# Patient Record
Sex: Female | Born: 1970 | Race: White | Hispanic: No | Marital: Married | Smoking: Former smoker
Health system: Southern US, Community
[De-identification: ages and names within clinical notes are randomized; demographics above are authoritative.]

## PROBLEM LIST (undated history)

## (undated) DIAGNOSIS — T7840XA Allergy, unspecified, initial encounter: Secondary | ICD-10-CM

## (undated) DIAGNOSIS — R102 Pelvic and perineal pain: Secondary | ICD-10-CM

## (undated) DIAGNOSIS — F419 Anxiety disorder, unspecified: Secondary | ICD-10-CM

## (undated) DIAGNOSIS — G43909 Migraine, unspecified, not intractable, without status migrainosus: Secondary | ICD-10-CM

## (undated) DIAGNOSIS — K219 Gastro-esophageal reflux disease without esophagitis: Secondary | ICD-10-CM

## (undated) DIAGNOSIS — L659 Nonscarring hair loss, unspecified: Secondary | ICD-10-CM

## (undated) DIAGNOSIS — Z87442 Personal history of urinary calculi: Secondary | ICD-10-CM

## (undated) DIAGNOSIS — E7439 Other disorders of intestinal carbohydrate absorption: Secondary | ICD-10-CM

## (undated) DIAGNOSIS — K317 Polyp of stomach and duodenum: Secondary | ICD-10-CM

## (undated) DIAGNOSIS — K2900 Acute gastritis without bleeding: Secondary | ICD-10-CM

## (undated) DIAGNOSIS — I10 Essential (primary) hypertension: Secondary | ICD-10-CM

## (undated) DIAGNOSIS — Z87891 Personal history of nicotine dependence: Secondary | ICD-10-CM

## (undated) DIAGNOSIS — R05 Cough: Secondary | ICD-10-CM

## (undated) DIAGNOSIS — N3946 Mixed incontinence: Secondary | ICD-10-CM

## (undated) DIAGNOSIS — N901 Moderate vulvar dysplasia: Secondary | ICD-10-CM

## (undated) DIAGNOSIS — I8393 Asymptomatic varicose veins of bilateral lower extremities: Secondary | ICD-10-CM

## (undated) DIAGNOSIS — R569 Unspecified convulsions: Secondary | ICD-10-CM

## (undated) DIAGNOSIS — M791 Myalgia, unspecified site: Secondary | ICD-10-CM

## (undated) DIAGNOSIS — M67431 Ganglion, right wrist: Secondary | ICD-10-CM

## (undated) DIAGNOSIS — R002 Palpitations: Secondary | ICD-10-CM

## (undated) DIAGNOSIS — M1990 Unspecified osteoarthritis, unspecified site: Secondary | ICD-10-CM

## (undated) DIAGNOSIS — D259 Leiomyoma of uterus, unspecified: Secondary | ICD-10-CM

## (undated) DIAGNOSIS — D649 Anemia, unspecified: Secondary | ICD-10-CM

## (undated) DIAGNOSIS — L989 Disorder of the skin and subcutaneous tissue, unspecified: Secondary | ICD-10-CM

## (undated) HISTORY — DX: Pelvic and perineal pain: R10.2

## (undated) HISTORY — DX: Acute gastritis without bleeding: K29.00

## (undated) HISTORY — DX: Anxiety disorder, unspecified: F41.9

## (undated) HISTORY — PX: TUBAL LIGATION: SHX77

## (undated) HISTORY — DX: Migraine, unspecified, not intractable, without status migrainosus: G43.909

## (undated) HISTORY — DX: Nonscarring hair loss, unspecified: L65.9

## (undated) HISTORY — DX: Polyp of stomach and duodenum: K31.7

## (undated) HISTORY — DX: Unspecified osteoarthritis, unspecified site: M19.90

## (undated) HISTORY — DX: Gastro-esophageal reflux disease without esophagitis: K21.9

## (undated) HISTORY — DX: Essential (primary) hypertension: I10

## (undated) HISTORY — DX: Allergy, unspecified, initial encounter: T78.40XA

## (undated) HISTORY — PX: TONSILLECTOMY: SUR1361

## (undated) HISTORY — DX: Personal history of urinary calculi: Z87.442

## (undated) HISTORY — DX: Anemia, unspecified: D64.9

## (undated) HISTORY — DX: Personal history of nicotine dependence: Z87.891

## (undated) HISTORY — DX: Palpitations: R00.2

## (undated) HISTORY — DX: Myalgia, unspecified site: M79.10

## (undated) HISTORY — DX: Leiomyoma of uterus, unspecified: D25.9

## (undated) HISTORY — DX: Other disorders of intestinal carbohydrate absorption: E74.39

## (undated) HISTORY — DX: Mixed incontinence: N39.46

## (undated) HISTORY — DX: Asymptomatic varicose veins of bilateral lower extremities: I83.93

## (undated) HISTORY — DX: Moderate vulvar dysplasia: N90.1

## (undated) HISTORY — DX: Ganglion, right wrist: M67.431

## (undated) HISTORY — DX: Disorder of the skin and subcutaneous tissue, unspecified: L98.9

## (undated) HISTORY — DX: Unspecified convulsions: R56.9

## (undated) HISTORY — DX: Cough: R05

---

## 1998-03-16 ENCOUNTER — Encounter: Admission: RE | Admit: 1998-03-16 | Discharge: 1998-03-16 | Payer: Self-pay | Admitting: Family Medicine

## 1998-08-19 ENCOUNTER — Emergency Department (HOSPITAL_COMMUNITY): Admission: EM | Admit: 1998-08-19 | Discharge: 1998-08-19 | Payer: Self-pay | Admitting: Emergency Medicine

## 1998-11-07 ENCOUNTER — Encounter: Admission: RE | Admit: 1998-11-07 | Discharge: 1998-11-07 | Payer: Self-pay | Admitting: Family Medicine

## 1999-05-09 ENCOUNTER — Encounter: Admission: RE | Admit: 1999-05-09 | Discharge: 1999-05-09 | Payer: Self-pay | Admitting: Family Medicine

## 1999-05-22 ENCOUNTER — Encounter: Admission: RE | Admit: 1999-05-22 | Discharge: 1999-05-22 | Payer: Self-pay | Admitting: Family Medicine

## 1999-06-24 ENCOUNTER — Encounter: Admission: RE | Admit: 1999-06-24 | Discharge: 1999-06-24 | Payer: Self-pay | Admitting: Family Medicine

## 1999-08-13 ENCOUNTER — Encounter: Admission: RE | Admit: 1999-08-13 | Discharge: 1999-08-13 | Payer: Self-pay | Admitting: Sports Medicine

## 1999-09-04 ENCOUNTER — Encounter: Admission: RE | Admit: 1999-09-04 | Discharge: 1999-09-04 | Payer: Self-pay | Admitting: Family Medicine

## 2000-01-08 ENCOUNTER — Encounter: Admission: RE | Admit: 2000-01-08 | Discharge: 2000-01-08 | Payer: Self-pay | Admitting: Family Medicine

## 2000-01-08 ENCOUNTER — Encounter: Payer: Self-pay | Admitting: Sports Medicine

## 2000-01-08 ENCOUNTER — Encounter: Admission: RE | Admit: 2000-01-08 | Discharge: 2000-01-08 | Payer: Self-pay | Admitting: Sports Medicine

## 2000-01-15 ENCOUNTER — Encounter: Admission: RE | Admit: 2000-01-15 | Discharge: 2000-01-15 | Payer: Self-pay | Admitting: Family Medicine

## 2000-02-05 ENCOUNTER — Encounter: Admission: RE | Admit: 2000-02-05 | Discharge: 2000-02-05 | Payer: Self-pay | Admitting: Family Medicine

## 2000-02-05 ENCOUNTER — Other Ambulatory Visit: Admission: RE | Admit: 2000-02-05 | Discharge: 2000-02-05 | Payer: Self-pay | Admitting: Family Medicine

## 2000-02-26 ENCOUNTER — Encounter: Admission: RE | Admit: 2000-02-26 | Discharge: 2000-02-26 | Payer: Self-pay | Admitting: Family Medicine

## 2000-03-20 ENCOUNTER — Encounter: Admission: RE | Admit: 2000-03-20 | Discharge: 2000-03-20 | Payer: Self-pay

## 2000-03-25 ENCOUNTER — Encounter: Admission: RE | Admit: 2000-03-25 | Discharge: 2000-03-25 | Payer: Self-pay

## 2000-03-31 ENCOUNTER — Encounter: Admission: RE | Admit: 2000-03-31 | Discharge: 2000-03-31 | Payer: Self-pay | Admitting: Family Medicine

## 2000-04-23 ENCOUNTER — Encounter: Admission: RE | Admit: 2000-04-23 | Discharge: 2000-04-23 | Payer: Self-pay | Admitting: Family Medicine

## 2000-06-02 ENCOUNTER — Encounter: Admission: RE | Admit: 2000-06-02 | Discharge: 2000-06-02 | Payer: Self-pay | Admitting: Sports Medicine

## 2000-06-19 ENCOUNTER — Encounter: Admission: RE | Admit: 2000-06-19 | Discharge: 2000-06-19 | Payer: Self-pay | Admitting: Family Medicine

## 2000-09-02 ENCOUNTER — Encounter: Admission: RE | Admit: 2000-09-02 | Discharge: 2000-09-02 | Payer: Self-pay | Admitting: Family Medicine

## 2000-10-08 ENCOUNTER — Encounter: Payer: Self-pay | Admitting: Urology

## 2000-10-08 ENCOUNTER — Encounter: Admission: RE | Admit: 2000-10-08 | Discharge: 2000-10-08 | Payer: Self-pay | Admitting: Urology

## 2000-11-05 ENCOUNTER — Encounter: Admission: RE | Admit: 2000-11-05 | Discharge: 2000-11-05 | Payer: Self-pay | Admitting: Family Medicine

## 2000-11-06 ENCOUNTER — Encounter: Admission: RE | Admit: 2000-11-06 | Discharge: 2000-11-06 | Payer: Self-pay | Admitting: Family Medicine

## 2000-11-13 ENCOUNTER — Encounter: Admission: RE | Admit: 2000-11-13 | Discharge: 2000-11-13 | Payer: Self-pay | Admitting: Family Medicine

## 2000-11-13 ENCOUNTER — Encounter: Payer: Self-pay | Admitting: Sports Medicine

## 2000-11-13 ENCOUNTER — Encounter: Admission: RE | Admit: 2000-11-13 | Discharge: 2000-11-13 | Payer: Self-pay | Admitting: Sports Medicine

## 2000-11-26 ENCOUNTER — Encounter: Admission: RE | Admit: 2000-11-26 | Discharge: 2000-11-26 | Payer: Self-pay | Admitting: Family Medicine

## 2001-01-05 ENCOUNTER — Encounter: Admission: RE | Admit: 2001-01-05 | Discharge: 2001-01-05 | Payer: Self-pay | Admitting: Family Medicine

## 2001-01-12 ENCOUNTER — Encounter: Admission: RE | Admit: 2001-01-12 | Discharge: 2001-01-12 | Payer: Self-pay | Admitting: Family Medicine

## 2001-01-22 ENCOUNTER — Encounter: Admission: RE | Admit: 2001-01-22 | Discharge: 2001-01-22 | Payer: Self-pay | Admitting: Family Medicine

## 2001-01-26 ENCOUNTER — Encounter: Admission: RE | Admit: 2001-01-26 | Discharge: 2001-01-26 | Payer: Self-pay | Admitting: Family Medicine

## 2001-03-31 ENCOUNTER — Encounter: Admission: RE | Admit: 2001-03-31 | Discharge: 2001-03-31 | Payer: Self-pay | Admitting: Family Medicine

## 2001-03-31 ENCOUNTER — Other Ambulatory Visit: Admission: RE | Admit: 2001-03-31 | Discharge: 2001-03-31 | Payer: Self-pay | Admitting: Family Medicine

## 2001-04-29 ENCOUNTER — Encounter: Admission: RE | Admit: 2001-04-29 | Discharge: 2001-04-29 | Payer: Self-pay | Admitting: Obstetrics

## 2001-05-10 ENCOUNTER — Ambulatory Visit (HOSPITAL_COMMUNITY): Admission: RE | Admit: 2001-05-10 | Discharge: 2001-05-10 | Payer: Self-pay | Admitting: Obstetrics

## 2001-05-20 ENCOUNTER — Encounter: Admission: RE | Admit: 2001-05-20 | Discharge: 2001-05-20 | Payer: Self-pay | Admitting: Obstetrics

## 2001-08-19 ENCOUNTER — Encounter: Admission: RE | Admit: 2001-08-19 | Discharge: 2001-08-19 | Payer: Self-pay | Admitting: Obstetrics

## 2001-08-24 ENCOUNTER — Inpatient Hospital Stay (HOSPITAL_COMMUNITY): Admission: AD | Admit: 2001-08-24 | Discharge: 2001-08-24 | Payer: Self-pay | Admitting: *Deleted

## 2001-08-25 ENCOUNTER — Inpatient Hospital Stay (HOSPITAL_COMMUNITY): Admission: AD | Admit: 2001-08-25 | Discharge: 2001-08-25 | Payer: Self-pay | Admitting: Obstetrics

## 2001-08-26 ENCOUNTER — Inpatient Hospital Stay (HOSPITAL_COMMUNITY): Admission: RE | Admit: 2001-08-26 | Discharge: 2001-08-26 | Payer: Self-pay | Admitting: Obstetrics

## 2001-08-26 ENCOUNTER — Encounter: Payer: Self-pay | Admitting: Obstetrics

## 2001-09-01 ENCOUNTER — Inpatient Hospital Stay (HOSPITAL_COMMUNITY): Admission: AD | Admit: 2001-09-01 | Discharge: 2001-09-01 | Payer: Self-pay | Admitting: Obstetrics

## 2001-10-28 ENCOUNTER — Encounter: Admission: RE | Admit: 2001-10-28 | Discharge: 2001-10-28 | Payer: Self-pay | Admitting: Obstetrics

## 2001-11-01 ENCOUNTER — Encounter: Admission: RE | Admit: 2001-11-01 | Discharge: 2001-11-01 | Payer: Self-pay | Admitting: Internal Medicine

## 2001-11-01 ENCOUNTER — Encounter: Payer: Self-pay | Admitting: Internal Medicine

## 2001-11-01 ENCOUNTER — Ambulatory Visit (HOSPITAL_COMMUNITY): Admission: RE | Admit: 2001-11-01 | Discharge: 2001-11-01 | Payer: Self-pay | Admitting: Internal Medicine

## 2001-11-03 ENCOUNTER — Encounter: Payer: Self-pay | Admitting: Emergency Medicine

## 2001-11-03 ENCOUNTER — Emergency Department (HOSPITAL_COMMUNITY): Admission: EM | Admit: 2001-11-03 | Discharge: 2001-11-04 | Payer: Self-pay

## 2001-11-05 ENCOUNTER — Encounter: Admission: RE | Admit: 2001-11-05 | Discharge: 2001-11-05 | Payer: Self-pay | Admitting: Internal Medicine

## 2001-12-28 ENCOUNTER — Encounter: Admission: RE | Admit: 2001-12-28 | Discharge: 2001-12-28 | Payer: Self-pay | Admitting: Family Medicine

## 2002-05-05 ENCOUNTER — Encounter: Admission: RE | Admit: 2002-05-05 | Discharge: 2002-05-05 | Payer: Self-pay | Admitting: Family Medicine

## 2002-08-12 ENCOUNTER — Encounter: Admission: RE | Admit: 2002-08-12 | Discharge: 2002-08-12 | Payer: Self-pay | Admitting: Family Medicine

## 2002-08-19 ENCOUNTER — Encounter: Payer: Self-pay | Admitting: Emergency Medicine

## 2002-08-19 ENCOUNTER — Emergency Department (HOSPITAL_COMMUNITY): Admission: EM | Admit: 2002-08-19 | Discharge: 2002-08-19 | Payer: Self-pay | Admitting: Emergency Medicine

## 2002-11-08 ENCOUNTER — Encounter: Admission: RE | Admit: 2002-11-08 | Discharge: 2002-11-08 | Payer: Self-pay | Admitting: Family Medicine

## 2002-11-25 ENCOUNTER — Encounter: Admission: RE | Admit: 2002-11-25 | Discharge: 2002-11-25 | Payer: Self-pay | Admitting: Family Medicine

## 2002-12-01 ENCOUNTER — Encounter: Admission: RE | Admit: 2002-12-01 | Discharge: 2002-12-01 | Payer: Self-pay | Admitting: Family Medicine

## 2003-01-29 ENCOUNTER — Encounter: Payer: Self-pay | Admitting: Emergency Medicine

## 2003-01-29 ENCOUNTER — Emergency Department (HOSPITAL_COMMUNITY): Admission: EM | Admit: 2003-01-29 | Discharge: 2003-01-29 | Payer: Self-pay | Admitting: Emergency Medicine

## 2003-02-01 ENCOUNTER — Encounter: Admission: RE | Admit: 2003-02-01 | Discharge: 2003-02-01 | Payer: Self-pay | Admitting: Family Medicine

## 2003-02-17 ENCOUNTER — Ambulatory Visit (HOSPITAL_COMMUNITY): Admission: RE | Admit: 2003-02-17 | Discharge: 2003-02-17 | Payer: Self-pay | Admitting: *Deleted

## 2003-02-17 ENCOUNTER — Encounter: Payer: Self-pay | Admitting: *Deleted

## 2003-02-17 ENCOUNTER — Encounter: Admission: RE | Admit: 2003-02-17 | Discharge: 2003-02-17 | Payer: Self-pay | Admitting: Family Medicine

## 2003-04-03 ENCOUNTER — Encounter: Admission: RE | Admit: 2003-04-03 | Discharge: 2003-04-03 | Payer: Self-pay | Admitting: Sports Medicine

## 2003-07-07 ENCOUNTER — Encounter: Admission: RE | Admit: 2003-07-07 | Discharge: 2003-07-07 | Payer: Self-pay | Admitting: Sports Medicine

## 2003-07-30 ENCOUNTER — Encounter: Payer: Self-pay | Admitting: *Deleted

## 2003-07-30 ENCOUNTER — Inpatient Hospital Stay (HOSPITAL_COMMUNITY): Admission: AD | Admit: 2003-07-30 | Discharge: 2003-07-30 | Payer: Self-pay | Admitting: *Deleted

## 2003-08-03 ENCOUNTER — Encounter: Admission: RE | Admit: 2003-08-03 | Discharge: 2003-08-03 | Payer: Self-pay | Admitting: Family Medicine

## 2003-08-14 ENCOUNTER — Encounter: Admission: RE | Admit: 2003-08-14 | Discharge: 2003-08-14 | Payer: Self-pay | Admitting: Family Medicine

## 2003-08-21 ENCOUNTER — Encounter: Admission: RE | Admit: 2003-08-21 | Discharge: 2003-08-21 | Payer: Self-pay | Admitting: Family Medicine

## 2003-08-24 ENCOUNTER — Ambulatory Visit (HOSPITAL_COMMUNITY): Admission: RE | Admit: 2003-08-24 | Discharge: 2003-08-24 | Payer: Self-pay | Admitting: *Deleted

## 2003-09-07 ENCOUNTER — Inpatient Hospital Stay (HOSPITAL_COMMUNITY): Admission: AD | Admit: 2003-09-07 | Discharge: 2003-09-07 | Payer: Self-pay | Admitting: Obstetrics and Gynecology

## 2003-09-19 ENCOUNTER — Encounter: Admission: RE | Admit: 2003-09-19 | Discharge: 2003-09-19 | Payer: Self-pay | Admitting: Family Medicine

## 2003-10-04 ENCOUNTER — Encounter: Admission: RE | Admit: 2003-10-04 | Discharge: 2003-10-04 | Payer: Self-pay | Admitting: *Deleted

## 2003-10-07 DIAGNOSIS — N901 Moderate vulvar dysplasia: Secondary | ICD-10-CM

## 2003-10-07 HISTORY — DX: Moderate vulvar dysplasia: N90.1

## 2003-10-18 ENCOUNTER — Inpatient Hospital Stay (HOSPITAL_COMMUNITY): Admission: AD | Admit: 2003-10-18 | Discharge: 2003-10-18 | Payer: Self-pay | Admitting: Obstetrics and Gynecology

## 2003-10-19 ENCOUNTER — Encounter: Admission: RE | Admit: 2003-10-19 | Discharge: 2003-10-19 | Payer: Self-pay | Admitting: *Deleted

## 2003-10-25 ENCOUNTER — Ambulatory Visit: Admission: RE | Admit: 2003-10-25 | Discharge: 2003-10-25 | Payer: Self-pay | Admitting: Gynecologic Oncology

## 2003-11-02 ENCOUNTER — Ambulatory Visit (HOSPITAL_COMMUNITY): Admission: RE | Admit: 2003-11-02 | Discharge: 2003-11-02 | Payer: Self-pay | Admitting: *Deleted

## 2003-11-06 ENCOUNTER — Inpatient Hospital Stay (HOSPITAL_COMMUNITY): Admission: AD | Admit: 2003-11-06 | Discharge: 2003-11-07 | Payer: Self-pay | Admitting: Obstetrics & Gynecology

## 2003-11-09 ENCOUNTER — Encounter: Admission: RE | Admit: 2003-11-09 | Discharge: 2003-11-09 | Payer: Self-pay | Admitting: Family Medicine

## 2003-11-16 ENCOUNTER — Encounter: Admission: RE | Admit: 2003-11-16 | Discharge: 2003-11-16 | Payer: Self-pay | Admitting: *Deleted

## 2003-11-23 ENCOUNTER — Inpatient Hospital Stay (HOSPITAL_COMMUNITY): Admission: AD | Admit: 2003-11-23 | Discharge: 2003-11-23 | Payer: Self-pay | Admitting: Obstetrics & Gynecology

## 2003-11-26 ENCOUNTER — Inpatient Hospital Stay (HOSPITAL_COMMUNITY): Admission: AD | Admit: 2003-11-26 | Discharge: 2003-11-26 | Payer: Self-pay | Admitting: Obstetrics & Gynecology

## 2003-11-30 ENCOUNTER — Encounter: Admission: RE | Admit: 2003-11-30 | Discharge: 2003-11-30 | Payer: Self-pay | Admitting: *Deleted

## 2003-12-05 ENCOUNTER — Ambulatory Visit (HOSPITAL_COMMUNITY): Admission: RE | Admit: 2003-12-05 | Discharge: 2003-12-05 | Payer: Self-pay | Admitting: Family Medicine

## 2003-12-14 ENCOUNTER — Encounter: Admission: RE | Admit: 2003-12-14 | Discharge: 2003-12-14 | Payer: Self-pay | Admitting: *Deleted

## 2003-12-17 ENCOUNTER — Inpatient Hospital Stay (HOSPITAL_COMMUNITY): Admission: AD | Admit: 2003-12-17 | Discharge: 2003-12-17 | Payer: Self-pay | Admitting: Obstetrics & Gynecology

## 2003-12-20 ENCOUNTER — Ambulatory Visit: Admission: RE | Admit: 2003-12-20 | Discharge: 2003-12-20 | Payer: Self-pay | Admitting: Gynecologic Oncology

## 2003-12-21 ENCOUNTER — Encounter: Admission: RE | Admit: 2003-12-21 | Discharge: 2003-12-21 | Payer: Self-pay | Admitting: Family Medicine

## 2004-01-04 ENCOUNTER — Encounter: Admission: RE | Admit: 2004-01-04 | Discharge: 2004-01-04 | Payer: Self-pay | Admitting: Family Medicine

## 2004-01-04 ENCOUNTER — Ambulatory Visit (HOSPITAL_COMMUNITY): Admission: RE | Admit: 2004-01-04 | Discharge: 2004-01-04 | Payer: Self-pay | Admitting: Family Medicine

## 2004-01-11 ENCOUNTER — Encounter: Admission: RE | Admit: 2004-01-11 | Discharge: 2004-01-11 | Payer: Self-pay | Admitting: Family Medicine

## 2004-01-16 ENCOUNTER — Encounter: Admission: RE | Admit: 2004-01-16 | Discharge: 2004-04-15 | Payer: Self-pay | Admitting: *Deleted

## 2004-01-16 ENCOUNTER — Inpatient Hospital Stay (HOSPITAL_COMMUNITY): Admission: AD | Admit: 2004-01-16 | Discharge: 2004-01-16 | Payer: Self-pay | Admitting: *Deleted

## 2004-01-18 ENCOUNTER — Encounter: Admission: RE | Admit: 2004-01-18 | Discharge: 2004-01-18 | Payer: Self-pay | Admitting: Family Medicine

## 2004-01-25 ENCOUNTER — Encounter: Admission: RE | Admit: 2004-01-25 | Discharge: 2004-01-25 | Payer: Self-pay | Admitting: *Deleted

## 2004-01-29 ENCOUNTER — Inpatient Hospital Stay (HOSPITAL_COMMUNITY): Admission: AD | Admit: 2004-01-29 | Discharge: 2004-01-30 | Payer: Self-pay | Admitting: Obstetrics and Gynecology

## 2004-02-01 ENCOUNTER — Encounter: Admission: RE | Admit: 2004-02-01 | Discharge: 2004-02-01 | Payer: Self-pay | Admitting: *Deleted

## 2004-02-05 ENCOUNTER — Ambulatory Visit (HOSPITAL_COMMUNITY): Admission: RE | Admit: 2004-02-05 | Discharge: 2004-02-05 | Payer: Self-pay | Admitting: *Deleted

## 2004-02-06 ENCOUNTER — Inpatient Hospital Stay (HOSPITAL_COMMUNITY): Admission: AD | Admit: 2004-02-06 | Discharge: 2004-02-06 | Payer: Self-pay | Admitting: *Deleted

## 2004-02-08 ENCOUNTER — Encounter: Admission: RE | Admit: 2004-02-08 | Discharge: 2004-02-08 | Payer: Self-pay | Admitting: Family Medicine

## 2004-02-15 ENCOUNTER — Encounter: Admission: RE | Admit: 2004-02-15 | Discharge: 2004-02-15 | Payer: Self-pay | Admitting: Family Medicine

## 2004-02-16 ENCOUNTER — Inpatient Hospital Stay (HOSPITAL_COMMUNITY): Admission: AD | Admit: 2004-02-16 | Discharge: 2004-02-17 | Payer: Self-pay | Admitting: *Deleted

## 2004-02-22 ENCOUNTER — Encounter: Admission: RE | Admit: 2004-02-22 | Discharge: 2004-02-22 | Payer: Self-pay | Admitting: Family Medicine

## 2004-02-29 ENCOUNTER — Encounter: Admission: RE | Admit: 2004-02-29 | Discharge: 2004-02-29 | Payer: Self-pay | Admitting: Family Medicine

## 2004-03-04 ENCOUNTER — Inpatient Hospital Stay (HOSPITAL_COMMUNITY): Admission: AD | Admit: 2004-03-04 | Discharge: 2004-03-10 | Payer: Self-pay | Admitting: Obstetrics and Gynecology

## 2004-03-07 ENCOUNTER — Encounter (INDEPENDENT_AMBULATORY_CARE_PROVIDER_SITE_OTHER): Payer: Self-pay | Admitting: Specialist

## 2004-03-12 ENCOUNTER — Inpatient Hospital Stay (HOSPITAL_COMMUNITY): Admission: AD | Admit: 2004-03-12 | Discharge: 2004-03-12 | Payer: Self-pay | Admitting: *Deleted

## 2004-03-19 ENCOUNTER — Encounter: Admission: RE | Admit: 2004-03-19 | Discharge: 2004-03-19 | Payer: Self-pay | Admitting: Family Medicine

## 2004-04-16 ENCOUNTER — Ambulatory Visit: Admission: RE | Admit: 2004-04-16 | Discharge: 2004-04-16 | Payer: Self-pay | Admitting: Gynecology

## 2004-04-24 ENCOUNTER — Encounter: Admission: RE | Admit: 2004-04-24 | Discharge: 2004-04-24 | Payer: Self-pay | Admitting: Family Medicine

## 2004-06-11 ENCOUNTER — Ambulatory Visit: Admission: RE | Admit: 2004-06-11 | Discharge: 2004-06-11 | Payer: Self-pay | Admitting: Gynecologic Oncology

## 2004-06-26 ENCOUNTER — Ambulatory Visit: Payer: Self-pay | Admitting: Family Medicine

## 2004-06-28 ENCOUNTER — Ambulatory Visit (HOSPITAL_COMMUNITY): Admission: RE | Admit: 2004-06-28 | Discharge: 2004-06-28 | Payer: Self-pay | Admitting: Sports Medicine

## 2004-07-08 ENCOUNTER — Ambulatory Visit (HOSPITAL_COMMUNITY): Admission: RE | Admit: 2004-07-08 | Discharge: 2004-07-08 | Payer: Self-pay | Admitting: Gynecologic Oncology

## 2004-07-08 ENCOUNTER — Encounter (INDEPENDENT_AMBULATORY_CARE_PROVIDER_SITE_OTHER): Payer: Self-pay | Admitting: *Deleted

## 2004-07-22 ENCOUNTER — Ambulatory Visit: Payer: Self-pay | Admitting: Sports Medicine

## 2004-08-06 ENCOUNTER — Ambulatory Visit: Admission: RE | Admit: 2004-08-06 | Discharge: 2004-08-06 | Payer: Self-pay | Admitting: Gynecologic Oncology

## 2004-08-06 HISTORY — PX: VULVECTOMY PARTIAL: SHX6187

## 2004-09-20 ENCOUNTER — Ambulatory Visit: Payer: Self-pay | Admitting: Family Medicine

## 2004-10-08 ENCOUNTER — Ambulatory Visit: Payer: Self-pay | Admitting: Family Medicine

## 2004-10-21 ENCOUNTER — Ambulatory Visit: Payer: Self-pay | Admitting: Sports Medicine

## 2004-12-11 ENCOUNTER — Ambulatory Visit: Payer: Self-pay | Admitting: Family Medicine

## 2005-02-21 ENCOUNTER — Encounter: Admission: RE | Admit: 2005-02-21 | Discharge: 2005-02-21 | Payer: Self-pay | Admitting: Sports Medicine

## 2005-02-21 ENCOUNTER — Ambulatory Visit: Payer: Self-pay | Admitting: Family Medicine

## 2005-03-10 ENCOUNTER — Ambulatory Visit: Payer: Self-pay | Admitting: Sports Medicine

## 2005-05-01 ENCOUNTER — Ambulatory Visit: Payer: Self-pay | Admitting: Family Medicine

## 2005-05-22 ENCOUNTER — Encounter (INDEPENDENT_AMBULATORY_CARE_PROVIDER_SITE_OTHER): Payer: Self-pay | Admitting: Specialist

## 2005-05-22 ENCOUNTER — Ambulatory Visit: Payer: Self-pay | Admitting: Family Medicine

## 2005-06-19 ENCOUNTER — Ambulatory Visit: Payer: Self-pay | Admitting: Family Medicine

## 2005-07-21 ENCOUNTER — Ambulatory Visit: Payer: Self-pay | Admitting: Family Medicine

## 2005-09-10 ENCOUNTER — Ambulatory Visit: Payer: Self-pay | Admitting: Family Medicine

## 2005-12-04 ENCOUNTER — Encounter (INDEPENDENT_AMBULATORY_CARE_PROVIDER_SITE_OTHER): Payer: Self-pay | Admitting: *Deleted

## 2005-12-04 LAB — CONVERTED CEMR LAB

## 2005-12-15 ENCOUNTER — Encounter: Payer: Self-pay | Admitting: Family Medicine

## 2005-12-15 ENCOUNTER — Ambulatory Visit: Payer: Self-pay | Admitting: Family Medicine

## 2006-01-14 ENCOUNTER — Encounter: Admission: RE | Admit: 2006-01-14 | Discharge: 2006-01-14 | Payer: Self-pay | Admitting: Gastroenterology

## 2006-01-19 ENCOUNTER — Ambulatory Visit: Payer: Self-pay | Admitting: Family Medicine

## 2006-01-23 ENCOUNTER — Encounter: Payer: Self-pay | Admitting: Family Medicine

## 2006-02-05 ENCOUNTER — Encounter: Payer: Self-pay | Admitting: Family Medicine

## 2006-02-12 ENCOUNTER — Ambulatory Visit: Payer: Self-pay | Admitting: Family Medicine

## 2006-02-17 ENCOUNTER — Encounter: Admission: RE | Admit: 2006-02-17 | Discharge: 2006-02-17 | Payer: Self-pay | Admitting: Family Medicine

## 2006-03-19 ENCOUNTER — Ambulatory Visit: Payer: Self-pay | Admitting: Family Medicine

## 2006-03-23 ENCOUNTER — Encounter: Admission: RE | Admit: 2006-03-23 | Discharge: 2006-03-23 | Payer: Self-pay | Admitting: Sports Medicine

## 2006-04-16 ENCOUNTER — Ambulatory Visit: Payer: Self-pay | Admitting: Family Medicine

## 2006-06-15 ENCOUNTER — Ambulatory Visit: Payer: Self-pay | Admitting: Family Medicine

## 2006-07-13 ENCOUNTER — Ambulatory Visit: Payer: Self-pay | Admitting: Family Medicine

## 2006-08-13 ENCOUNTER — Ambulatory Visit: Payer: Self-pay | Admitting: Family Medicine

## 2006-10-12 ENCOUNTER — Ambulatory Visit: Payer: Self-pay | Admitting: Family Medicine

## 2006-12-03 DIAGNOSIS — K219 Gastro-esophageal reflux disease without esophagitis: Secondary | ICD-10-CM

## 2006-12-03 DIAGNOSIS — Z87442 Personal history of urinary calculi: Secondary | ICD-10-CM | POA: Insufficient documentation

## 2006-12-03 DIAGNOSIS — J309 Allergic rhinitis, unspecified: Secondary | ICD-10-CM | POA: Insufficient documentation

## 2006-12-03 DIAGNOSIS — E7439 Other disorders of intestinal carbohydrate absorption: Secondary | ICD-10-CM

## 2006-12-03 DIAGNOSIS — N920 Excessive and frequent menstruation with regular cycle: Secondary | ICD-10-CM | POA: Insufficient documentation

## 2006-12-03 DIAGNOSIS — G43909 Migraine, unspecified, not intractable, without status migrainosus: Secondary | ICD-10-CM | POA: Insufficient documentation

## 2006-12-03 DIAGNOSIS — R8789 Other abnormal findings in specimens from female genital organs: Secondary | ICD-10-CM | POA: Insufficient documentation

## 2006-12-03 DIAGNOSIS — I10 Essential (primary) hypertension: Secondary | ICD-10-CM | POA: Insufficient documentation

## 2006-12-03 HISTORY — DX: Gastro-esophageal reflux disease without esophagitis: K21.9

## 2006-12-03 HISTORY — DX: Other disorders of intestinal carbohydrate absorption: E74.39

## 2006-12-03 HISTORY — DX: Migraine, unspecified, not intractable, without status migrainosus: G43.909

## 2006-12-03 HISTORY — DX: Personal history of urinary calculi: Z87.442

## 2006-12-04 ENCOUNTER — Encounter (INDEPENDENT_AMBULATORY_CARE_PROVIDER_SITE_OTHER): Payer: Self-pay | Admitting: *Deleted

## 2007-01-11 ENCOUNTER — Telehealth (INDEPENDENT_AMBULATORY_CARE_PROVIDER_SITE_OTHER): Payer: Self-pay | Admitting: *Deleted

## 2007-01-11 ENCOUNTER — Emergency Department (HOSPITAL_COMMUNITY): Admission: EM | Admit: 2007-01-11 | Discharge: 2007-01-12 | Payer: Self-pay | Admitting: Emergency Medicine

## 2007-01-11 ENCOUNTER — Ambulatory Visit: Payer: Self-pay | Admitting: Sports Medicine

## 2007-01-12 ENCOUNTER — Telehealth: Payer: Self-pay | Admitting: Family Medicine

## 2007-01-14 ENCOUNTER — Ambulatory Visit: Payer: Self-pay | Admitting: Family Medicine

## 2007-02-04 ENCOUNTER — Telehealth: Payer: Self-pay | Admitting: *Deleted

## 2007-02-04 ENCOUNTER — Ambulatory Visit: Payer: Self-pay | Admitting: Family Medicine

## 2007-02-04 ENCOUNTER — Other Ambulatory Visit: Admission: RE | Admit: 2007-02-04 | Discharge: 2007-02-04 | Payer: Self-pay | Admitting: Family Medicine

## 2007-02-04 ENCOUNTER — Encounter: Payer: Self-pay | Admitting: Family Medicine

## 2007-02-04 ENCOUNTER — Encounter (INDEPENDENT_AMBULATORY_CARE_PROVIDER_SITE_OTHER): Payer: Self-pay | Admitting: Specialist

## 2007-02-05 ENCOUNTER — Ambulatory Visit (HOSPITAL_COMMUNITY): Admission: RE | Admit: 2007-02-05 | Discharge: 2007-02-05 | Payer: Self-pay | Admitting: Family Medicine

## 2007-02-08 ENCOUNTER — Encounter: Payer: Self-pay | Admitting: Family Medicine

## 2007-02-08 DIAGNOSIS — D259 Leiomyoma of uterus, unspecified: Secondary | ICD-10-CM | POA: Insufficient documentation

## 2007-02-08 HISTORY — DX: Leiomyoma of uterus, unspecified: D25.9

## 2007-03-25 ENCOUNTER — Telehealth (INDEPENDENT_AMBULATORY_CARE_PROVIDER_SITE_OTHER): Payer: Self-pay | Admitting: *Deleted

## 2007-03-25 ENCOUNTER — Other Ambulatory Visit: Admission: RE | Admit: 2007-03-25 | Discharge: 2007-03-25 | Payer: Self-pay | Admitting: Obstetrics & Gynecology

## 2007-03-29 ENCOUNTER — Encounter: Payer: Self-pay | Admitting: Family Medicine

## 2007-04-13 ENCOUNTER — Encounter: Payer: Self-pay | Admitting: Family Medicine

## 2007-07-19 ENCOUNTER — Encounter: Payer: Self-pay | Admitting: Family Medicine

## 2007-07-20 ENCOUNTER — Telehealth: Payer: Self-pay | Admitting: Family Medicine

## 2007-08-04 ENCOUNTER — Encounter: Payer: Self-pay | Admitting: *Deleted

## 2007-08-04 ENCOUNTER — Ambulatory Visit: Payer: Self-pay | Admitting: Family Medicine

## 2007-08-09 ENCOUNTER — Encounter: Payer: Self-pay | Admitting: Family Medicine

## 2007-09-10 ENCOUNTER — Telehealth: Payer: Self-pay | Admitting: *Deleted

## 2007-09-10 ENCOUNTER — Ambulatory Visit: Payer: Self-pay | Admitting: Family Medicine

## 2007-11-21 ENCOUNTER — Telehealth (INDEPENDENT_AMBULATORY_CARE_PROVIDER_SITE_OTHER): Payer: Self-pay | Admitting: *Deleted

## 2008-01-20 ENCOUNTER — Telehealth: Payer: Self-pay | Admitting: *Deleted

## 2008-02-15 ENCOUNTER — Ambulatory Visit: Payer: Self-pay | Admitting: Family Medicine

## 2008-02-15 ENCOUNTER — Telehealth: Payer: Self-pay | Admitting: *Deleted

## 2008-02-15 DIAGNOSIS — R1032 Left lower quadrant pain: Secondary | ICD-10-CM | POA: Insufficient documentation

## 2008-02-15 LAB — CONVERTED CEMR LAB
Nitrite: NEGATIVE
Protein, U semiquant: NEGATIVE
Specific Gravity, Urine: 1.025
Urobilinogen, UA: 0.2
WBC Urine, dipstick: NEGATIVE

## 2008-03-06 ENCOUNTER — Ambulatory Visit: Payer: Self-pay | Admitting: Family Medicine

## 2008-03-06 LAB — CONVERTED CEMR LAB: Beta hcg, urine, semiquantitative: NEGATIVE

## 2008-06-30 ENCOUNTER — Ambulatory Visit: Payer: Self-pay | Admitting: Family Medicine

## 2008-07-31 ENCOUNTER — Ambulatory Visit (HOSPITAL_COMMUNITY): Admission: RE | Admit: 2008-07-31 | Discharge: 2008-07-31 | Payer: Self-pay | Admitting: Family Medicine

## 2008-07-31 ENCOUNTER — Ambulatory Visit: Payer: Self-pay | Admitting: Family Medicine

## 2008-07-31 DIAGNOSIS — R002 Palpitations: Secondary | ICD-10-CM | POA: Insufficient documentation

## 2008-07-31 HISTORY — DX: Palpitations: R00.2

## 2008-07-31 LAB — CONVERTED CEMR LAB
ALT: 17 units/L (ref 0–35)
Alkaline Phosphatase: 97 units/L (ref 39–117)
CO2: 23 meq/L (ref 19–32)
Cholesterol: 167 mg/dL (ref 0–200)
LDL Cholesterol: 91 mg/dL (ref 0–99)
MCV: 87.6 fL (ref 78.0–100.0)
Platelets: 299 10*3/uL (ref 150–400)
Sodium: 139 meq/L (ref 135–145)
Total Bilirubin: 0.6 mg/dL (ref 0.3–1.2)
Total Protein: 7 g/dL (ref 6.0–8.3)
VLDL: 27 mg/dL (ref 0–40)
WBC: 8.5 10*3/uL (ref 4.0–10.5)

## 2008-08-01 ENCOUNTER — Telehealth: Payer: Self-pay | Admitting: Family Medicine

## 2008-08-01 ENCOUNTER — Encounter: Payer: Self-pay | Admitting: Family Medicine

## 2008-08-04 ENCOUNTER — Encounter: Payer: Self-pay | Admitting: Family Medicine

## 2008-08-07 ENCOUNTER — Ambulatory Visit: Payer: Self-pay | Admitting: Family Medicine

## 2008-08-10 ENCOUNTER — Encounter: Payer: Self-pay | Admitting: Family Medicine

## 2008-08-17 ENCOUNTER — Ambulatory Visit: Payer: Self-pay | Admitting: Family Medicine

## 2008-09-18 ENCOUNTER — Encounter: Payer: Self-pay | Admitting: Family Medicine

## 2008-09-21 ENCOUNTER — Encounter: Payer: Self-pay | Admitting: Family Medicine

## 2008-11-02 ENCOUNTER — Encounter: Payer: Self-pay | Admitting: Obstetrics & Gynecology

## 2008-11-02 ENCOUNTER — Ambulatory Visit: Payer: Self-pay | Admitting: Obstetrics and Gynecology

## 2008-11-02 ENCOUNTER — Encounter: Payer: Self-pay | Admitting: Obstetrics and Gynecology

## 2008-11-02 LAB — CONVERTED CEMR LAB
RBC: 5.26 M/uL — ABNORMAL HIGH (ref 3.87–5.11)
WBC: 11.9 10*3/uL — ABNORMAL HIGH (ref 4.0–10.5)

## 2008-11-08 ENCOUNTER — Encounter: Payer: Self-pay | Admitting: Family Medicine

## 2008-12-15 ENCOUNTER — Ambulatory Visit (HOSPITAL_COMMUNITY): Admission: RE | Admit: 2008-12-15 | Discharge: 2008-12-15 | Payer: Self-pay | Admitting: Family Medicine

## 2008-12-15 ENCOUNTER — Telehealth (INDEPENDENT_AMBULATORY_CARE_PROVIDER_SITE_OTHER): Payer: Self-pay | Admitting: *Deleted

## 2008-12-15 ENCOUNTER — Telehealth (INDEPENDENT_AMBULATORY_CARE_PROVIDER_SITE_OTHER): Payer: Self-pay | Admitting: Family Medicine

## 2008-12-15 ENCOUNTER — Telehealth: Payer: Self-pay | Admitting: Family Medicine

## 2008-12-15 ENCOUNTER — Ambulatory Visit: Payer: Self-pay | Admitting: Family Medicine

## 2008-12-27 ENCOUNTER — Encounter: Payer: Self-pay | Admitting: Family Medicine

## 2009-05-09 ENCOUNTER — Emergency Department (HOSPITAL_COMMUNITY): Admission: EM | Admit: 2009-05-09 | Discharge: 2009-05-10 | Payer: Self-pay | Admitting: Emergency Medicine

## 2009-05-11 ENCOUNTER — Encounter: Payer: Self-pay | Admitting: Family Medicine

## 2009-05-11 LAB — CONVERTED CEMR LAB
Hemoglobin: 14.2 g/dL
Potassium: 4.1 meq/L
Sodium: 136 meq/L
WBC: 9.8 10*3/uL

## 2009-05-22 ENCOUNTER — Telehealth: Payer: Self-pay | Admitting: Family Medicine

## 2009-07-09 ENCOUNTER — Ambulatory Visit: Payer: Self-pay | Admitting: Family Medicine

## 2009-08-23 ENCOUNTER — Emergency Department (HOSPITAL_COMMUNITY): Admission: EM | Admit: 2009-08-23 | Discharge: 2009-08-23 | Payer: Self-pay | Admitting: Emergency Medicine

## 2009-08-23 ENCOUNTER — Telehealth: Payer: Self-pay | Admitting: Family Medicine

## 2009-08-26 ENCOUNTER — Emergency Department: Payer: Self-pay | Admitting: Emergency Medicine

## 2009-08-26 ENCOUNTER — Encounter: Payer: Self-pay | Admitting: Family Medicine

## 2009-08-26 LAB — CONVERTED CEMR LAB
BUN: 8 mg/dL
Calcium: 3.4 mg/dL
Chloride: 102 meq/L
Glucose, Bld: 104 mg/dL
HCT: 44.8 %
Hemoglobin: 15.2 g/dL
Ketones, ur: NEGATIVE mg/dL
Leukocytes, UA: NEGATIVE
Platelets: 310 10*3/uL
Potassium: 3.9 meq/L
Specific Gravity, Urine: 1.015
WBC: 11.1 10*3/uL — ABNORMAL HIGH
pH: 7

## 2009-08-27 ENCOUNTER — Telehealth: Payer: Self-pay | Admitting: Family Medicine

## 2009-08-28 ENCOUNTER — Encounter: Payer: Self-pay | Admitting: Family Medicine

## 2009-08-28 ENCOUNTER — Encounter: Payer: Self-pay | Admitting: *Deleted

## 2009-09-10 ENCOUNTER — Encounter: Payer: Self-pay | Admitting: Family Medicine

## 2009-09-27 ENCOUNTER — Ambulatory Visit: Payer: Self-pay | Admitting: Family Medicine

## 2009-10-18 ENCOUNTER — Ambulatory Visit (HOSPITAL_COMMUNITY): Admission: RE | Admit: 2009-10-18 | Discharge: 2009-10-18 | Payer: Self-pay | Admitting: Family Medicine

## 2009-10-18 ENCOUNTER — Ambulatory Visit: Payer: Self-pay | Admitting: Family Medicine

## 2009-10-18 LAB — CONVERTED CEMR LAB
ALT: 17 units/L (ref 0–35)
AST: 19 units/L (ref 0–37)
Alkaline Phosphatase: 103 units/L (ref 39–117)
CO2: 24 meq/L (ref 19–32)
LDL Cholesterol: 79 mg/dL (ref 0–99)
MCHC: 32.4 g/dL (ref 30.0–36.0)
MCV: 86.6 fL (ref 78.0–100.0)
Platelets: 357 10*3/uL (ref 150–400)
RDW: 14 % (ref 11.5–15.5)
Sodium: 137 meq/L (ref 135–145)
Total Bilirubin: 0.7 mg/dL (ref 0.3–1.2)
Total CHOL/HDL Ratio: 3.1
Total Protein: 7.3 g/dL (ref 6.0–8.3)
VLDL: 15 mg/dL (ref 0–40)
WBC: 7.9 10*3/uL (ref 4.0–10.5)

## 2009-10-19 ENCOUNTER — Encounter: Payer: Self-pay | Admitting: Family Medicine

## 2009-11-30 ENCOUNTER — Emergency Department (HOSPITAL_COMMUNITY): Admission: EM | Admit: 2009-11-30 | Discharge: 2009-11-30 | Payer: Self-pay | Admitting: Family Medicine

## 2009-12-06 ENCOUNTER — Ambulatory Visit: Payer: Self-pay | Admitting: Family Medicine

## 2009-12-06 LAB — CONVERTED CEMR LAB: Pap Smear: NEGATIVE

## 2009-12-07 ENCOUNTER — Telehealth: Payer: Self-pay | Admitting: *Deleted

## 2009-12-11 ENCOUNTER — Ambulatory Visit: Payer: Self-pay | Admitting: Family Medicine

## 2009-12-11 DIAGNOSIS — Z87891 Personal history of nicotine dependence: Secondary | ICD-10-CM

## 2009-12-11 HISTORY — DX: Personal history of nicotine dependence: Z87.891

## 2009-12-14 ENCOUNTER — Encounter: Payer: Self-pay | Admitting: Family Medicine

## 2010-04-09 ENCOUNTER — Emergency Department (HOSPITAL_COMMUNITY): Admission: EM | Admit: 2010-04-09 | Discharge: 2010-04-09 | Payer: Self-pay | Admitting: Family Medicine

## 2010-09-04 ENCOUNTER — Ambulatory Visit (HOSPITAL_COMMUNITY)
Admission: RE | Admit: 2010-09-04 | Discharge: 2010-09-04 | Payer: Self-pay | Source: Home / Self Care | Admitting: Family Medicine

## 2010-09-04 ENCOUNTER — Ambulatory Visit: Payer: Self-pay | Admitting: Family Medicine

## 2010-09-04 ENCOUNTER — Encounter: Payer: Self-pay | Admitting: Family Medicine

## 2010-09-04 DIAGNOSIS — R0789 Other chest pain: Secondary | ICD-10-CM | POA: Insufficient documentation

## 2010-09-04 DIAGNOSIS — R079 Chest pain, unspecified: Secondary | ICD-10-CM | POA: Insufficient documentation

## 2010-09-12 ENCOUNTER — Ambulatory Visit: Payer: Self-pay | Admitting: Family Medicine

## 2010-09-12 ENCOUNTER — Encounter: Payer: Self-pay | Admitting: Family Medicine

## 2010-09-12 ENCOUNTER — Ambulatory Visit (HOSPITAL_COMMUNITY)
Admission: RE | Admit: 2010-09-12 | Discharge: 2010-09-12 | Payer: Self-pay | Source: Home / Self Care | Attending: Family Medicine | Admitting: Family Medicine

## 2010-09-12 DIAGNOSIS — J111 Influenza due to unidentified influenza virus with other respiratory manifestations: Secondary | ICD-10-CM | POA: Insufficient documentation

## 2010-10-14 ENCOUNTER — Encounter: Payer: Self-pay | Admitting: Family Medicine

## 2010-11-05 NOTE — Letter (Signed)
Summary: [facility]  [facility]   Imported By: Clydell Hakim 02/14/2010 11:21:31  _____________________________________________________________________  External Attachment:    Type:   Image     Comment:   External Document
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
## 2010-11-05 NOTE — Letter (Signed)
Summary: [facility]  [address]   [place], [place] [address]   Phone: [phone]  Fax: [phone]    12/14/2009  ELANDA GARMANY [address] [place], [place]  [address]  Dear Ms. Donahoe,   Your pap smear wasnormal. We will see you in [facility] clinic in 6 months for a repeat extenral exam. We willnot have to repeat the pap at that visot.        Sincerely,   Denny Levy MD  Appended Document: LABLetter pt letter mailed

## 2010-11-05 NOTE — Letter (Signed)
Summary: Out of Work  [facility]  [address]   [place], [place] [address]   Phone: [phone]  Fax: [phone]    September 12, 2010   Employee:  Carla Berry    To Whom It May Concern:   For Medical reasons, please excuse the above named employee from work for the following dates:  Start:   Dec 8th    End:   Dec 12th  If you need additional information, please feel free to contact our office.         Sincerely,    Clementeen Graham MD

## 2010-11-05 NOTE — Assessment & Plan Note (Signed)
Summary: PAP/tcb   Vital Signs:  Patient profile:   40 year old female Height:      63.5 inches Weight:      238.8 pounds BMI:     41.79 Temp:     98.2 degrees F rectal Pulse rate:   73 / minute BP sitting:   134 / 97  (left arm) Cuff size:   large  Vitals Entered By: Gladstone Pih (December 06, 2009 10:16 AM) CC: PAP, C/O vaginal itching Is Patient Diabetic? No Pain Assessment Patient in pain? no        Primary Care Provider:  TODD MCDIARMID MD  CC:  PAP and C/O vaginal itching.  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 Was treated with aldara for almost a year then had a 3x3 cm vulvectomy located at perineum and posterior forchette. LAst seen by GYN last year, here for continued follow up.  Habits & Providers  Alcohol-Tobacco-Diet     Tobacco Status: never  Current Medications (verified): 1)  Nexium 40 Mg Cpdr (Esomeprazole Magnesium) .... Take 1 Capsule By Mouth One Hour Before Largest Meal of The Day 2)  Hydrocortisone 2.5% Crea, Clotrimazole 1% Crea, Emollient .... Mix 1:1:1 Apply To Affected Area As Directed.  Disp 1 Pound.  Allergies: 1)  ! Codeine  Review of Systems       no vulvar burning or bleeding.  Physical Exam  General:  alert and well-developed.   Genitalia:  perineum shows no abnormal appearing skin. Colposcopic exam done using acetic acid, green filter and lugols solution and no changes noted.   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 review of her records previously from gyn shows recommendation for either 6 m or 12 m follow up cycle. I will leave that o her, but certainly have her return sooner with any new symptoms. Rputine pa performed today and  recommend repeat 1 year   Complete Medication List: 1)  Nexium 40 Mg Cpdr (Esomeprazole magnesium) .... Take 1 capsule by mouth one hour before largest meal of the day 2)  Hydrocortisone 2.5% Crea, Clotrimazole 1% Crea, Emollient  .... Mix 1:1:1 apply to affected area as directed.  disp 1 pound. Pap Smear-[facility] ([id number]) Colposcopy w/out biopsy [facility] ([id number]) Prescriptions: HYDROCORTISONE 2.5% CREA, CLOTRIMAZOLE 1% CREA, EMOLLIENT Mix 1:1:1 apply to affected area as directed.  Disp 1 pound.  #1 x 0   Entered by:   Romero Belling MD   Authorized by:   Denny Levy MD   Signed by:   Romero Belling MD on 12/06/2009   Method used:   Print then Give to Patient   RxID:   [id number] FLUCONAZOLE 150 MG TABS (FLUCONAZOLE) 1 tab by mouth x1 now for yeast infeciton.  #1 x 0   Entered by:   Romero Belling MD   Authorized by:   Denny Levy MD   Signed by:   Romero Belling MD on 12/06/2009   Method used:   Print then Give to Patient   RxID:   [id number]     Impression & Recommendations:  Orders: Colposcopy w/out biopsy -[facility] ([id number])   Complete Medication List: 1)  Nexium 40 Mg Cpdr (Esomeprazole magnesium) .... Take 1 capsule by mouth one hour before largest meal  of the day 2)  Hydrocortisone 2.5% Crea, Clotrimazole 1% Crea, Emollient  .... Mix 1:1:1 apply to affected area as directed.  disp 1 pound.  Other Orders: Pap Smear-[facility] ([id number])
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
## 2010-11-05 NOTE — Progress Notes (Signed)
Summary: meds prob  Medications Added CLOTRIMAZOLE-BETAMETHASONE 1-0.05 % CREA (CLOTRIMAZOLE-BETAMETHASONE) apply as directed to GU area once daily or two times a day disp 30 gram tube NYSTATIN-TRIAMCINOLONE 100000-0.1 UNIT/GM-% CREA (NYSTATIN-TRIAMCINOLONE) disp 60 g tube for 3 m supply       Phone Note Call from Patient Call back at [phone] or [phone]   Caller: Patient Summary of Call: the only place she found the compound was $300 and cannot afford that - pls advise Initial call taken by: De Nurse,  December 07, 2009 8:40 AM  Follow-up for Phone Call        triage OK I have rx a different rx and sent it [facility]. I checked and the [facility] has this alrady made---tell her actually this is probably a better formulation for her-- Thanks!  Denny Levy MD  December 07, 2009 11:53 AM   Additional Follow-up for Phone Call Additional follow up Details #1::        informed pt. Additional Follow-up by: Golden Circle RN,  December 07, 2009 11:56 AM    New/Updated Medications: CLOTRIMAZOLE-BETAMETHASONE 1-0.05 % CREA (CLOTRIMAZOLE-BETAMETHASONE) apply as directed to GU area once daily or two times a day disp 30 gram tube NYSTATIN-TRIAMCINOLONE 100000-0.1 UNIT/GM-% CREA (NYSTATIN-TRIAMCINOLONE) disp 60 g tube for 3 m supply Prescriptions: NYSTATIN-TRIAMCINOLONE 100000-0.1 UNIT/GM-% CREA (NYSTATIN-TRIAMCINOLONE) disp 60 g tube for 3 m supply  #60 x 3   Entered and Authorized by:   Denny Levy MD   Signed by:   Denny Levy MD on 12/07/2009   Method used:   Print then Give to Patient   RxID:   [id number] CLOTRIMAZOLE-BETAMETHASONE 1-0.05 % CREA (CLOTRIMAZOLE-BETAMETHASONE) apply as directed to GU area once daily or two times a day disp 30 gram tube  #30g x 3   Entered and Authorized by:   Denny Levy MD   Signed by:   Denny Levy MD on 12/07/2009   Method used:   Print then Give to Patient   RxID:   [id number]

## 2010-11-05 NOTE — Letter (Signed)
Summary: Lab results letter to patient  [facility]  [address]   [place], [place] [address]   Phone: [phone]  Fax: [phone]    10/19/2009 MRN: [id number]  Nancie Bocanegra [address] [place], [place]  [address]  Dear Kenney Houseman:  I reviewed your last lipid profile from 10/18/2009 and the results are noted below with a summary of recommendations for lipid management.    Cholesterol:       139     Goal: <200   HDL "good" Cholesterol:   45     Goal: >40   LDL "bad" Cholesterol:   79     Goal: <160   Triglycerides:     77     Goal: <150  Your cholesterol panel looks good.    Your blood sugar test again shows that you are at risk of developing diabetes.    I like the idea of you coming weekly to the [facility] for a weight check as you work on your eating habits. Leave me a phone message at the [facility] if you do decide to start this weekly weigh in with [place].  I will arrange it with our nursing staff.   Below are general healthy eating recommendations for everyone, including patients at risk for diabetes.       TLC Diet (Therapeutic Lifestyle Change): Saturated Fats & Transfatty acids should be kept < 7% of total calories ***Reduce Saturated Fats Polyunstaurated Fat can be up to 10% of total calories Monounsaturated Fat Fat can be up to 20% of total calories Total Fat should be no greater than 25-35% of total calories Carbohydrates should be 50-60% of total calories Protein should be approximately 15% of total calories Fiber should be at least 20-30 grams a day ***Increased fiber may help lower LDL Total Cholesterol should be < 200mg /day Consider adding plant stanol/sterols to diet (example: Benacol spread) ***A higher intake of unsaturated fat may reduce Triglycerides and Increase HDL    Adjunctive Measures (may lower LIPIDS and reduce risk of Heart Attack) include: Aerobic Exercise (20-30 minutes 3-4 times a week) Limit  Alcohol Consumption Weight Reduction of 5 to 10 pounds Folic Acid 1mg  a day by mouth Dietary Fiber 20-30 grams a day by mouth     Current Medications: 1)    Nexium 40 Mg Cpdr (Esomeprazole magnesium) .... Take 1 capsule by mouth one hour before largest meal of the day   Sincerely,   Tawanna Cooler Adhrit Krenz MD [facility]  Appended Document: Lab results letter to patient mailed
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
## 2010-11-05 NOTE — Letter (Signed)
Summary: [facility]   Imported By: Clydell Hakim 02/14/2010 11:22:17  _____________________________________________________________________  External Attachment:    Type:   Image     Comment:   External Document
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
## 2010-11-07 NOTE — Miscellaneous (Signed)
Summary: Infuenza Vaccination Documentation   Clinical Lists Changes  Observations: Added new observation of FLU VAX: Historical (07/12/2010 9:43)      Influenza Immunization History:    Influenza # 1:  Historical (07/12/2010)

## 2010-11-11 ENCOUNTER — Encounter: Payer: Self-pay | Admitting: *Deleted

## 2011-01-08 ENCOUNTER — Other Ambulatory Visit: Payer: Self-pay | Admitting: Family Medicine

## 2011-01-08 MED ORDER — ESOMEPRAZOLE MAGNESIUM 40 MG PO CPDR: 40.0000 mg | DELAYED_RELEASE_CAPSULE | Freq: Every day | ORAL | Status: DC

## 2011-01-11 LAB — CBC
Platelets: 283 10*3/uL (ref 150–400)
RDW: 13.8 % (ref 11.5–15.5)
WBC: 9.8 10*3/uL (ref 4.0–10.5)

## 2011-01-11 LAB — DIFFERENTIAL
Basophils Absolute: 0.1 10*3/uL (ref 0.0–0.1)
Eosinophils Relative: 1 % (ref 0–5)
Lymphocytes Relative: 30 % (ref 12–46)
Monocytes Absolute: 0.9 10*3/uL (ref 0.1–1.0)
Monocytes Relative: 9 % (ref 3–12)
Neutro Abs: 5.8 10*3/uL (ref 1.7–7.7)

## 2011-01-11 LAB — COMPREHENSIVE METABOLIC PANEL
AST: 28 U/L (ref 0–37)
Albumin: 3.5 g/dL (ref 3.5–5.2)
Alkaline Phosphatase: 98 U/L (ref 39–117)
BUN: 12 mg/dL (ref 6–23)
Chloride: 104 mEq/L (ref 96–112)
Creatinine, Ser: 1 mg/dL (ref 0.4–1.2)
GFR calc Af Amer: >60 mL/min (ref >60)
Potassium: 4.1 mEq/L (ref 3.5–5.1)
Total Bilirubin: 0.4 mg/dL (ref 0.3–1.2)
Total Protein: 6.5 g/dL (ref 6.0–8.3)

## 2011-01-11 LAB — URINALYSIS, ROUTINE W REFLEX MICROSCOPIC
Ketones, ur: NEGATIVE mg/dL
Protein, ur: NEGATIVE mg/dL
Urobilinogen, UA: 1 mg/dL (ref 0.0–1.0)
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
## 2011-02-21 NOTE — Consult Note (Signed)
NAME:  Carla Berry, Carla Berry NO.:  [id number]   MEDICAL RECORD NO.:  [id number]          PATIENT TYPE:  OUT   LOCATION:  GYN                          FACILITY:  [facility]   PHYSICIAN:  John T. Kyla Balzarine, M.D.    DATE OF BIRTH:  1971-02-08   DATE OF CONSULTATION:  08/06/2004  DATE OF DISCHARGE:                                   CONSULTATION   CHIEF COMPLAINT:  Follow-up of vulvar carcinoma in situ/dysplasia.   INTERVAL HISTORY:  The patient underwent wide local excision of a perineal  VIN on October 3.  Final pathology revealed mixed high grade and low grade  vulvar dysplasia with negative margins.  Postoperatively the patient  developed her period and subsequently suffered a wound separation.  This was  initially treated with sitz baths and Silvadene cream.  She had discontinued  Silvadene because of a skin rash.  The patient's separation has continued to  heal and she has only a small patch of skin which needs to epithelialize.  She has not yet resumed intercourse, but otherwise is at full activity.   PHYSICAL EXAMINATION:  VITAL SIGNS:  Weight 233 pounds, blood pressure  134/84.  ABDOMEN:  Soft and benign with well healed transverse incision.  BACK:  There is no back or CVA tenderness.  EXTREMITIES:  Groins and extremities are negative.  PELVIC:  Vulva reveals a 2 cm patch of granulation tissue in the central  high perineum which has not yet epithelialized.  The remainder of the  incision is well healed.  Unfortunately, the patient is menstruating with  tampon in the vagina.  Remainder of the pelvic examination is deferred per  patient request.   ASSESSMENT:  High grade VIN post resection complicated by wound separation  which is healing satisfactorily by secondary intent.   PLAN:  I recommended that the patient be followed by her primary physician  with inspection of the vulva and cervical cytology at six month intervals.  She is also dealing with symptomatic fibroids  being evaluated in the GYN  Clinic.  The majority of this follow-up would most appropriately be  performed in a benign setting but we would be glad to see her back any time  on a p.r.n. basis.     John   JTS/MEDQ  D:  08/06/2004  T:  08/07/2004  Job:  [id number]   cc:   Shelbie Proctor. Shawnie Pons, M.D.  Fax: [phone]   Telford Nab, R.N.  [address]  [place], [place] [address]   Douglass Rivers, M.D.  [facility].  Family Prac. Resident  [place], [place] [address]  Fax: [phone]

## 2011-02-21 NOTE — Discharge Summary (Signed)
NAME:  Carla Berry, Carla Berry                         ACCOUNT NO.:  [id number]   MEDICAL RECORD NO.:  [id number]                   PATIENT TYPE:  INP   LOCATION:  9155                                 [facility]ILITY[facility]  [facility]   PHYSICIAN:  Conni Elliot, M.D.             DATE OF BIRTH:  1971/05/29   DATE OF ADMISSION:  02/16/2004  DATE OF DISCHARGE:                                 DISCHARGE SUMMARY   DISCHARGE DIAGNOSES:  1. Nausea, vomiting, diarrhea.  2. Dehydration.  3. Intrauterine pregnancy at 60 and five-sevenths.   FOLLOW-UP:  [facility], next week.   DISCHARGE MEDICATIONS:  Continue home medications:  1. Prenatal vitamins daily.  2. Colace p.r.n.  3. Labetalol 200 mg p.o. b.i.d.  4. Glyburide 5 mg p.o. q.h.s.  5. Prevacid 30 mg p.o. daily.  6. Phenergan suppository PR q.4h. p.r.n. #4.   PROCEDURES:  None.   PRESENTING HISTORY:  This is a 40 year old G3 P1-0-1-1 who presented at 33  and [age]-sevenths with a 1-day history of vomiting more than 20 times and  diarrhea x2 who was unable to keep anything down by mouth.   HOSPITAL COURSE:  The patient was admitted for 23-hour observation, given 2  L of lactated Ringers, and then put on maintenance IV fluids.  She is  sipping water this morning.  There has been no further vomiting overnight.  Plan to increase her diet as tolerated - first to clears, then to solids. If  she tolerates those she will be discharged home in stable condition.   Ultrasound to assess AFI shows normal AFI of 15.9.   The patient should follow up with Women[facility] next week.     Brendia Sacks, M.D.                     Conni Elliot, M.D.    DG/MEDQ  D:  02/17/2004  T:  02/17/2004  Job:  [id number]

## 2011-02-21 NOTE — Discharge Summary (Signed)
NAME:  Carla Berry, Carla Berry                         ACCOUNT NO.:  [id number]   MEDICAL RECORD NO.:  [id number]                   PATIENT TYPE:  INP   LOCATION:  9149                                 FACILITY:  [facility]   PHYSICIAN:  Conni Elliot, M.D.             DATE OF BIRTH:  November 20, 1970   DATE OF ADMISSION:  03/04/2004  DATE OF DISCHARGE:                                 DISCHARGE SUMMARY   DISCHARGE DIAGNOSES:  1. Pregnancy-induced hypertension.  2. Gestational diabetes mellitus.  3. Intrauterine gestation at 36 weeks 1 day.  4. Hypokalemia.   PRESENTING HISTORY:  This is a 40 year old G3 P1-0-1-1 who presents at 36  weeks with complaint of elevated blood pressure which she took at home  today.  She does complain of a headache that has persisted all day but she  has not taken any Tylenol for this.  She denies any shortness of breath or  right upper quadrant pain but has had intermittent scotoma.   PAST OBSTETRICAL HISTORY:  In 55,  female at 67 weeks, C-section secondary  to eclampsia.   MEDICAL HISTORY:  Includes nephrolithiasis, questionable chronic  hypertension.   PHYSICAL EXAMINATION ON ADMISSION:  VITAL SIGNS:  Temperature 97.4, pulse  76, respirations 20, blood pressure 141/98.  GENERAL:  Well-nourished, well-appearing, no acute distress.  HEENT:  Grossly normal.  HEART:  Regular rate and rhythm.  LUNGS:  Clear to auscultation bilaterally.  ABDOMEN:  Soft, nontender, nondistended.  No right upper quadrant pain.  EXTREMITIES:  No edema.  NEUROLOGIC:  Reflexes 2+, no clonus bilaterally.  PELVIC:  Speculum exam:  Small amount of thick white mucus.  Digital  cervical exam:  Closed, thick, long.   HOSPITAL COURSE:  #1 - PREGNANCY-INDUCED HYPERTENSION.  The patient is  admitted to the floor, monitored per routine.  Blood pressure has normalized  and recently on discharge 119/67.  It seems to have improved after the  patient received her p.m. dose of labetalol.  I wonder  whether she is taking  the medication as directed at home as her blood pressure has been somewhat  labile, looking at her past records.  PIH panel on repeat this a.m. is  within normal limits and the patient has no right upper quadrant pain or  clonus on exam today.   #2 - HYPOKALEMIA.  Will replete p.o. and recheck a BMET.   #3 - GESTATIONAL DIABETES.  Fasting CBG 81 this a.m.  The patient has not  been taking her glyburide.  She is concerned about the risks to the fetus  and I have reassured the patient.  However, I have asked her to adjust this  with her physician at [facility] clinic on next visit.   Will discuss the above with Dr. Gavin Potters.  Anticipate discharge today.   DISCHARGE INSTRUCTIONS:  The patient should follow up at [facility] risk clinic  this Thursday, March 07, 2004.  She  is to come to [facility]U for persistent headache  or edema, right upper quadrant pain, or shortness of breath, as well as loss  of fluid, vaginal bleeding, or contractions less than 5 minutes apart.  She  is instructed by nursing to do fetal kick counts.   MEDICATIONS:  1. The patient should continue labetalol 200 mg p.o. b.i.d.  2. Colace 100 mg p.o. p.r.n. b.i.d.  3. Prenatal vitamin once daily.  4. Prevacid 30 mg p.o. daily.  5. Glyburide 5 mg p.o. q.h.s.   DIET:  Diabetic diet.   ACTIVITY:  No restrictions.   SEXUAL ACTIVITY:  Not to have sex.   FOLLOW-UP CARE:  As described above.     Brendia Sacks, M.D.                     Conni Elliot, M.D.    DG/MEDQ  D:  03/05/2004  T:  03/05/2004  Job:  [id number]

## 2011-02-21 NOTE — Op Note (Signed)
NAME:  Carla Berry, Carla Berry NO.:  [id number]   MEDICAL RECORD NO.:  [id number]          PATIENT TYPE:  AMB   LOCATION:  DAY                          FACILITY:  [facility]   PHYSICIAN:  John T. Kyla Balzarine, M.D.    DATE OF BIRTH:  [birth year]   DATE OF PROCEDURE:  07/08/2004  DATE OF DISCHARGE:                                 OPERATIVE REPORT   SURGEON:  John T. Kyla Balzarine, M.D.   ASSISTANT:  Telford Nab, R.N.   PREOPERATIVE DIAGNOSIS:  Carcinoma in situ of the vulva.   POSTOPERATIVE DIAGNOSIS:  Carcinoma in situ of the vulva.   PROCEDURE:  Wide local excision of posterior fourchette/perineum.   ANESTHESIA:  General endotracheal with local infiltration using 0.25%  Marcaine.   DESCRIPTION OF FINDINGS/INDICATIONS FOR SURGERY:  Patient had a patch of  acetowhite epithelium in the midline of the anterior perineum, immediately  adjacent to the posterior fourchette.  This was excised in toto.   DESCRIPTION OF PROCEDURE:  The patient was prepped and draped in the  lithotomy position.  Staining with acetic acid revealed the above findings.  There were some hyperpigmented areas in the anterior vulva and vagina, which  did not become acetowhite.  The lesion was infiltrated with 0.25% Marcaine  plus epinephrine.  An ellipsoid of tissue approximately 3 x 3 cm was  outlined with the skin incision using the scalpel and excised using  electrocautery for hemostasis.  The additional electrocautery was used at  the base of the surgical site for hemostasis.  The subcutaneous tissues were  reapproximated with interrupted figure-of-eight sutures of 2-0 Vicryl, and  the skin was reapproximated with running subcuticular suture line of 3-0  Vicryl.  The patient tolerated the procedure well and was returned to the  recovery room in stable condition.   ESTIMATED BLOOD LOSS:  Nil.   TRANSFUSIONS:  None.   DRAINS:  None.   SPONGE, NEEDLE, INSTRUMENT COUNTS:  Correct.   PATHOLOGY SPECIMEN:   Vulvar skin, oriented for pathology.     John   JTS/MEDQ  D:  07/08/2004  T:  07/08/2004  Job:  [id number]   cc:   Shelbie Proctor. Shawnie Pons, M.D.  Fax: [phone]   Telford Nab, R.N.  [address]  [place], [place] [address]

## 2011-02-21 NOTE — Consult Note (Signed)
NAME:  Carla Berry, Carla Berry                         ACCOUNT NO.:  [id number]   MEDICAL RECORD NO.:  [id number]                   PATIENT TYPE:  OUT   LOCATION:  GYN                                  FACILITY:  [facility]   PHYSICIAN:  John T. Kyla Balzarine, M.D.                 DATE OF BIRTH:  01/01/1971   DATE OF CONSULTATION:  06/11/2004  DATE OF DISCHARGE:                                   CONSULTATION   CHIEF COMPLAINT:  Follow up of vulvar carcinoma in situ.   INTERVAL HISTORY:  The patient has completed an additional 2 months of  Aldara. She denies significant pruritus or difficulty taking the Aldara.  She is asymptomatic in regards to her vulvar lesion.   HISTORY OF PRESENT ILLNESS:  The patient developed vulvar burning and  pruritus during pregnancy in 2004.  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  Of note, she was told that she had a  vulvar wart at that site 2 years previously.   PAST MEDICAL HISTORY:  1.  Significant for renal lithiasis, fibromyalgia, but no major      comorbidities.  2.  She had prior cesarean sections and spontaneous AB.   MEDICATIONS:  Prevacid, Colace.   ALLERGIES:  Environmental allergies but no medical allergies.   SOCIAL HISTORY:  Denies current tobacco but smoked until the past year.  Ethanol:  Denies.   FAMILY HISTORY:  Mother with adult onset diabetes and maternal cousin with  breast cancer; no ovarian, colon, or uterine malignancies.   REVIEW OF SYSTEMS:  Otherwise negative.   PHYSICAL EXAMINATION:  VITAL SIGNS:  Weight 233 pounds, blood pressure  118/82; vital signs stable and temperature afebrile.  GENERAL:  The patient is alert and oriented x3, in no acute distress.  ABDOMEN:  Obese, soft, benign without ascites, mass or organomegaly. Prior  surgical incision well-healed without hernia.  EXTREMITIES:  No lymphedema or lymphadenopathy.  PELVIC:   External genitalia and BUS have a hyperkeratotic, warty lesion  approximately 3 cm x 2cm involving the posterior fourchette.  Biopsy is not  performed; this is essentially unchanged from prior examinations.  The  vagina is clear with good support.  Cervix has no lesions and is mobile.  Bimanual and rectovaginal examinations reveal normal uterus and nonpalpable  ovaries.  No mass or nodularity.   ASSESSMENT:  Vulvar carcinoma in situ.   PLAN:  Cervical cytology should be updated.   I had a long discussion with the patient; she has essentially stable disease  but has had persistent disease despite prolonged and repeated Aldara use.  I  recommended either surgical extirpation or laser vaporization.  I had a long  discussion regarding the risks and benefits of each procedure and would  probably favor a simple excision given the patient's situation at home with  a  new infant and husband who works long shifts.  The patient is extremely  concerned about funding as she does not have insurance; we will go through  the financial office to attempt to arrange funding.                                               John T. Kyla Balzarine, M.D.    JTS/MEDQ  D:  06/11/2004  T:  06/11/2004  Job:  [id number]   cc:   Shelbie Proctor. Shawnie Pons, M.D.  Fax: [phone]   Telford Nab, R.N.  [address]  [place], [place] [address]

## 2011-02-21 NOTE — Discharge Summary (Signed)
NAME:  Carla Berry, Carla Berry                         ACCOUNT NO.:  [id number]   MEDICAL RECORD NO.:  [id number]                   PATIENT TYPE:  INP   LOCATION:  9120                                 FACILITY:  [facility]   PHYSICIAN:  Phil D. Okey Dupre, M.D.                  DATE OF BIRTH:  Nov 14, 1970   DATE OF ADMISSION:  03/04/2004  DATE OF DISCHARGE:  03/10/2004                                 DISCHARGE SUMMARY   DISCHARGE DIAGNOSES:  1. Postpartum day #3 status post low transverse cesarean section.  2. Intrauterine pregnancy delivered at 38 and three-sevenths weeks     gestation.  3. Preeclampsia.  4. Gestational diabetes, diet controlled.  5. History of eclampsia with seizure with first pregnancy.  6. Gastroesophageal reflux disease.   DISCHARGE MEDICATIONS:  1. Ibuprofen 600 mg p.o. q.6h. p.r.n.  2. Prenatal vitamins one daily x6 weeks or until finished breastfeeding.  3. Pepcid 20 mg b.i.d.   HOSPITAL COURSE:  Carla Berry is a 40 year old white female that came in on  Mar 04, 2004 to [facility]U for elevated blood pressures.  She was followed at high  risk clinic due to her history of eclampsia with her first pregnancy.  She  was symptomatic, complaining of headache.  She also had intermittent  scotomata.  She was on labetalol 200 mg b.i.d. by high risk clinic.  She had  a questionable history of chronic hypertension but was not on medications  prior to pregnancy.  Her blood pressures on admission went up to 150/97.  Her original PIH panel done on admission was negative.  On March 07, 2004 an  amniocentesis was done to confirm fetal lung maturity.  She received a  repeat LTCS on March 07, 2004 due to her preeclampsia with fetal lung maturity  and history of eclampsia with her first pregnancy.  Also at the time she had  a bilateral tubal ligation and lysis of adhesions.  She delivered a viable  female infant with Apgars of 9 at one and 9 at five.  She received magnesium  postpartum and started to  diurese well postpartum.  Her blood pressures  remained elevated postpartum and she was started on hydrochlorothiazide 25  mg daily.  She was discontinued off magnesium at 11 p.m. on March 08, 2004.  As far as her gestational diabetes, she is not on any oral agents.  Her CBG  checks at discharge have been in the 80s at bedtime and fasting mornings.   #1 - FOR HER POSTOPERATIVE PAIN.  She has not yet had a bowel movement but  is passing gas.  She does not want narcotic analgesia for her pain.  She  will need staple removal, returning to MAU on Tuesday, March 12, 2004.   #2 - FOR HER PREECLAMPSIA.  Her blood pressures remain somewhat elevated at  the highest of 155/98 on her discharge day.  She is  to remain on  hydrochlorothiazide 25 mg daily.  We educated her on warning signs of  preeclampsia.  If she starts getting headache, scotomata, epigastric pain,  increased edema, or decreased urine output she is to return to MAU.  She  will follow up in 2 weeks with Dr. Michae Kava at [facility] for  a blood pressure and glucose check.   #3 - GESTATIONAL DIABETES.  She was diet controlled during pregnancy.  Currently not on any oral agents.  Good control of her blood sugars at  present.  Will need follow-up as an outpatient.   #4 - PLANS FOR FEEDING.  She is currently breastfeeding and pumping.   #5 - FOR CONTRACEPTION.  She is status post BTL.   #6 - GASTROESOPHAGEAL REFLUX DISEASE.  I took her off of her PPI as it is a  pregnancy category C and placed her on Pepcid 20 mg b.i.d.     Seymour Bars, D.O.                         Phil D. Okey Dupre, M.D.    Corky Downs  D:  03/10/2004  T:  03/11/2004  Job:  [id number]   cc:   Douglass Rivers, M.D.  [facility].  Family Prac. Resident  [place], [place] [address]  Fax: [phone]

## 2011-02-21 NOTE — Consult Note (Signed)
NAME:  Carla Berry, Carla Berry                         ACCOUNT NO.:  [id number]   MEDICAL RECORD NO.:  [id number]                   PATIENT TYPE:  OUT   LOCATION:  GYN                                  FACILITY:  [facility]   PHYSICIAN:  John T. Kyla Balzarine, M.D.                 DATE OF BIRTH:  [birth year]   DATE OF CONSULTATION:  12/20/2003  DATE OF DISCHARGE:                                   CONSULTATION   CHIEF COMPLAINT:  Follow up of vulvar carcinoma in situ during pregnancy.   INTERVAL HISTORY:  The patient has been using Aldara as directed but had a  treatment break because of vulvar irritation.  Since using a pad to protect  the perineal skin from urine, she has had less irritation.  She notes less  burning but otherwise has noted no gross change in the lesion.  She has been  started on an antihypertensive for pregnancy-induced hypertension.  The plan  is for cesarean section at onset of labor or at approximately 39 weeks.   Past History, Personal Social History, Family History and Review of Systems  are otherwise unchanged from those documented on October 25, 2003.   CURRENT MEDICATIONS:  Include Prevacid, prenatal vitamins, Colace and  Lotrel.   ALLERGIES:  No medical allergies.   PHYSICAL EXAMINATION:  VITAL SIGNS:  Weight 246 pounds and vital signs  stable.  GENERAL:  The patient is alert and oriented x3 in no acute distress.  ABDOMEN:  Soft and benign without ascites.  Gravid uterus 3 fingerbreadths  above the umbilicus with fetal motion.  No tenderness.  Marland Kitchen  EXTREMITIES:  Full range of motion without edema.  LYMPHATICS:  No groin adenopathy.  PELVIC:  External genital and BUS are normal with the exception of the 2 cm  condylomatous lesion involving the posterior fourchette which does not  extend into the vaginal canal.  The vagina is otherwise clear.  On  examination, this is unchanged to slightly diminished in size compared with  prior examination.   ASSESSMENT:  Vulvar carcinoma in  situ/bowenoid papulosis, receiving Aldara.   PLAN:  Complete another month of Aldara and we will reexamine the patient  within a month after delivery.                                               John T. Kyla Balzarine, M.D.    JTS/MEDQ  D:  12/20/2003  T:  12/21/2003  Job:  [id number]   cc:   Shelbie Proctor. Shawnie Pons, M.D.  Fax: [phone]   Telford Nab, R.N.  [address]  [place], [place] [address]

## 2011-02-21 NOTE — Consult Note (Signed)
NAME:  Carla Berry, Carla Berry                         ACCOUNT NO.:  [id number]   MEDICAL RECORD NO.:  [id number]                   PATIENT TYPE:  OUT   LOCATION:  GYN                                  [facility]ACILITY:  [facility]   PHYSICIAN:  De Blanch, M.D.         DATE OF BIRTH:  [birth year]   DATE OF CONSULTATION:  04/16/2004  DATE OF DISCHARGE:                                   CONSULTATION   REASON FOR CONSULTATION:  A 40 year old white female returns for continuing  follow up of vulvar carcinoma in situ.  This initially discovered in  pregnancy and the patient was treated with Aldara with a mild response.  She  has now delivered a child on March 07, 2004 without difficulty and presents  today.  She apparently is asymptomatic from the point of view of her vulvar  lesion.   Past Medical History, Personal History, Social History, and Review of  Systems are unchanged as documented on October 25, 2003.   DRUG ALLERGIES:  None.   PHYSICAL EXAMINATION:  VITAL SIGNS:  Weight 222 pounds, blood pressure  120/80.  GENERAL:  The patient is an obese white female in no acute distress.  PELVIC:  EGBUS shows approximately 2 cm thickened, exophytic, hypopigmented  lesion on the perineal body.  This apparently is smaller than described by  Dr. Kyla Balzarine previously.  The remainder of the vagina and vulva appear normal.  Cervix is postpartum and normal.  Bimanual exam reveals no nodularity, or  masses, or induration.   IMPRESSION:  Carcinoma in situ of the perineum.  The patient apparently has  had some response to Aldara and was tolerating the Aldara well although she  has not used it for a number of weeks given the fact she was late in  pregnancy.   Had a lengthy discussion with the patient regarding management options which  could include excision, laser vaporization, or continued use of Aldara.  At  this junction, the patient does not wish to undertake any surgical  intervention given that she has a  newborn at home and therefore we will  continue to use Aldara 3 times a week. We will have her return to see [place] in  approximately 2 months for a reevaluation.                                               De Blanch, M.D.    DC/MEDQ  D:  04/17/2004  T:  04/17/2004  Job:  [id number]   cc:   Shelbie Proctor. Shawnie Pons, M.D.  Fax: [phone]   Telford Nab, R.N.  [address]  [place], [place] [address]

## 2011-02-21 NOTE — Op Note (Signed)
NAME:  Carla Berry, Carla Berry                         ACCOUNT NO.:  [id number]   MEDICAL RECORD NO.:  [id number]                   PATIENT TYPE:  INP   LOCATION:  9372                                 FACILITY:  [facility]   PHYSICIAN:  Lesly Dukes, M.D.              DATE OF BIRTH:  1971/06/09   DATE OF PROCEDURE:  03/07/2004  DATE OF DISCHARGE:                                 OPERATIVE REPORT   PREOPERATIVE DIAGNOSIS:  20. 40 year old para 1-0-0-1, at 23 3/4 weeks estimated gestational age with     pre-eclampsia and positive fetal lung maturity.  2. Multiparity desiring permanent sterilization.   POSTOPERATIVE DIAGNOSIS:  45. 40 year old para 1-0-0-1, at 77 3/4 weeks estimated gestational age with     pre-eclampsia and positive fetal lung maturity.  2. Multiparity desiring permanent sterilization.   PROCEDURE:  Repeat low flap transverse cesarean section, bilateral tubal  ligation, lysis of adhesions.   SURGEON:  Lesly Dukes, M.D.   ASSISTANT:  Miles Costain, M.D.   ANESTHESIA:  Spinal.   ESTIMATED BLOOD LOSS:  1000 mL.   COMPLICATIONS:  None.   PATHOLOGY:  Placenta.   FINDINGS:  Viable female infant, Apgars 9 at 1 minute and 9 at 5 minutes,  clear fluid, arterial cord pH 7.3, normal placenta with three vessel cord,  normal uterus, ovaries, and fallopian tubes.   DESCRIPTION OF PROCEDURE:  After informed consent was obtained, the patient  was taken to the operating room where spinal anesthesia was found to be  adequate.  The patient was placed in a dorsal supine position with a  leftward tilt and prepped and draped in the normal sterile fashion.  A Foley  catheter was placed in the bladder.  A Pfannenstiel skin incision was made  with the scalpel and carried down to the underlying layer of fascia.  The  fascia was incised in the midline and the incision was extended bilaterally  with the Bovie.  The superior and inferior aspects of the fascial incision  were grasped with the  Kocher clamps, tented up, and dissected off sharply  and bluntly from the underlying layers of the rectus muscles.  The rectus  muscles were separated in the midline.  The peritoneum was identified,  tented up, and entered sharply with Metzenbaum scissors.  This incision was  extended both superiorly and inferiorly with good visualization of the  bladder.  The bladder blade was inserted and the vesicouterine peritoneum  identified, tented up, and entered sharply with Metzenbaum scissors.  The  incision was extended bilaterally and the bladder flap created digitally.  The bladder blade was reinserted.  The uterine incision was made in a  transverse fashion in the lower uterine segment and the incision  was  extended bilaterally with the bandage scissors.  The baby's head delivered  easily and the nose and mouth were suctioned.  The rest of the body of the  baby  was delivered without complications.  The cord was was clamped and cut  and the baby was handed off to the awaiting pediatricians.  Cord blood was  sent for type and screen.  A cord gas was also sent.  The placenta delivered  manually and had a three vessel cord.  The uterus was exteriorized and  cleared of all clots and debris.  0 Vicryl was used to close the uterine  incision in a running locked fashion.  A second suture of 0 chromic was used  to aid with hemostasis.  The uterus was noted to be hemostatic off tension.  At this time, the tubal ligation was performed using Filshie clips.  Each  fallopian tube was identified and followed out to their fimbriated end.  A  Filshie clip was applied, one on each fallopian tube in the middle portion.  Good hemostasis was noted.  The uterus was returned to the abdomen and the  Filshie clip was noted to be in place with the uterus inside the abdomen.  The uterine incision was also noted to be hemostatic off tension.  The  gutters were cleared of all clots and debris.  The fascia was closed with  0  Vicryl in a running fashion.  The subcutaneous tissue was copiously  irrigated.  The skin was closed with staples.  A pressure dressing was  applied to the abdomen.  The patient went to the recovery room in stable  condition.  All counts were correct.                                               Lesly Dukes, M.D.    Lora Paula  D:  03/07/2004  T:  03/07/2004  Job:  [id number]

## 2011-02-21 NOTE — Consult Note (Signed)
NAME:  Carla Berry, Carla Berry                         ACCOUNT NO.:  [id number]   MEDICAL RECORD NO.:  [id number]                   PATIENT TYPE:  OUT   LOCATION:  GYN                                  FACILITY:  [facility]   PHYSICIAN:  John T. Kyla Balzarine, M.D.                 DATE OF BIRTH:  Feb 12, 1971   DATE OF CONSULTATION:  10/25/2003  DATE OF DISCHARGE:                                   CONSULTATION   This 40 year old, para 1-0-1-1 woman is seen at the request of Dr. Shawnie Pons for  consultation regarding management of vulvar carcinoma in situ complicating  pregnancy.   HISTORY OF PRESENT ILLNESS:  The patient's last menstrual period was  June 19, 2003 (approximately 19 weeks IUP at present).  She gives a  history of developing spotting in October and initially was told that she  had a questionable wart.  She subsequently developed vulvar burning and was  seen by Dr. 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  The lesion was biopsied  only, and the patient has had increased irritation since the biopsy.  Of  note, she was told that she had a vulvar wart 2 years ago at the time of the  miscarriage which spontaneously resolved.   PAST MEDICAL HISTORY:  1. Renolithiasis complicating her first pregnancy.  2. Soft diagnosis of fibromyalgia on no medication.  3. She had eclampsia with her first pregnancy and was delivered by cesarean     section and has since had borderline hypertension off medications.  She     had a spontaneous AB at [redacted] weeks gestation approximately 2 years ago.  4. She does recall some genital abnormality approximately 13 years ago,     possibly a mildly abnormal Pap smear, treated with PCA, and Pap smears     have been normal through November 2004.   MEDICATIONS:  Prevacid, prenatal vitamins, and Colace.   ALLERGIES:  She has environmental allergies but no medical allergies.   PERSONAL/SOCIAL HISTORY:  Denies  tobacco currently but smoked until 6 months  ago.  She denies ethanol.   FAMILY MEDICAL HISTORY:  Significant for mother with adult onset diabetes  mellitus and maternal cousin with breast cancer.  No ovarian colon or  uterine malignancies.   REVIEW OF SYSTEMS:  GENERAL/SYSTEMIC:  Denies weight loss, fever, or chills;  reports feeling well during pregnancy.  GI:  No diarrhea, constipation,  hematochezia, hemorrhoids active, or change in bowel function.  GU:  Perineal burning with urination but otherwise no urinary symptoms other than  increased urine loss with this pregnancy.  OB/GYN:  As per HPI.  NEURO:  Eclamptic seizures with her first pregnancy 13 years ago.   PHYSICAL EXAMINATION:  VITAL SIGNS:  Weight 238 pounds, blood pressure  128/92, pulse 88, respirations 18, afebrile.  GENERAL:  The patient is alert and oriented  x 3 in no acute distress.  ABDOMEN:  Soft and benign without ascites.  Gravid uterus rises to 1-2  fingerbreadths below the umbilicus.  Fetal motion detected.  There is no  tenderness.  There is no groin adenopathy.  EXTREMITIES:  Full strength and range of motion without edema.  PELVIC:  External genitalia and BUS are normal with the exception of a 3 cm  condylomatous lesion involving the posterior fourchette.  This does not  extend into the vaginal canal.  Vagina is clear with small cystocele and  rectocele.  Cervix is mobile without lesions.  Bimanual and rectovaginal  examinations reveal an enlarged uterus compatible with dates.  Adnexa and  parametria are clear on bimanual and rectovaginal examinations.   PROCEDURE NOTE:  After verbal informed consent was obtained, I performed  colposcopy of the external genitalia using acetic acid.  A 3 cm flap and  condylomatous acetowhite lesion was present in the midline of the posterior  fourchette with a healing biopsy site.  There were no features to suggest  invasion.   ASSESSMENT:  Vulvar carcinoma in situ.    RECOMMENDATIONS:  Because young women may have a bowenoid papulosis  (transient and likely viral mediated appearance of lesions that on biopsy  are compatible with carcinoma in situ), particularly in relation to  pregnancy, I recommended conservative therapy, at least to begin with.  Because she has had a history of wart 2 years prior, I would recommend  treating her with Aldara TIW in an attempt to boost her local immunity and  to use a rinse/blot/blow-dry regimen for vulvar hygiene.  The patient was  given a prescription and cautions regarding side effects of Aldara.  We will  see her back for follow-up in approximately 2 months and if there has been  no improvement in the appearance of this lesion, might perform wide local  excision prior to delivery.  If the lesion is improving on Aldara, I would  reevaluate her approximately 2 months after delivery.                                               John T. Kyla Balzarine, M.D.    JTS/MEDQ  D:  10/25/2003  T:  10/25/2003  Job:  [id number]   cc:   Shelbie Proctor. Shawnie Pons, M.D.  Fax: [phone]   Telford Nab, R.N.  [address]  [place], [place] [address]

## 2011-04-21 ENCOUNTER — Telehealth: Payer: Self-pay | Admitting: Family Medicine

## 2011-04-21 DIAGNOSIS — B351 Tinea unguium: Secondary | ICD-10-CM

## 2011-04-21 NOTE — Telephone Encounter (Signed)
Is still having problem with the Big Toe on the right foot, was told that Dr. McDiarmid wanted to know so that he could determine what the correct dosage for the problem.

## 2011-04-22 MED ORDER — TERBINAFINE HCL 250 MG PO TABS: ORAL_TABLET | ORAL | Status: DC

## 2011-04-22 NOTE — Telephone Encounter (Signed)
Patient informed of message from MD. 

## 2011-04-22 NOTE — Telephone Encounter (Signed)
Please let Ms Biancardi know that prescription for Terbinafine (antifungal pills) to treat her toenail infection has been sent to the [facility] in [place].  It will cost about $12 per month for the Terbinafine and she will need to take the Terbinafine daily for 3 months.   Her fungal infected toe nails will not look better for at least 9 months after finishing the 3 months of Terbinafine medication.

## 2011-05-12 ENCOUNTER — Emergency Department: Payer: Self-pay | Admitting: *Deleted

## 2011-05-13 ENCOUNTER — Telehealth: Payer: Self-pay | Admitting: Family Medicine

## 2011-05-13 NOTE — Telephone Encounter (Signed)
Patient seen at [facility] ER last night for increasing cough and fever.  CXR revealed beginning of PNA.  She was placed on steroid and antibiotic.  Calling tonight because she has been having headache off and on since yesterday.  Took blood pressure yesterday, was 128 systolic.  Repeated today was 156.  She was concerned with headache and blood pressure that night.  Denies any changes in vision, abdominal pain, lightheadedness.  Reassured patient that blood pressure likely elevated secondary to pain rather than vice versa.  Gave warning signs and things to watch for and that would spark return to ED.  She agreed with plan.  She will call PCP for fu for PNA tomorrow.

## 2011-12-12 ENCOUNTER — Other Ambulatory Visit: Payer: Self-pay | Admitting: Family Medicine

## 2011-12-12 MED ORDER — ESOMEPRAZOLE MAGNESIUM 40 MG PO CPDR: 40.0000 mg | DELAYED_RELEASE_CAPSULE | Freq: Every day | ORAL | Status: DC

## 2012-04-20 ENCOUNTER — Telehealth: Payer: Self-pay | Admitting: Family Medicine

## 2012-04-20 NOTE — Telephone Encounter (Signed)
Pt was constipated yesterday and today when she went to bathroom she saw a lot of blood and is concerned - wants to talk to nurse

## 2012-04-20 NOTE — Telephone Encounter (Signed)
Returned call to patient.  Woke up yesterday with large hemorrhoid and used Preparation-H.  Hemorrhoid was painful yesterday.  Not painful this morning, but did notice bright red blood when she wiped.  Patient has had issues with hemorrhoids in past and informed to continue Preparation-H,  increase fluid intake, fiber, and can try sitz baths.  Can also try OTC stool softeners.  Patient will try home care and call back if not better.  Gaylene Brooks, RN

## 2012-05-20 ENCOUNTER — Other Ambulatory Visit (HOSPITAL_COMMUNITY)
Admission: RE | Admit: 2012-05-20 | Discharge: 2012-05-20 | Disposition: A | Discharge status: Home / Self Care | Payer: BC Managed Care – PPO | Source: Ambulatory Visit | Attending: Family Medicine | Admitting: Family Medicine

## 2012-05-20 ENCOUNTER — Ambulatory Visit (INDEPENDENT_AMBULATORY_CARE_PROVIDER_SITE_OTHER): Payer: BC Managed Care – PPO | Admitting: Family Medicine

## 2012-05-20 VITALS — BP 138/76 | Temp 97.9°F | Ht 63.6 in | Wt 251.0 lb

## 2012-05-20 DIAGNOSIS — N901 Moderate vulvar dysplasia: Secondary | ICD-10-CM

## 2012-05-20 DIAGNOSIS — Z124 Encounter for screening for malignant neoplasm of cervix: Secondary | ICD-10-CM

## 2012-05-20 DIAGNOSIS — Z01419 Encounter for gynecological examination (general) (routine) without abnormal findings: Secondary | ICD-10-CM | POA: Insufficient documentation

## 2012-05-20 DIAGNOSIS — Z113 Encounter for screening for infections with a predominantly sexual mode of transmission: Secondary | ICD-10-CM | POA: Insufficient documentation

## 2012-05-25 ENCOUNTER — Encounter: Payer: Self-pay | Admitting: Family Medicine

## 2012-05-25 NOTE — Progress Notes (Signed)
Patient ID: Carla Berry, female   DOB: 07-27-1971, 41 y.o.   MRN: [id number] Here for followup for colposcopy. She had over intraepithelial neoplasia status post resection and he is here for her yearly followup. She's also due for Pap smear.  She's had no symptoms.  External colposcopy of the vulva.: Patient given informed consent, signed copy in the chart. The entire foot was examined grossly and with the colposcope. There were no acetowhite lesions. There were no and amount is noted on green filter. There is no areas of suspicion to biopsy. EXAM. GU externally normal. No adnexal masses or tenderness. Normal uterus size. Pap smear was obtained. ASSESSMENT. 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 External colposcopy done today which reveals no suspicious areas. We also performed a Pap smear. I'll see her back in one year. At her request we also performed a breast exam which showed some mild fibrocystic changes but no worrisome masses. We discussed starting her mammograms at age 73.

## 2012-06-30 ENCOUNTER — Other Ambulatory Visit: Payer: Self-pay | Admitting: Gastroenterology

## 2012-06-30 DIAGNOSIS — R079 Chest pain, unspecified: Secondary | ICD-10-CM

## 2012-07-01 ENCOUNTER — Ambulatory Visit (INDEPENDENT_AMBULATORY_CARE_PROVIDER_SITE_OTHER): Payer: BC Managed Care – PPO | Admitting: *Deleted

## 2012-07-01 DIAGNOSIS — Z23 Encounter for immunization: Secondary | ICD-10-CM

## 2012-07-13 ENCOUNTER — Ambulatory Visit (HOSPITAL_COMMUNITY)
Admission: RE | Admit: 2012-07-13 | Discharge: 2012-07-13 | Disposition: A | Discharge status: Home / Self Care | Payer: BC Managed Care – PPO | Source: Ambulatory Visit | Attending: Gastroenterology | Admitting: Gastroenterology

## 2012-07-13 ENCOUNTER — Encounter (HOSPITAL_COMMUNITY)
Admission: RE | Admit: 2012-07-13 | Discharge: 2012-07-13 | Disposition: A | Discharge status: Home / Self Care | Payer: BC Managed Care – PPO | Source: Ambulatory Visit | Attending: Gastroenterology | Admitting: Gastroenterology

## 2012-07-13 DIAGNOSIS — Z87442 Personal history of urinary calculi: Secondary | ICD-10-CM | POA: Insufficient documentation

## 2012-07-13 DIAGNOSIS — R079 Chest pain, unspecified: Secondary | ICD-10-CM | POA: Insufficient documentation

## 2012-07-13 MED ORDER — TECHNETIUM TC 99M MEBROFENIN IV KIT
5.0000 | PACK | Freq: Once | INTRAVENOUS | Status: AC | PRN
  Administered 2012-07-13: 5 via INTRAVENOUS

## 2012-07-13 MED ORDER — SINCALIDE 5 MCG IJ SOLR
0.0200 ug/kg | Freq: Once | INTRAMUSCULAR | Status: AC
  Administered 2012-07-13: 2.3 ug via INTRAVENOUS

## 2012-07-15 ENCOUNTER — Telehealth: Payer: Self-pay | Admitting: *Deleted

## 2012-07-15 NOTE — Telephone Encounter (Signed)
PA required for Nexium. Her insurance  plan pays for # 90 capsules every 180 days. Will need PA for quantity override. Form will be faxed.   form placed in MD box.

## 2012-07-16 ENCOUNTER — Encounter: Payer: Self-pay | Admitting: Family Medicine

## 2012-07-16 ENCOUNTER — Other Ambulatory Visit: Payer: Self-pay | Admitting: Family Medicine

## 2012-07-16 DIAGNOSIS — K2900 Acute gastritis without bleeding: Secondary | ICD-10-CM

## 2012-07-16 DIAGNOSIS — K219 Gastro-esophageal reflux disease without esophagitis: Secondary | ICD-10-CM

## 2012-07-16 HISTORY — DX: Acute gastritis without bleeding: K29.00

## 2012-07-16 MED ORDER — ESOMEPRAZOLE MAGNESIUM 40 MG PO CPDR: 40.0000 mg | DELAYED_RELEASE_CAPSULE | Freq: Every day | ORAL | Status: DC

## 2012-07-16 NOTE — Telephone Encounter (Signed)
Form faxed to insurance company. 

## 2012-07-23 DIAGNOSIS — K317 Polyp of stomach and duodenum: Secondary | ICD-10-CM

## 2012-07-23 HISTORY — DX: Polyp of stomach and duodenum: K31.7

## 2012-07-27 ENCOUNTER — Encounter: Payer: Self-pay | Admitting: Family Medicine

## 2012-07-28 ENCOUNTER — Telehealth: Payer: Self-pay | Admitting: Family Medicine

## 2012-07-28 ENCOUNTER — Other Ambulatory Visit (HOSPITAL_COMMUNITY): Payer: Self-pay | Admitting: Cardiology

## 2012-07-28 DIAGNOSIS — R079 Chest pain, unspecified: Secondary | ICD-10-CM

## 2012-07-28 NOTE — Telephone Encounter (Signed)
Patient called complaining of fever, cough, congestion and headache. She would like to take over the counter medication, but she is not sure what is safe. Last week, she had an upper endoscopy with multiple biopsies and was instructed by the GI doc not to take NSAIDS. She was calling for clarification of what NSAIDS are. I explained that she should not take anything with ibuprofen in it. She understood. She will call clinic tomorrow for evaluation if she feels worse.

## 2012-08-02 ENCOUNTER — Encounter (HOSPITAL_COMMUNITY): Payer: BC Managed Care – PPO

## 2012-08-05 ENCOUNTER — Ambulatory Visit (HOSPITAL_COMMUNITY)
Admission: RE | Admit: 2012-08-05 | Discharge: 2012-08-05 | Disposition: A | Discharge status: Home / Self Care | Payer: BC Managed Care – PPO | Source: Ambulatory Visit | Attending: Cardiology | Admitting: Cardiology

## 2012-08-05 DIAGNOSIS — R0789 Other chest pain: Secondary | ICD-10-CM | POA: Insufficient documentation

## 2012-08-09 ENCOUNTER — Ambulatory Visit (HOSPITAL_COMMUNITY): Payer: BC Managed Care – PPO

## 2012-09-27 ENCOUNTER — Encounter: Payer: Self-pay | Admitting: Family Medicine

## 2012-12-26 ENCOUNTER — Emergency Department (HOSPITAL_COMMUNITY): Payer: Self-pay

## 2012-12-26 ENCOUNTER — Encounter (HOSPITAL_COMMUNITY): Payer: Self-pay | Admitting: *Deleted

## 2012-12-26 ENCOUNTER — Telehealth: Payer: Self-pay | Admitting: Family Medicine

## 2012-12-26 ENCOUNTER — Emergency Department (HOSPITAL_COMMUNITY)
Admission: EM | Admit: 2012-12-26 | Discharge: 2012-12-26 | Disposition: A | Discharge status: Home / Self Care | Payer: Self-pay | Attending: Emergency Medicine | Admitting: Emergency Medicine

## 2012-12-26 ENCOUNTER — Emergency Department (HOSPITAL_COMMUNITY)
Admission: EM | Admit: 2012-12-26 | Discharge: 2012-12-26 | Disposition: A | Discharge status: Nursing Home (Includes ICF) | Payer: BC Managed Care – PPO | Source: Home / Self Care

## 2012-12-26 DIAGNOSIS — K5289 Other specified noninfective gastroenteritis and colitis: Secondary | ICD-10-CM | POA: Insufficient documentation

## 2012-12-26 DIAGNOSIS — R197 Diarrhea, unspecified: Secondary | ICD-10-CM | POA: Insufficient documentation

## 2012-12-26 DIAGNOSIS — R109 Unspecified abdominal pain: Secondary | ICD-10-CM

## 2012-12-26 DIAGNOSIS — Z860100 Personal history of colon polyps, unspecified: Secondary | ICD-10-CM | POA: Insufficient documentation

## 2012-12-26 DIAGNOSIS — R1013 Epigastric pain: Secondary | ICD-10-CM | POA: Insufficient documentation

## 2012-12-26 DIAGNOSIS — R5381 Other malaise: Secondary | ICD-10-CM | POA: Insufficient documentation

## 2012-12-26 DIAGNOSIS — K529 Noninfective gastroenteritis and colitis, unspecified: Secondary | ICD-10-CM

## 2012-12-26 DIAGNOSIS — Z79899 Other long term (current) drug therapy: Secondary | ICD-10-CM | POA: Insufficient documentation

## 2012-12-26 DIAGNOSIS — K219 Gastro-esophageal reflux disease without esophagitis: Secondary | ICD-10-CM | POA: Insufficient documentation

## 2012-12-26 DIAGNOSIS — R112 Nausea with vomiting, unspecified: Secondary | ICD-10-CM | POA: Insufficient documentation

## 2012-12-26 LAB — CBC
Platelets: 309 10*3/uL (ref 150–400)
RBC: 4.79 MIL/uL (ref 3.87–5.11)
RDW: 13.9 % (ref 11.5–15.5)
WBC: 9.9 10*3/uL (ref 4.0–10.5)

## 2012-12-26 LAB — POCT I-STAT, CHEM 8
Creatinine, Ser: 0.8 mg/dL (ref 0.50–1.10)
HCT: 43 % (ref 36.0–46.0)
Hemoglobin: 14.6 g/dL (ref 12.0–15.0)
Potassium: 4.2 mEq/L (ref 3.5–5.1)
Sodium: 140 mEq/L (ref 135–145)

## 2012-12-26 LAB — LIPASE, BLOOD: Lipase: 20 U/L (ref 11–59)

## 2012-12-26 LAB — POCT I-STAT TROPONIN I: Troponin i, poc: 0 ng/mL (ref 0.00–0.08)

## 2012-12-26 LAB — HEPATIC FUNCTION PANEL
AST: 35 U/L (ref 0–37)
Bilirubin, Direct: 0.1 mg/dL (ref 0.0–0.3)
Indirect Bilirubin: 0.3 mg/dL (ref 0.3–0.9)
Total Bilirubin: 0.4 mg/dL (ref 0.3–1.2)

## 2012-12-26 LAB — CALCIUM: Calcium: 8.6 mg/dL (ref 8.4–10.5)

## 2012-12-26 MED ORDER — SODIUM CHLORIDE 0.9 % IV BOLUS (SEPSIS)
1000.0000 mL | Freq: Once | INTRAVENOUS | Status: AC
  Administered 2012-12-26: 1000 mL via INTRAVENOUS

## 2012-12-26 MED ORDER — ONDANSETRON 4 MG PO TBDP
ORAL_TABLET | ORAL | Status: AC
  Filled 2012-12-26: qty 1

## 2012-12-26 MED ORDER — PANTOPRAZOLE SODIUM 40 MG IV SOLR
40.0000 mg | Freq: Once | INTRAVENOUS | Status: AC
  Administered 2012-12-26: 40 mg via INTRAVENOUS
  Filled 2012-12-26: qty 40

## 2012-12-26 MED ORDER — ONDANSETRON 4 MG PO TBDP
4.0000 mg | ORAL_TABLET | Freq: Once | ORAL | Status: AC
  Administered 2012-12-26: 4 mg via ORAL

## 2012-12-26 NOTE — ED Notes (Signed)
Pt from [facility].  Vomiting x 6 days, not vomited in 2 days.  Feel weak and tired.  Pt given 1000 cc bolus at [facility].  Vitals stable.

## 2012-12-26 NOTE — ED Notes (Signed)
Patient complains of nausea vomiting x 6 days and diarrhea x 5 days.

## 2012-12-26 NOTE — ED Provider Notes (Signed)
Carla Berry is a 42 y.o. female who presents to Urgent Care today for diarrhea and fatigue.  Patient developed nausea vomiting and diarrhea Wednesday evening. The vomiting resolved after one day however she has persistent diarrhea. She denies any blood in her stool. She notes that she has begun feeling weak and fatigued. She is unable to eat much but is keeping some fluids down.   She denies any current fevers or chills. She notes some abdominal cramping but denies frank pain.    PMH reviewed.  GERD History  Substance Use Topics  . Smoking status: Not on file  . Smokeless tobacco: Not on file  . Alcohol Use: Not on file   ROS as above Medications reviewed. Current Facility-Administered Medications  Medication Dose Route Frequency Provider Last Rate Last Dose  . sodium chloride 0.9 % bolus 1,000 mL  1,000 mL Intravenous Once Rodolph Bong, MD       Current Outpatient Prescriptions  Medication Sig Dispense Refill  . esomeprazole (NEXIUM) 40 MG capsule Take 1 capsule (40 mg total) by mouth daily. one hour before largest meal of the day  90 capsule  3  . nystatin-triamcinolone (MYCOLOG II) cream Disp 60 g tube for 3 month suppy       . terbinafine (LAMISIL) 250 MG tablet Take 1 tablet by mouth daily for 12 weeks.  30 tablet  2    Exam:  BP 123/93  Pulse 84  Temp(Src) 98.4 F (36.9 C) (Oral)  SpO2 100% Filed Vitals:   12/26/12 1434 12/26/12 1439 12/26/12 1440 12/26/12 1441  BP: 133/96 133/86 133/103 123/93  Pulse: 79 74 80 84  Temp: 98.4 F (36.9 C)     TempSrc: Oral     SpO2: 100%      Gen: Well NAD HEENT: EOMI,  MMM Lungs: CTABL Nl WOB Heart: RRR no MRG Abd: Hyperactive bowel sounds , NT, ND Exts: Non edematous BL  LE, warm and well perfused.   No results found for this or any previous visit (from the past 24 hour(s)). No results found.  Assessment and Plan: 42 y.o. female with  viral gastroenteritis versus gastritis or other serious abdominal etiology.  She received  1 L normal saline bolus in the urgent care today however felt no better and actually got worse. She noted worsening abdominal pain towards the end of the IV fluids.  Discussed options with patient. Transfer transfer to the emergency room for further evaluation and management. Transfer via [facility] as the patient feels dizzy.    Rodolph Bong, MD 12/26/12 [phone]

## 2012-12-26 NOTE — ED Notes (Signed)
Pt coming from urgent care and was sent here for further evaluation. Pt reports she got nausea medication and a bag of fluids at urgent care. Pt denies feeling better after fluids. Pt c/o mid abd pain X 6 days, pt reports she has vomited a lot the past week but nothing today or yesterday. Pt reports she has had nonstop diarrhea since yesterday. Pt able to keep fluids down, unable to keep food down. Pt in nad, skin warm and dry, resp e/u.

## 2012-12-26 NOTE — Telephone Encounter (Signed)
EMERGENCY LINE CALL:  Stomach gurgling and cramping x 5 days. Vomiting for 2 days with some diarrhea. Pain continued afterwards with cramping. Yesterday she reports another episode of diarrhea x 3-4 times and pain continues. She states she is on her period (not pregnant), no fevers. Able to eat some soft foods and drinking plenty of fluids. Every time she tries to eat she feels really full and unable to eat more. Encouraged patient to come to Urgent Care or the ED if she is concerned since I am unable to examine her over the phone. Does not appear to have any red flags at this time. She states she works in [employer] and I have suggested she not go back to work since she still has a GI illness. Pt agrees and will be seen at [facility] tomorrow for further evaluation.  Amber M. Hairford, M.D. 12/26/2012 10:18 AM

## 2012-12-26 NOTE — ED Provider Notes (Signed)
History     CSN: [id number]  Arrival date & time 12/26/12  1706   First MD Initiated Contact with Patient 12/26/12 1920      Chief Complaint  Patient presents with  . Nausea    (Consider location/radiation/quality/duration/timing/severity/associated sxs/prior treatment) HPI Presents with epigastric pain and diarrhea onset 5 days ago. Pain is epigastric covering a 1 cm diameter area. Worse with eating. She denies fever patient reports that she had episodes of vomiting on March 18 and 12/22/2012, however has had no vomiting since. She has a full feeling in her abdomen after eating. She's had multiple episodes of diarrhea each day for the past 5 days complains of generalized weakness. She denies any fever she has stopped her Nexium because "I amnot eating" no other associated symptoms eating makes pain worse nothing makes pain improved pain is mild to moderate at present. She denies recent travel or antibiotics Past Medical History  Diagnosis Date  . Gastric polyp 07/23/12    Pathology: Chronic inactive gastritis.  EGD by Dr Arty Baumgartner (GI)   past medical history gastric reflux  History reviewed. No pertinent past surgical history. Surgical history cesarean section History reviewed. No pertinent family history.  History  Substance Use Topics  . Smoking status: Not on file  . Smokeless tobacco: Not on file  . Alcohol Use: Not on file   No tobacco no alcohol no drug OB History   Grav Para Term Preterm Abortions TAB SAB Ect Mult Living                  Review of Systems  Constitutional: Positive for fatigue.  Gastrointestinal: Positive for nausea, vomiting, abdominal pain and diarrhea.  Genitourinary:       Currently on menses  All other systems reviewed and are negative.    Allergies  Codeine and Prednisone  Home Medications   Current Outpatient Rx  Name  Route  Sig  Dispense  Refill  . cetirizine (ZYRTEC) 10 MG tablet   Oral   Take 10 mg by mouth daily.          Marland Kitchen esomeprazole (NEXIUM) 40 MG capsule   Oral   Take 1 capsule (40 mg total) by mouth daily. one hour before largest meal of the day   90 capsule   3     Prior authorization request for Nexium fax'd to Ex ...     BP 122/89  Pulse 73  Temp(Src) 98 F (36.7 C) (Oral)  Resp 16  SpO2 97%  LMP 11/28/2012  Physical Exam  Nursing note and vitals reviewed. Constitutional: She appears well-developed and well-nourished. No distress.  HENT:  Head: Normocephalic and atraumatic.  Mucous membranes dry  Eyes: Conjunctivae are normal. Pupils are equal, round, and reactive to light.  Neck: Neck supple. No tracheal deviation present. No thyromegaly present.  Cardiovascular: Normal rate and regular rhythm.   No murmur heard. Pulmonary/Chest: Effort normal and breath sounds normal.  Abdominal: Soft. Bowel sounds are normal. She exhibits no distension and no mass. There is tenderness. There is no rebound and no guarding.  Morbidly obese tender at epigastrium  Musculoskeletal: Normal range of motion. She exhibits no edema and no tenderness.  Neurological: She is alert. Coordination normal.  Skin: Skin is warm and dry. No rash noted.  Psychiatric: She has a normal mood and affect.    ED Course  Procedures (including critical care time)  Labs Reviewed  POCT I-STAT, CHEM 8 - Abnormal; Notable for the following:  Calcium, Ion 1.11 (*)    All other components within normal limits  LIPASE, BLOOD  HEPATIC FUNCTION PANEL  POCT I-STAT TROPONIN I   No results found. Results for orders placed during the hospital encounter of 12/26/12  LIPASE, BLOOD      Result Value Range   Lipase 20  11 - 59 U/L  HEPATIC FUNCTION PANEL      Result Value Range   Total Protein 6.4  6.0 - 8.3 g/dL   Albumin 3.2 (*) 3.5 - 5.2 g/dL   AST 35  0 - 37 U/L   ALT 50 (*) 0 - 35 U/L   Alkaline Phosphatase 98  39 - 117 U/L   Total Bilirubin 0.4  0.3 - 1.2 mg/dL   Bilirubin, Direct 0.1  0.0 - 0.3 mg/dL   Indirect  Bilirubin 0.3  0.3 - 0.9 mg/dL  CALCIUM      Result Value Range   Calcium 8.6  8.4 - 10.5 mg/dL  CBC      Result Value Range   WBC 9.9  4.0 - 10.5 K/uL   RBC 4.79  3.87 - 5.11 MIL/uL   Hemoglobin 13.2  12.0 - 15.0 g/dL   HCT 47.8  29.5 - 62.1 %   MCV 83.5  78.0 - 100.0 fL   MCH 27.6  26.0 - 34.0 pg   MCHC 33.0  30.0 - 36.0 g/dL   RDW 30.8  65.7 - 84.6 %   Platelets 309  150 - 400 K/uL  POCT I-STAT, CHEM 8      Result Value Range   Sodium 140  135 - 145 mEq/L   Potassium 4.2  3.5 - 5.1 mEq/L   Chloride 105  96 - 112 mEq/L   BUN 6  6 - 23 mg/dL   Creatinine, Ser 9.62  0.50 - 1.10 mg/dL   Glucose, Bld 86  70 - 99 mg/dL   Calcium, Ion 9.52 (*) 1.12 - 1.23 mmol/L   TCO2 26  0 - 100 mmol/L   Hemoglobin 14.6  12.0 - 15.0 g/dL   HCT 84.1  32.4 - 40.1 %  POCT I-STAT TROPONIN I      Result Value Range   Troponin i, poc 0.00  0.00 - 0.08 ng/mL   Comment 3            Dg Abd Acute W/chest  12/26/2012  *RADIOLOGY REPORT*  Clinical Data: Nausea, vomiting and diarrhea.  ACUTE ABDOMEN SERIES (ABDOMEN 2 VIEW & CHEST 1 VIEW)  Comparison: 09/12/2010.  Findings: The cardiac silhouette, mediastinal and hilar contours are within normal limits and stable.  The lungs are clear.  No pleural effusion.  Two views of the abdomen demonstrate an unremarkable bowel gas pattern.  No findings for small bowel obstruction or free air.  The soft tissue shadows are maintained.  No worrisome calcifications. Tubal ligation clips are noted.  The bony structures are intact.  IMPRESSION:  1.  No acute cardiopulmonary findings. 2.  Unremarkable abdominal radiographs.   Original Report Authenticated By: Rudie Meyer, M.D.     X-rays  viewed by me No diagnosis found.  9:15 PM patient feels much improved after treatment with intravenous fluids and protonix  MDM    Plan patient encouraged to avoid milk or dairy while having diarrhea. She reports to me that she is lactose intolerant and does not use milk  products. Imodium for diarrhea She is encouraged to continue to take Nexium as directed. Follow up with  family practice center if continuing to have diarrhea or does not feel better in 2 days Diagnosis enteritis       Doug Sou, MD 12/26/12 2126

## 2013-01-04 NOTE — ED Provider Notes (Signed)
Medical screening examination/treatment/procedure(s) were performed by resident physician or non-physician practitioner and as supervising physician I was immediately available for consultation/collaboration.   Graves Nipp DOUGLAS MD.   Cooper Stamp D Shantai Tiedeman, MD 01/04/13 1012 

## 2013-02-05 ENCOUNTER — Emergency Department (HOSPITAL_COMMUNITY): Payer: BC Managed Care – PPO

## 2013-02-05 ENCOUNTER — Encounter (HOSPITAL_COMMUNITY): Payer: Self-pay | Admitting: *Deleted

## 2013-02-05 ENCOUNTER — Observation Stay (HOSPITAL_COMMUNITY)
Admission: EM | Admit: 2013-02-05 | Discharge: 2013-02-09 | DRG: 813 | Disposition: A | Discharge status: Home / Self Care | Payer: BC Managed Care – PPO | Attending: Family Medicine | Admitting: Family Medicine

## 2013-02-05 DIAGNOSIS — E669 Obesity, unspecified: Secondary | ICD-10-CM

## 2013-02-05 DIAGNOSIS — K5289 Other specified noninfective gastroenteritis and colitis: Principal | ICD-10-CM | POA: Insufficient documentation

## 2013-02-05 DIAGNOSIS — E876 Hypokalemia: Secondary | ICD-10-CM | POA: Insufficient documentation

## 2013-02-05 DIAGNOSIS — Z87891 Personal history of nicotine dependence: Secondary | ICD-10-CM | POA: Insufficient documentation

## 2013-02-05 DIAGNOSIS — D649 Anemia, unspecified: Secondary | ICD-10-CM | POA: Insufficient documentation

## 2013-02-05 DIAGNOSIS — K529 Noninfective gastroenteritis and colitis, unspecified: Secondary | ICD-10-CM

## 2013-02-05 DIAGNOSIS — K219 Gastro-esophageal reflux disease without esophagitis: Secondary | ICD-10-CM | POA: Insufficient documentation

## 2013-02-05 DIAGNOSIS — K2900 Acute gastritis without bleeding: Secondary | ICD-10-CM

## 2013-02-05 DIAGNOSIS — J309 Allergic rhinitis, unspecified: Secondary | ICD-10-CM | POA: Insufficient documentation

## 2013-02-05 LAB — COMPREHENSIVE METABOLIC PANEL
ALT: 13 U/L (ref 0–35)
AST: 18 U/L (ref 0–37)
Albumin: 3.4 g/dL — ABNORMAL LOW (ref 3.5–5.2)
CO2: 26 mEq/L (ref 19–32)
Chloride: 100 mEq/L (ref 96–112)
GFR calc non Af Amer: >90 mL/min (ref >90)
Potassium: 3.4 mEq/L — ABNORMAL LOW (ref 3.5–5.1)
Sodium: 136 mEq/L (ref 135–145)
Total Bilirubin: 0.3 mg/dL (ref 0.3–1.2)

## 2013-02-05 LAB — URINALYSIS, ROUTINE W REFLEX MICROSCOPIC
Glucose, UA: NEGATIVE mg/dL
Ketones, ur: 15 mg/dL — AB
Protein, ur: NEGATIVE mg/dL
pH: 5.5 (ref 5.0–8.0)

## 2013-02-05 LAB — CBC WITH DIFFERENTIAL/PLATELET
Eosinophils Absolute: 0.1 10*3/uL (ref 0.0–0.7)
Eosinophils Relative: 1 % (ref 0–5)
HCT: 40.2 % (ref 36.0–46.0)
Lymphocytes Relative: 26 % (ref 12–46)
Lymphs Abs: 1.9 10*3/uL (ref 0.7–4.0)
MCH: 28.2 pg (ref 26.0–34.0)
MCV: 82 fL (ref 78.0–100.0)
Monocytes Absolute: 1 10*3/uL (ref 0.1–1.0)
Platelets: 278 10*3/uL (ref 150–400)
RBC: 4.9 MIL/uL (ref 3.87–5.11)
RDW: 14.2 % (ref 11.5–15.5)
WBC: 7.4 10*3/uL (ref 4.0–10.5)

## 2013-02-05 LAB — URINE MICROSCOPIC-ADD ON

## 2013-02-05 MED ORDER — IOHEXOL 300 MG/ML  SOLN
100.0000 mL | Freq: Once | INTRAMUSCULAR | Status: AC | PRN
  Administered 2013-02-05: 100 mL via INTRAVENOUS

## 2013-02-05 MED ORDER — IOHEXOL 300 MG/ML  SOLN
50.0000 mL | Freq: Once | INTRAMUSCULAR | Status: AC | PRN
  Administered 2013-02-05: 50 mL via ORAL

## 2013-02-05 MED ORDER — ONDANSETRON HCL 4 MG/2ML IJ SOLN
4.0000 mg | Freq: Once | INTRAMUSCULAR | Status: AC
  Administered 2013-02-05: 4 mg via INTRAVENOUS
  Filled 2013-02-05: qty 2

## 2013-02-05 NOTE — ED Notes (Signed)
Patient transported to CT 

## 2013-02-05 NOTE — ED Notes (Signed)
At bedside.  Pt c/o pain in mid abd x 2 days with fever of 101 at home this am

## 2013-02-05 NOTE — H&P (Signed)
Family Medicine Teaching [facility] Admission History and Physical Service Pager: [phone]  Patient name: Carla Berry Medical record number: [id number] Date of birth: [birth year] Age: 42 y.o. Gender: female  Primary Care Provider: MCDIARMID,TODD D, MD  Chief Complaint: abdominal pain  Assessment and Plan: Carla Berry is a 42 y.o. year old female with a history of GERD, obesity, and gastritis presenting with abdominal pain and loose stools.  1. Abd pain: likely related to infectious colitis given CT findings and recent loose stools/diarrhea. No signs of abscess or bowel perforation on CT abdomen. No concern for acute abdomen at this time. Must consider IBD though would think less likely given lack of bloody BMs and recent diarrheal illness in March. -will admit to med-surg, FMTS, attending Dr. Mauricio Po -clear liquid diet -NS 125 mL/hr -start on IV cipro and flagyl for treatment of infectious cause-likely can transition to PO tomorrow if tolerating PO intake -pain management with tylenol-patient with allergy to codeine, discussed use of percocet or vicodin and patient refused because she believed she'd had these in the past and reacted to them-stated she didn't need anything for pain at this time -zofran prn nausea -am BMET and CBC  2. GERD: on nexium at home -will place on protonix in hospital  3. Allergies: on zyrtec at home -claritin in hospital  4. Hypokalemia: likely related to diarrheal losses -will replete with KCl 40 mEq x2 doses -am BMET  FEN/GI: clear liquid diet, NS 125 mL/hr Prophylaxis: SQ heparin Disposition: admit to med-surg, pending improvement in PO intake and abdominal pain  History of Present Illness: Carla Berry is a 42 y.o. year old female with a history of GERD, obesity, and gastritis presenting with abdominal pain and loose stools.  States had abd pain and diarrhea and vomiting in March. This went away, though states didn't get back to feeling  normal. Now with dull abdominal pain since Thursday night mostly after eating. Friday morning developed intermittent sharp pain. Described as shooting in RLQ. Went to work and didn't feel right. Fever to 100 yesterday and 101 this morning. Last 3 mornings with loose stools. No other BMs the past few days. States feels tired. Eating well yesterday, though not today. Not drinking well today. Pain worse with eating. Denies blood in BM.  Notably in October of 2013 patient had normal abdominal US and normal Nuc Med Hepatobiliary Tc99 scan with cholecystikinin stimulation. Had EGD that showed mild antral gastritis and sessile gastric polyps that were benign on histopathology.   In the ED patient had CT abd/pelvis that revealed proximal ascending colonic wall thickening thought to be related to colitis. Had UA with signs of dehydration and no infection. CMET with potassium of 3.4 and normal LFTs. Normal lipase. Normal CBC.   Patient Active Problem List   Diagnosis Date Noted  . Gastritis, acute 07/16/2012  . TOBACCO USE, QUIT 12/11/2009  . PALPITATIONS, RECURRENT 07/31/2008  . FIBROIDS, UTERUS 02/08/2007  . OBESITY, NOS 12/03/2006  . MIGRAINE, UNSPEC., W/O INTRACTABLE MIGRAINE 12/03/2006  . RHINITIS, ALLERGIC 12/03/2006  . GASTROESOPHAGEAL REFLUX, NO ESOPHAGITIS 12/03/2006  . 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 PREDIABETES 12/03/2006  . PAPANICOLAOU SMEAR, ABNORMAL 12/03/2006  . 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 Gastric polyp 07/23/12    Pathology: Chronic inactive gastritis.  EGD by Dr Arty Baumgartner (GI)   Past Surgical History: C/s X2 and btl  Social History: History  Substance Use Topics  .  Smoking status: Never Smoker   . Smokeless tobacco: Not on file  . Alcohol Use: Not on file   For any additional social history documentation, please refer to relevant sections of EMR.  Family History: No family history on  file.  Allergies: Allergies  Allergen Reactions  . Codeine Nausea And Vomiting and Other (See Comments)    Causes Dizziness, passes out   . Prednisone Nausea And Vomiting    Severe nausea and vomiting for days     No current facility-administered medications on file prior to encounter.   Current Outpatient Prescriptions on File Prior to Encounter  Medication Sig Dispense Refill  . cetirizine (ZYRTEC) 10 MG tablet Take 10 mg by mouth daily.      Marland Kitchen esomeprazole (NEXIUM) 40 MG capsule Take 1 capsule (40 mg total) by mouth daily. one hour before largest meal of the day  90 capsule  3   Review Of Systems: Per HPI with the following additions: none Otherwise 12 point review of systems was performed and was unremarkable.  Physical Exam: BP 107/70  Pulse 69  Temp(Src) 98.5 F (36.9 C) (Oral)  Resp 20  SpO2 98%  LMP 01/15/2013 Exam: General: NAD, resting comfortably in bed HEENT: NCAT, MMM, sclera clear Cardiovascular: rrr, no mrg Respiratory: CTAB, no wheezes Abdomen: soft, TTP in left mid-abdomen, no rebound Extremities: no edema Skin: no lesions noted Neuro: alert, no focal abnormalities  Labs and Imaging: CBC BMET   Recent Labs Lab 02/05/13 1924  WBC 7.4  HGB 13.8  HCT 40.2  PLT 278       Recent Labs Lab 02/05/13 1924  NA 136  K 3.4*  CL 100  CO2 26  BUN 8  CREATININE 0.80  GLUCOSE 87  CALCIUM 9.1     Results for orders placed during the hospital encounter of 02/05/13 (from the past 24 hour(s))  COMPREHENSIVE METABOLIC PANEL     Status: Abnormal   Collection Time    02/05/13  7:24 PM      Result Value Range   Sodium 136  135 - 145 mEq/L   Potassium 3.4 (*) 3.5 - 5.1 mEq/L   Chloride 100  96 - 112 mEq/L   CO2 26  19 - 32 mEq/L   Glucose, Bld 87  70 - 99 mg/dL   BUN 8  6 - 23 mg/dL   Creatinine, Ser 1.61  0.50 - 1.10 mg/dL   Calcium 9.1  8.4 - 09.6 mg/dL   Total Protein 7.2  6.0 - 8.3 g/dL   Albumin 3.4 (*) 3.5 - 5.2 g/dL   AST 18  0 - 37  U/L   ALT 13  0 - 35 U/L   Alkaline Phosphatase 107  39 - 117 U/L   Total Bilirubin 0.3  0.3 - 1.2 mg/dL   GFR calc non Af Amer >90  >90 mL/min   GFR calc Af Amer >90  >90 mL/min  LIPASE, BLOOD     Status: None   Collection Time    02/05/13  7:24 PM      Result Value Range   Lipase 22  11 - 59 U/L  CBC WITH DIFFERENTIAL     Status: Abnormal   Collection Time    02/05/13  7:24 PM      Result Value Range   WBC 7.4  4.0 - 10.5 K/uL   RBC 4.90  3.87 - 5.11 MIL/uL   Hemoglobin 13.8  12.0 - 15.0 g/dL   HCT  40.2  36.0 - 46.0 %   MCV 82.0  78.0 - 100.0 fL   MCH 28.2  26.0 - 34.0 pg   MCHC 34.3  30.0 - 36.0 g/dL   RDW 32.4  40.1 - 02.7 %   Platelets 278  150 - 400 K/uL   Neutrophils Relative 60  43 - 77 %   Neutro Abs 4.5  1.7 - 7.7 K/uL   Lymphocytes Relative 26  12 - 46 %   Lymphs Abs 1.9  0.7 - 4.0 K/uL   Monocytes Relative 13 (*) 3 - 12 %   Monocytes Absolute 1.0  0.1 - 1.0 K/uL   Eosinophils Relative 1  0 - 5 %   Eosinophils Absolute 0.1  0.0 - 0.7 K/uL   Basophils Relative 0  0 - 1 %   Basophils Absolute 0.0  0.0 - 0.1 K/uL  URINALYSIS, ROUTINE W REFLEX MICROSCOPIC     Status: Abnormal   Collection Time    02/05/13  8:24 PM      Result Value Range   Color, Urine YELLOW  YELLOW   APPearance CLOUDY (*) CLEAR   Specific Gravity, Urine 1.026  1.005 - 1.030   pH 5.5  5.0 - 8.0   Glucose, UA NEGATIVE  NEGATIVE mg/dL   Hgb urine dipstick MODERATE (*) NEGATIVE   Bilirubin Urine SMALL (*) NEGATIVE   Ketones, ur 15 (*) NEGATIVE mg/dL   Protein, ur NEGATIVE  NEGATIVE mg/dL   Urobilinogen, UA 1.0  0.0 - 1.0 mg/dL   Nitrite NEGATIVE  NEGATIVE   Leukocytes, UA NEGATIVE  NEGATIVE  PREGNANCY, URINE     Status: None   Collection Time    02/05/13  8:24 PM      Result Value Range   Preg Test, Ur NEGATIVE  NEGATIVE  URINE MICROSCOPIC-ADD ON     Status: Abnormal   Collection Time    02/05/13  8:24 PM      Result Value Range   Squamous Epithelial / LPF FEW (*) RARE   RBC / HPF 0-2   <3 RBC/hpf   Bacteria, UA MANY (*) RARE   Urine-Other MUCOUS PRESENT     Ct Abdomen Pelvis W Contrast  02/05/2013   IMPRESSION: Mild cecal and proximal ascending colonic wall thickening may reflect a nonspecific colitis.  If symptoms persist, recommend colonoscopic follow-up.   Original Report Authenticated By: Jearld Lesch, M.D.     [name], MD 02/05/2013, 11:45 PM  PGY 3 Addendum to PGY1 H & P: I saw and examined this patient with Dr. Birdie Sons and agree with the above findings and plan.   Indiyah Paone 02/06/2013 12:58 AM

## 2013-02-05 NOTE — ED Provider Notes (Signed)
History     CSN: [id number]  Arrival date & time 02/05/13  1900   First MD Initiated Contact with Patient 02/05/13 1906      Chief Complaint  Patient presents with  . Abdominal Pain     HPI Thursday - dull pain lower stomach; Friday - sharp double over pains. Started having a fever - 101 F. Pain is still present. Taking tylenol for the fever.   Past Medical History  Diagnosis Date  . Gastric polyp 07/23/12    Pathology: Chronic inactive gastritis.  EGD by Dr Arty Baumgartner (GI)    Past Surgical History  Procedure Laterality Date  . Tubal ligation    . Tonsillectomy    . Cesarean section      x 2  . Colonoscopy N/A 02/08/2013    Procedure: COLONOSCOPY;  Surgeon: Theda Belfast, MD;  Location[facility] ENDOSCOPY;  Service: Endoscopy;  Laterality: N/A;    History reviewed. No pertinent family history.  History  Substance Use Topics  . Smoking status: Former Smoker    Quit date: 07/07/2003  . Smokeless tobacco: Not on file  . Alcohol Use: No    OB History   Grav Para Term Preterm Abortions TAB SAB Ect Mult Living                  Review of Systems  All other systems reviewed and are negative.    Allergies  Codeine and Prednisone  Home Medications   Current Outpatient Rx  Name  Route  Sig  Dispense  Refill  . acetaminophen (TYLENOL) 500 MG tablet   Oral   Take 500-1,000 mg by mouth every 6 (six) hours as needed for pain or fever.         . cetirizine (ZYRTEC) 10 MG tablet   Oral   Take 10 mg by mouth daily.         Marland Kitchen esomeprazole (NEXIUM) 40 MG capsule   Oral   Take 1 capsule (40 mg total) by mouth daily. one hour before largest meal of the day   90 capsule   3     Prior authorization request for Nexium fax'd to [employer] ...   . Probiotic Product (PROBIOTIC DAILY PO)   Oral   Take 1 capsule by mouth daily as needed.         . ciprofloxacin (CIPRO) 500 MG tablet   Oral   Take 1 tablet (500 mg total) by mouth 2 (two) times daily.   20 tablet   0   .  metroNIDAZOLE (FLAGYL) 500 MG tablet   Oral   Take 1 tablet (500 mg total) by mouth every 8 (eight) hours.   30 tablet   0   . ondansetron (ZOFRAN) 4 MG tablet   Oral   Take 1 tablet (4 mg total) by mouth every 6 (six) hours as needed for nausea.   20 tablet   0     BP 115/58  Pulse 72  Temp(Src) 98.6 F (37 C) (Oral)  Resp 20  Ht 5\' 3"  (1.6 m)  Wt 248 lb (112.492 kg)  BMI 43.94 kg/m2  SpO2 98%  LMP 01/15/2013  Physical Exam  Nursing note and vitals reviewed. Constitutional: She is oriented to person, place, and time. She appears well-developed and well-nourished. No distress.  HENT:  Head: Normocephalic and atraumatic.  Eyes: Pupils are equal, round, and reactive to light.  Neck: Normal range of motion.  Cardiovascular: Normal rate and intact distal  pulses.   Pulmonary/Chest: No respiratory distress.  Abdominal: Normal appearance. She exhibits no distension. There is tenderness. There is no rigidity, no rebound and no guarding.    Musculoskeletal: Normal range of motion.  Neurological: She is alert and oriented to person, place, and time. No cranial nerve deficit.  Skin: Skin is warm and dry. No rash noted.  Psychiatric: She has a normal mood and affect. Her behavior is normal.    ED Course  Procedures (including critical care time)  Labs Reviewed  COMPREHENSIVE METABOLIC PANEL - Abnormal; Notable for the following:    Potassium 3.4 (*)    Albumin 3.4 (*)    All other components within normal limits  CBC WITH DIFFERENTIAL - Abnormal; Notable for the following:    Monocytes Relative 13 (*)    All other components within normal limits  URINALYSIS, ROUTINE W REFLEX MICROSCOPIC - Abnormal; Notable for the following:    APPearance CLOUDY (*)    Hgb urine dipstick MODERATE (*)    Bilirubin Urine SMALL (*)    Ketones, ur 15 (*)    All other components within normal limits  URINE MICROSCOPIC-ADD ON - Abnormal; Notable for the following:    Squamous Epithelial /  LPF FEW (*)    Bacteria, UA MANY (*)    All other components within normal limits  BASIC METABOLIC PANEL - Abnormal; Notable for the following:    BUN 5 (*)    Calcium 8.3 (*)    All other components within normal limits  CBC - Abnormal; Notable for the following:    Hemoglobin 11.7 (*)    HCT 35.4 (*)    All other components within normal limits  BASIC METABOLIC PANEL - Abnormal; Notable for the following:    BUN 5 (*)    GFR calc non Af Amer 86 (*)    All other components within normal limits  BASIC METABOLIC PANEL - Abnormal; Notable for the following:    Glucose, Bld 105 (*)    Calcium 7.9 (*)    GFR calc non Af Amer 86 (*)    All other components within normal limits  CBC - Abnormal; Notable for the following:    Hemoglobin 11.6 (*)    HCT 34.6 (*)    All other components within normal limits  STOOL CULTURE  CLOSTRIDIUM DIFFICILE BY PCR  LIPASE, BLOOD  PREGNANCY, URINE  BASIC METABOLIC PANEL  CBC  CBC  OCCULT BLOOD X 1 CARD TO LAB, STOOL   No results found.   1. Colitis   2. Allergic rhinitis, cause unspecified   3. Esophageal reflux   4. Obesity, unspecified   5. Gastritis, acute   6. Anemia       MDM          Nelia Shi, MD 02/09/13 2314

## 2013-02-05 NOTE — ED Notes (Signed)
Thursday - dull pain lower stomach; Friday - sharp double over pains. Started having a fever - 101 F. Pain is still present. Taking tylenol for the fever.

## 2013-02-05 NOTE — ED Notes (Signed)
Pt remains in CT. duaghter at bedside

## 2013-02-05 NOTE — ED Notes (Signed)
Dr Gwenevere Abbot at bedside with [place]

## 2013-02-05 NOTE — ED Notes (Signed)
Pt began drinking PO contrast

## 2013-02-06 DIAGNOSIS — K2900 Acute gastritis without bleeding: Secondary | ICD-10-CM

## 2013-02-06 LAB — CBC
MCV: 81.4 fL (ref 78.0–100.0)
Platelets: 252 10*3/uL (ref 150–400)
RBC: 4.68 MIL/uL (ref 3.87–5.11)
RDW: 14.4 % (ref 11.5–15.5)
WBC: 6.2 10*3/uL (ref 4.0–10.5)

## 2013-02-06 LAB — BASIC METABOLIC PANEL
CO2: 26 mEq/L (ref 19–32)
Calcium: 8.5 mg/dL (ref 8.4–10.5)
GFR calc Af Amer: >90 mL/min (ref >90)
GFR calc non Af Amer: >90 mL/min (ref >90)
Sodium: 137 mEq/L (ref 135–145)

## 2013-02-06 MED ORDER — BIOTENE DRY MOUTH MT LIQD: 15.0000 mL | Freq: Two times a day (BID) | OROMUCOSAL | Status: DC

## 2013-02-06 MED ORDER — ACETAMINOPHEN 325 MG PO TABS
650.0000 mg | ORAL_TABLET | Freq: Four times a day (QID) | ORAL | Status: DC | PRN
  Administered 2013-02-06 – 2013-02-09 (×5): 650 mg via ORAL
  Filled 2013-02-06 (×5): qty 2

## 2013-02-06 MED ORDER — PANTOPRAZOLE SODIUM 40 MG PO TBEC
80.0000 mg | DELAYED_RELEASE_TABLET | Freq: Every day | ORAL | Status: DC
  Administered 2013-02-06 – 2013-02-09 (×4): 80 mg via ORAL
  Filled 2013-02-06: qty 2
  Filled 2013-02-06 (×2): qty 1
  Filled 2013-02-06: qty 2
  Filled 2013-02-06 (×2): qty 1

## 2013-02-06 MED ORDER — METRONIDAZOLE IN NACL 5-0.79 MG/ML-% IV SOLN
500.0000 mg | Freq: Three times a day (TID) | INTRAVENOUS | Status: DC
  Administered 2013-02-06 – 2013-02-09 (×11): 500 mg via INTRAVENOUS
  Filled 2013-02-06 (×12): qty 100

## 2013-02-06 MED ORDER — ACETAMINOPHEN 650 MG RE SUPP: 650.0000 mg | Freq: Four times a day (QID) | RECTAL | Status: DC | PRN

## 2013-02-06 MED ORDER — LORATADINE 10 MG PO TABS
10.0000 mg | ORAL_TABLET | Freq: Every day | ORAL | Status: DC
  Administered 2013-02-06 – 2013-02-09 (×2): 10 mg via ORAL
  Filled 2013-02-06 (×5): qty 1

## 2013-02-06 MED ORDER — CIPROFLOXACIN IN D5W 400 MG/200ML IV SOLN
400.0000 mg | Freq: Two times a day (BID) | INTRAVENOUS | Status: DC
  Administered 2013-02-06 – 2013-02-09 (×7): 400 mg via INTRAVENOUS
  Filled 2013-02-06 (×8): qty 200

## 2013-02-06 MED ORDER — ONDANSETRON HCL 4 MG/2ML IJ SOLN
4.0000 mg | Freq: Four times a day (QID) | INTRAMUSCULAR | Status: DC | PRN
  Administered 2013-02-07 (×2): 4 mg via INTRAVENOUS
  Filled 2013-02-06 (×2): qty 2

## 2013-02-06 MED ORDER — POTASSIUM CHLORIDE CRYS ER 20 MEQ PO TBCR: 40.0000 meq | EXTENDED_RELEASE_TABLET | Freq: Once | ORAL | Status: DC

## 2013-02-06 MED ORDER — POTASSIUM CHLORIDE CRYS ER 20 MEQ PO TBCR
40.0000 meq | EXTENDED_RELEASE_TABLET | Freq: Two times a day (BID) | ORAL | Status: AC
  Administered 2013-02-06 (×2): 40 meq via ORAL
  Filled 2013-02-06 (×3): qty 2

## 2013-02-06 MED ORDER — SODIUM CHLORIDE 0.9 % IV SOLN
INTRAVENOUS | Status: DC
  Administered 2013-02-06 – 2013-02-09 (×7): via INTRAVENOUS

## 2013-02-06 MED ORDER — HEPARIN SODIUM (PORCINE) 5000 UNIT/ML IJ SOLN
5000.0000 [IU] | Freq: Three times a day (TID) | INTRAMUSCULAR | Status: DC
  Administered 2013-02-06 – 2013-02-09 (×9): 5000 [IU] via SUBCUTANEOUS
  Filled 2013-02-06 (×13): qty 1

## 2013-02-06 MED ORDER — ONDANSETRON HCL 4 MG PO TABS: 4.0000 mg | ORAL_TABLET | Freq: Four times a day (QID) | ORAL | Status: DC | PRN

## 2013-02-06 MED ORDER — HYDROCODONE-ACETAMINOPHEN 5-325 MG PO TABS: 1.0000 | ORAL_TABLET | Freq: Four times a day (QID) | ORAL | Status: DC | PRN

## 2013-02-06 MED ORDER — CHLORHEXIDINE GLUCONATE 0.12 % MT SOLN: 15.0000 mL | Freq: Two times a day (BID) | OROMUCOSAL | Status: DC

## 2013-02-06 NOTE — H&P (Signed)
FMTS Attending Admit Note Patient seen and examined by me, discussed with resident team and I agree with Dr Melina Modena assess/plan in this H&P.  Patient recounts onset of RLQ abdominal pain in mid-March, which improved with IV fluids and bowel rest at that time.  She has not had a complete recovery since that time, and now presents with a more pronounced and sudden worsening of her abdominal pain and non-bloody diarrhea. EGD last fall with Dr Loreta Ave, as per Dr Melina Modena note.  Patient is unaware of ever having a colonoscopy.  This morning she reports feeling little change from time of admission.  Her abdomen with audible bowel sounds; soft and with tenderness on L only; no guarding or rigidity.  No masses or megaly.  Agree with plan for treatment as for infectious colitis with IV hydration; Cipro-Flagyl; C diff and stool cx.  Advance diet as tolerated.  To involve her GI team in this hospitalization if not making improvement as expected; otherwise, may schedule for follow up by GI as an outpatient.  Paula Compton, MD

## 2013-02-06 NOTE — Progress Notes (Signed)
Family Medicine Teaching Service Daily Progress Note Service Pager: [phone]  Subjective: Patient says she is not feeling much better. Is having clear diarrhea, pain still there, but does not want narcotics.   Objective: Vital signs in last 24 hours: Filed Vitals:   02/05/13 2200 02/05/13 2300 02/06/13 0016 02/06/13 0500  BP: 107/70 114/68 114/73 105/72  Pulse: 69 71  68  Temp: 98.5 F (36.9 C)  98.1 F (36.7 C) 97.5 F (36.4 C)  TempSrc: Oral     Resp: 20  17 16   Height:    5\' 3"  (1.6 m)  Weight:    248 lb (112.492 kg)  SpO2: 98% 95% 97% 95%   Weight change:  No intake or output data in the 24 hours ending 02/06/13 0753 Exam:  General: NAD, resting comfortably in bed with clear liquid food tray in front of her (has not eaten any) HEENT: NCAT, MMM, sclera clear  Cardiovascular: rrr, no mrg  Respiratory: CTAB, no wheezes  Abdomen: soft, obese, +TTP in left mid-abdomen, no rebound  Extremities: no edema  Skin: no lesions noted  Neuro: alert, no focal abnormalities  Lab Results:  Recent Labs  02/05/13 1924 02/06/13 0605  WBC 7.4 6.2  HGB 13.8 12.8  HCT 40.2 38.1  PLT 278 252    Recent Labs  02/05/13 1924 02/06/13 0605  NA 136 137  K 3.4* 3.8  CL 100 105  CO2 26 26  GLUCOSE 87 89  BUN 8 6  CREATININE 0.80 0.78  CALCIUM 9.1 8.5   Micro Results: No results found for this or any previous visit (from the past 240 hour(s)). Studies/Results:  Medications: I have reviewed the patient's current medications. Scheduled Meds: . ciprofloxacin  400 mg Intravenous Q12H  . heparin  5,000 Units Subcutaneous Q8H  . loratadine  10 mg Oral Daily  . metronidazole  500 mg Intravenous Q8H  . pantoprazole  80 mg Oral Q1200  . potassium chloride  40 mEq Oral BID   Continuous Infusions: . sodium chloride 125 mL/hr at 02/06/13 0039   PRN Meds:.acetaminophen, acetaminophen, ondansetron (ZOFRAN) IV, ondansetron  Assessment/Plan: Carla Berry is a 42 y.o. year old  female with a history of GERD, obesity, and gastritis presenting with abdominal pain and loose stools, CT Scan concerning for colitis:  1. Abd pain: likely related to infectious colitis given CT findings and recent loose stools/diarrhea.  -clear liquid diet  -NS 125 mL/hr  - IV cipro and flagyl for treatment of infectious cause-likely can transition to PO when clinically improved.  -pain management: patient does not want narcotics, will continue tylenol, she has requested a heating pad the I will order -zofran prn nausea  -am BMET and CBC   2. GERD: on nexium at home  -will place on protonix in hospital   3. Allergies: on zyrtec at home  -claritin in hospital   4. Hypokalemia: likely related to diarrheal losses  -improved today -am BMET   FEN/GI: clear liquid diet, NS 125 mL/hr Prophylaxis: SQ heparin  Disposition:  pending improvement in PO intake and abdominal pain   Dailey Alberson 02/06/2013, 7:53 AM

## 2013-02-07 LAB — CBC
HCT: 35.4 % — ABNORMAL LOW (ref 36.0–46.0)
Hemoglobin: 11.7 g/dL — ABNORMAL LOW (ref 12.0–15.0)
MCV: 82.9 fL (ref 78.0–100.0)
RBC: 4.27 MIL/uL (ref 3.87–5.11)
WBC: 5.4 10*3/uL (ref 4.0–10.5)

## 2013-02-07 LAB — BASIC METABOLIC PANEL
CO2: 22 mEq/L (ref 19–32)
Chloride: 107 mEq/L (ref 96–112)
GFR calc Af Amer: >90 mL/min (ref >90)
Potassium: 3.6 mEq/L (ref 3.5–5.1)
Sodium: 138 mEq/L (ref 135–145)

## 2013-02-07 MED ORDER — PEG 3350-KCL-NA BICARB-NACL 420 G PO SOLR
4000.0000 mL | Freq: Once | ORAL | Status: AC
  Administered 2013-02-07: 4000 mL via ORAL
  Filled 2013-02-07: qty 4000

## 2013-02-07 MED ORDER — KETOROLAC TROMETHAMINE 30 MG/ML IJ SOLN
30.0000 mg | Freq: Four times a day (QID) | INTRAMUSCULAR | Status: DC | PRN
  Administered 2013-02-07: 30 mg via INTRAVENOUS
  Filled 2013-02-07: qty 1

## 2013-02-07 NOTE — Progress Notes (Signed)
Family Medicine Teaching Service Daily Progress Note Service Pager: [phone]  Subjective: Continues to not feel well. Abd pain is no better. Had one BM yesterday. Refused vicodin for pain relief as has allergy.  Objective: Vital signs in last 24 hours: Filed Vitals:   02/06/13 0016 02/06/13 0500 02/06/13 2121 02/07/13 0445  BP: 114/73 105/72 110/76 123/77  Pulse:  68 64 53  Temp: 98.1 F (36.7 C) 97.5 F (36.4 C) 97.8 F (36.6 C) 97.8 F (36.6 C)  TempSrc:   Oral   Resp: 17 16 20 19   Height:  5\' 3"  (1.6 m)    Weight:  248 lb (112.492 kg)    SpO2: 97% 95% 97% 97%   Weight change:   Intake/Output Summary (Last 24 hours) at 02/07/13 0917 Last data filed at 02/06/13 1815  Gross per 24 hour  Intake    240 ml  Output      0 ml  Net    240 ml   Exam:  General: NAD, resting comfortably HEENT: NCAT, MMM, sclera clear  Cardiovascular: rrr, no mrg  Respiratory: CTAB, no wheezes  Abdomen: soft, obese, +TTP in left mid-abdomen, no rebound  Extremities: no edema  Skin: no lesions noted  Neuro: alert, no focal abnormalities  Lab Results:  Recent Labs  02/06/13 0605 02/07/13 0432  WBC 6.2 5.4  HGB 12.8 11.7*  HCT 38.1 35.4*  PLT 252 279    Recent Labs  02/06/13 0605 02/07/13 0432  NA 137 138  K 3.8 3.6  CL 105 107  CO2 26 22  GLUCOSE 89 95  BUN 6 5*  CREATININE 0.78 0.73  CALCIUM 8.5 8.3*   Micro Results: No results found for this or any previous visit (from the past 240 hour(s)). Studies/Results:  Medications: I have reviewed the patient's current medications. Scheduled Meds: . ciprofloxacin  400 mg Intravenous Q12H  . heparin  5,000 Units Subcutaneous Q8H  . loratadine  10 mg Oral Daily  . metronidazole  500 mg Intravenous Q8H  . pantoprazole  80 mg Oral Q1200   Continuous Infusions: . sodium chloride 125 mL/hr at 02/07/13 0059   PRN Meds:.acetaminophen, acetaminophen, HYDROcodone-acetaminophen, ondansetron (ZOFRAN) IV,  ondansetron  Assessment/Plan: JAKIERA EHLER is a 42 y.o. year old female with a history of GERD, obesity, and gastritis presenting with abdominal pain and loose stools, CT Scan concerning for colitis:   1. Abd pain: potentially related to infectious colitis given CT findings and recent loose stools/diarrhea, though with minimal improvement in symptoms on antibiotics must consider other causes of colitis, specifically IDB.  -clear liquid diet  -NS 125 mL/hr  - IV cipro and flagyl for treatment of infectious cause-likely can transition to PO when clinically improved.  -pain management: pt does not want narcotics, so will place order for toradol -zofran prn nausea -consider addition of prednisone for potential IBD flare -am BMET and CBC   2. GERD: on nexium at home  -will place on protonix in hospital   3. Allergies: on zyrtec at home  -claritin in hospital   4. Hypokalemia: likely related to diarrheal losses  -improved today -am BMET   5. Anemia: dropped from yesterday. Potentially related to dilutional effect, though could be related to colitis. -will check FOBT today -CBC in AM  FEN/GI: clear liquid diet, NS 125 mL/hr Prophylaxis: SQ heparin  Disposition:  pending improvement in PO intake and abdominal pain   [name] 02/07/2013, 9:17 AM

## 2013-02-07 NOTE — Progress Notes (Signed)
FMTS Attending Admission Note: Mandrell Vangilder,MD I  have seen and examined this patient, reviewed their chart. I have discussed this patient with the resident. I agree with the resident's findings, assessment and care plan.  Briefly patient this morning stated she continues to have abdominal pain which was worse last night,also daily watery stool. GI exam was benign this morning. I suggested at this time we can get stool testing done (Guaiac,C.Diff,stool culture) and also get gastroenterology involved since patient does not seem to be improving.

## 2013-02-07 NOTE — Consult Note (Signed)
Reason for Consult: Abdominal pain/abnormal CT scan-?colitis. Referring Physician: Dr. Minerva Areola Sonnenberg/Dr. Khinde Eniola-FPTS.  Carla Berry is an 42 y.o. female.  HPI: 41 year old, morbidly obese female, who was in her usual state of health till 02/01/13 when she developed diarrhea followed by fever and chills to 101 F. She claims she has had several loose stools prior to admission with 1 BM yesterday and none today. She complains of diffuse abdominal pain that in unrelenting in nature. She feels her entire abdomen "hurts". She has had severe nausea today as well. Has been on Ciprofloxacin and Flagyl since admission. She denies a history of vomiting, melena, hematochezia, dysphagia or odynophagia. She was hospitalized in March, this year for similar symptoms.    Past Medical History  Diagnosis Date  . Gastric polyp 07/23/12    Pathology: Chronic inactive gastritis.  EGD by Dr Arty Baumgartner (GI)   History reviewed. No pertinent past surgical history.  No family history on file. She denies a family history of IBD, GI cancers or celiac sprue.  Social History:  reports that she has never smoked. She does not have any smokeless tobacco history on file. She reports that she does not use illicit drugs. Her alcohol history is not on file.  Allergies:  Allergies  Allergen Reactions  . Codeine Nausea And Vomiting and Other (See Comments)    Causes Dizziness, passes out   . Prednisone Nausea And Vomiting    Severe nausea and vomiting for days     Medications: I have reviewed the patient's current medications.  Results for orders placed during the hospital encounter of 02/05/13 (from the past 48 hour(s))  URINALYSIS, ROUTINE W REFLEX MICROSCOPIC     Status: Abnormal   Collection Time    02/05/13  8:24 PM      Result Value Range   Color, Urine YELLOW  YELLOW   APPearance CLOUDY (*) CLEAR   Specific Gravity, Urine 1.026  1.005 - 1.030   pH 5.5  5.0 - 8.0   Glucose, UA NEGATIVE  NEGATIVE mg/dL    Hgb urine dipstick MODERATE (*) NEGATIVE   Bilirubin Urine SMALL (*) NEGATIVE   Ketones, ur 15 (*) NEGATIVE mg/dL   Protein, ur NEGATIVE  NEGATIVE mg/dL   Urobilinogen, UA 1.0  0.0 - 1.0 mg/dL   Nitrite NEGATIVE  NEGATIVE   Leukocytes, UA NEGATIVE  NEGATIVE  PREGNANCY, URINE     Status: None   Collection Time    02/05/13  8:24 PM      Result Value Range   Preg Test, Ur NEGATIVE  NEGATIVE   Comment:            THE SENSITIVITY OF THIS     METHODOLOGY IS >20 mIU/mL.  URINE MICROSCOPIC-ADD ON     Status: Abnormal   Collection Time    02/05/13  8:24 PM      Result Value Range   Squamous Epithelial / LPF FEW (*) RARE   RBC / HPF 0-2  <3 RBC/hpf   Bacteria, UA MANY (*) RARE   Urine-Other MUCOUS PRESENT    BASIC METABOLIC PANEL     Status: None   Collection Time    02/06/13  6:05 AM      Result Value Range   Sodium 137  135 - 145 mEq/L   Potassium 3.8  3.5 - 5.1 mEq/L   Chloride 105  96 - 112 mEq/L   CO2 26  19 - 32 mEq/L   Glucose,  Bld 89  70 - 99 mg/dL   BUN 6  6 - 23 mg/dL   Creatinine, Ser 4.54  0.50 - 1.10 mg/dL   Calcium 8.5  8.4 - 09.8 mg/dL   GFR calc non Af Amer >90  >90 mL/min   GFR calc Af Amer >90  >90 mL/min   Comment:            The eGFR has been calculated     using the CKD EPI equation.     This calculation has not been     validated in all clinical     situations.     eGFR's persistently     <90 mL/min signify     possible Chronic Kidney Disease.  CBC     Status: None   Collection Time    02/06/13  6:05 AM      Result Value Range   WBC 6.2  4.0 - 10.5 K/uL   RBC 4.68  3.87 - 5.11 MIL/uL   Hemoglobin 12.8  12.0 - 15.0 g/dL   HCT 11.9  14.7 - 82.9 %   MCV 81.4  78.0 - 100.0 fL   MCH 27.4  26.0 - 34.0 pg   MCHC 33.6  30.0 - 36.0 g/dL   RDW 56.2  13.0 - 86.5 %   Platelets 252  150 - 400 K/uL  BASIC METABOLIC PANEL     Status: Abnormal   Collection Time    02/07/13  4:32 AM      Result Value Range   Sodium 138  135 - 145 mEq/L   Potassium 3.6   3.5 - 5.1 mEq/L   Chloride 107  96 - 112 mEq/L   CO2 22  19 - 32 mEq/L   Glucose, Bld 95  70 - 99 mg/dL   BUN 5 (*) 6 - 23 mg/dL   Creatinine, Ser 7.84  0.50 - 1.10 mg/dL   Calcium 8.3 (*) 8.4 - 10.5 mg/dL   GFR calc non Af Amer >90  >90 mL/min   GFR calc Af Amer >90  >90 mL/min   Comment:            The eGFR has been calculated     using the CKD EPI equation.     This calculation has not been     validated in all clinical     situations.     eGFR's persistently     <90 mL/min signify     possible Chronic Kidney Disease.  CBC     Status: Abnormal   Collection Time    02/07/13  4:32 AM      Result Value Range   WBC 5.4  4.0 - 10.5 K/uL   RBC 4.27  3.87 - 5.11 MIL/uL   Hemoglobin 11.7 (*) 12.0 - 15.0 g/dL   HCT 69.6 (*) 29.5 - 28.4 %   MCV 82.9  78.0 - 100.0 fL   MCH 27.4  26.0 - 34.0 pg   MCHC 33.1  30.0 - 36.0 g/dL   RDW 13.2  44.0 - 10.2 %   Platelets 279  150 - 400 K/uL   Ct Abdomen Pelvis W Contrast  02/05/2013  *RADIOLOGY REPORT*  Clinical Data: Abdominal pain  CT ABDOMEN AND PELVIS WITH CONTRAST  Technique:  Multidetector CT imaging of the abdomen and pelvis was performed following the standard protocol during bolus administration of intravenous contrast.  Contrast: OMNIPAQUE IOHEXOL 300 MG/ML  SOLN  Comparison: 12/26/2012 radiographs, 05/10/2009  CT  Findings: Lung bases are predominately clear.  Normal heart size.  Unremarkable liver, biliary system, spleen, pancreas, adrenal glands.  Lobular renal contours with symmetric enhancement.  No hydronephrosis or hydroureter.  Circumferential thickening of the cecum/proximal ascending colon. Normal appendix.  Reactive size ileocolic lymph nodes.  Small bowel loops are normal course and caliber.  No free intraperitoneal air or fluid.  No lymphadenopathy. Reactive size ileocolic nodes.  Small fat containing umbilical hernia.  Normal caliber aorta and branch vessels.  Partially decompressed bladder.  Unremarkable CT appearance to  the uterus and adnexa.  Bilateral tubal ligation clips.  No acute osseous finding.  IMPRESSION: Mild cecal and proximal ascending colonic wall thickening may reflect a nonspecific colitis.  If symptoms persist, recommend colonoscopic follow-up.   Original Report Authenticated By: Jearld Lesch, M.D.    Review of Systems  Constitutional: Positive for fever, chills, malaise/fatigue and diaphoresis. Negative for weight loss.  Eyes: Negative.   Respiratory: Negative for cough, sputum production, shortness of breath and wheezing.   Cardiovascular: Negative.   Gastrointestinal: Positive for nausea, abdominal pain and diarrhea. Negative for vomiting, blood in stool and melena.  Genitourinary: Negative.   Skin: Negative.   Neurological: Positive for weakness and headaches.  Endo/Heme/Allergies: Negative.   Psychiatric/Behavioral: Negative.    Blood pressure 118/90, pulse 56, temperature 97.8 F (36.6 C), temperature source Oral, resp. rate 20, height 5\' 3"  (1.6 m), weight 112.492 kg (248 lb), last menstrual period 01/15/2013, SpO2 100.00%. Physical Exam  Constitutional: She is oriented to person, place, and time. She appears well-developed and well-nourished.  HENT:  Head: Normocephalic and atraumatic.  Eyes: Conjunctivae and EOM are normal. Pupils are equal, round, and reactive to light.  Neck: Normal range of motion. Neck supple.  Cardiovascular: Normal rate, regular rhythm and normal heart sounds.   Respiratory: Effort normal and breath sounds normal.  GI: Soft. She exhibits no distension and no mass. There is tenderness. There is guarding. There is no rebound.  Musculoskeletal: Normal range of motion.  Neurological: She is alert and oriented to person, place, and time. She has normal reflexes.  Skin: Skin is warm and dry.  Psychiatric: She has a normal mood and affect. Her behavior is normal. Thought content normal.   Assessment/Plan: 1) Acute colitis: will prep for a colonoscopy  tomorrow-tissue diagnosis is key at this point. Continue Ciprofloxacin and Flagyl for now.  2) GERD: On Pantoprazole. Stop the use of all NSAIDS including TORADOL.  3) Morbid obesity.  4) Migraine headaches.  5) Allergic rhinitis.   Myliah Medel 02/07/2013, 7:54 PM

## 2013-02-08 ENCOUNTER — Encounter (HOSPITAL_COMMUNITY)
Admission: EM | Disposition: A | Discharge status: Home / Self Care | Payer: Self-pay | Source: Home / Self Care | Attending: Emergency Medicine

## 2013-02-08 ENCOUNTER — Encounter (HOSPITAL_COMMUNITY): Payer: Self-pay | Admitting: *Deleted

## 2013-02-08 HISTORY — PX: COLONOSCOPY: SHX5424

## 2013-02-08 LAB — CBC
Hemoglobin: 12.7 g/dL (ref 12.0–15.0)
MCH: 27.8 pg (ref 26.0–34.0)
MCHC: 34.3 g/dL (ref 30.0–36.0)
Platelets: 323 10*3/uL (ref 150–400)
RBC: 4.57 MIL/uL (ref 3.87–5.11)

## 2013-02-08 LAB — BASIC METABOLIC PANEL
Calcium: 8.7 mg/dL (ref 8.4–10.5)
GFR calc Af Amer: >90 mL/min (ref >90)
GFR calc non Af Amer: 86 mL/min — ABNORMAL LOW (ref >90)
Glucose, Bld: 92 mg/dL (ref 70–99)
Potassium: 3.8 mEq/L (ref 3.5–5.1)
Sodium: 137 mEq/L (ref 135–145)

## 2013-02-08 SURGERY — COLONOSCOPY
Anesthesia: Moderate Sedation

## 2013-02-08 MED ORDER — SODIUM CHLORIDE 0.9 % IV SOLN: INTRAVENOUS | Status: DC

## 2013-02-08 MED ORDER — FENTANYL CITRATE 0.05 MG/ML IJ SOLN
INTRAMUSCULAR | Status: DC | PRN
  Administered 2013-02-08 (×4): 25 ug via INTRAVENOUS

## 2013-02-08 MED ORDER — MIDAZOLAM HCL 5 MG/5ML IJ SOLN
INTRAMUSCULAR | Status: DC | PRN
  Administered 2013-02-08 (×3): 2 mg via INTRAVENOUS
  Administered 2013-02-08: 1 mg via INTRAVENOUS

## 2013-02-08 NOTE — Interval H&P Note (Signed)
History and Physical Interval Note:  02/08/2013 4:11 PM  Carla Berry  has presented today for surgery, with the diagnosis of abdominal pain, abnormal CT scan  The various methods of treatment have been discussed with the patient and family. After consideration of risks, benefits and other options for treatment, the patient has consented to  Procedure(s): COLONOSCOPY (N/A) as a surgical intervention .  The patient's history has been reviewed, patient examined, no change in status, stable for surgery.  I have reviewed the patient's chart and labs.  Questions were answered to the patient's satisfaction.     Newel Oien D

## 2013-02-08 NOTE — Op Note (Signed)
[facility] [address] [place], [address]   OPERATIVE PROCEDURE REPORT  PATIENT: Carla Berry, Carla Berry  MR#: [id number] BIRTHDATE: May 10, 1971  GENDER: Female ENDOSCOPIST: Jeani Hawking, MD ASSISTANT:   Kandice Robinsons, technician Jimmey Ralph, RN, CGRN PROCEDURE DATE: 02/08/2013 PROCEDURE:   Colonoscopy with biopsy ASA CLASS:   Class III INDICATIONS:Abnormal CT scan. MEDICATIONS: Versed 10 mg IV and Fentanyl 100 mcg IV  DESCRIPTION OF PROCEDURE:   After the risks benefits and alternatives of the procedure were thoroughly explained, informed consent was obtained.  A digital rectal exam revealed no abnormalities of the rectum.    The Pentax Adult Colon [id number] endoscope was introduced through the anus  and advanced to the terminal ileum which was intubated for a short distance , No adverse events experienced.    The quality of the prep was good. . The instrument was then slowly withdrawn as the colon was fully examined.     FINDINGS: In the ascending colon and a portion of the transverse colon mild erythema was identified.  There was no evidence of any masses.  Cold biopsies were obtained from these areas. Retroflexed views revealed internal/external hemorrhoids.     The scope was then withdrawn from the patient and the procedure terminated.  COMPLICATIONS: There were no complications.  IMPRESSION: 1) Mild erythema in the ascending colon and transverse colon.  RECOMMENDATIONS: 1) Continue with antibiotics for now. 2) Await biopsy results.   _______________________________ eSignedJeani Hawking, MD 02/08/2013 4:49 PM

## 2013-02-08 NOTE — Progress Notes (Signed)
Family Medicine Teaching Service Daily Progress Note Service Pager: [phone]  Subjective: Continues with abdominal pain. Prepped for colonoscopy last night and didn't like it.  Objective: Vital signs in last 24 hours: Filed Vitals:   02/07/13 0445 02/07/13 1358 02/07/13 2048 02/08/13 0506  BP: 123/77 118/90 123/83 153/91  Pulse: 53 56 64 75  Temp: 97.8 F (36.6 C) 97.8 F (36.6 C) 98.4 F (36.9 C) 98.3 F (36.8 C)  TempSrc:  Oral Oral Oral  Resp: 19 20 18 18   Height:      Weight:      SpO2: 97% 100% 100% 99%   Weight change:   Intake/Output Summary (Last 24 hours) at 02/08/13 0713 Last data filed at 02/08/13 0537  Gross per 24 hour  Intake   1884 ml  Output      4 ml  Net   1880 ml   Exam:  General: NAD, resting comfortably HEENT: NCAT, MMM, sclera clear  Cardiovascular: rrr, no mrg  Respiratory: CTAB, no wheezes  Abdomen: soft, obese, +TTP in left mid-abdomen, no rebound  Extremities: no edema  Skin: no lesions noted  Neuro: alert, no focal abnormalities  Lab Results:  Recent Labs  02/07/13 0432 02/08/13 0515  WBC 5.4 7.4  HGB 11.7* 12.7  HCT 35.4* 37.0  PLT 279 323    Recent Labs  02/07/13 0432 02/08/13 0515  NA 138 137  K 3.6 3.8  CL 107 105  CO2 22 25  GLUCOSE 95 92  BUN 5* 5*  CREATININE 0.73 0.84  CALCIUM 8.3* 8.7   Micro Results: No results found for this or any previous visit (from the past 240 hour(s)). Studies/Results:  Medications: I have reviewed the patient's current medications. Scheduled Meds: . ciprofloxacin  400 mg Intravenous Q12H  . heparin  5,000 Units Subcutaneous Q8H  . loratadine  10 mg Oral Daily  . metronidazole  500 mg Intravenous Q8H  . pantoprazole  80 mg Oral Q1200   Continuous Infusions: . sodium chloride 125 mL/hr at 02/08/13 0128   PRN Meds:.acetaminophen, acetaminophen, HYDROcodone-acetaminophen, ondansetron (ZOFRAN) IV, ondansetron  Assessment/Plan: Carla Berry is a 42 y.o. year old female with  a history of GERD, obesity, and gastritis presenting with abdominal pain and loose stools, CT Scan concerning for colitis:   1. Abd pain: potentially related to infectious colitis given CT findings and recent loose stools/diarrhea, though with minimal improvement in symptoms on antibiotics must consider other causes of colitis, specifically IDB.  -clear liquid diet  -NS 125 mL/hr  -IV cipro and flagyl for treatment of infectious cause-likely can transition to PO when clinically improved.  -pain management: tylenol -zofran prn nausea -GI c/s-appreciate their help in this case-for colo today -am BMET and CBC   2. GERD: on nexium at home  -will place on protonix in hospital   3. Allergies: on zyrtec at home  -claritin in hospital   4. Hypokalemia: resolved -am BMET   5. Anemia: resolved -FOBT normal -CBC in AM  FEN/GI: clear liquid diet, NS 125 mL/hr Prophylaxis: SQ heparin  Disposition:  pending improvement in PO intake and abdominal pain   [name] 02/08/2013, 7:13 AM

## 2013-02-08 NOTE — Progress Notes (Signed)
FMTS Attending Admission Note: Carla Reichl,MD I  have seen and examined this patient, reviewed their chart. I have discussed this patient with the resident. I agree with the resident's findings, assessment and care plan.  Patient continues to complain of stomach pain even though most test done has been negative,currently awaiting colonoscopy and biopsy. Stool culture pending,if all test are normal not sure what more to offer,might benefit from PPI and close GI follow up.

## 2013-02-09 ENCOUNTER — Encounter (HOSPITAL_COMMUNITY): Payer: Self-pay | Admitting: Gastroenterology

## 2013-02-09 DIAGNOSIS — K5289 Other specified noninfective gastroenteritis and colitis: Principal | ICD-10-CM

## 2013-02-09 DIAGNOSIS — E669 Obesity, unspecified: Secondary | ICD-10-CM

## 2013-02-09 DIAGNOSIS — J309 Allergic rhinitis, unspecified: Secondary | ICD-10-CM

## 2013-02-09 DIAGNOSIS — K219 Gastro-esophageal reflux disease without esophagitis: Secondary | ICD-10-CM

## 2013-02-09 DIAGNOSIS — D649 Anemia, unspecified: Secondary | ICD-10-CM

## 2013-02-09 LAB — CBC
MCH: 27.2 pg (ref 26.0–34.0)
MCHC: 33.5 g/dL (ref 30.0–36.0)
MCV: 81 fL (ref 78.0–100.0)
Platelets: 292 10*3/uL (ref 150–400)

## 2013-02-09 LAB — BASIC METABOLIC PANEL
BUN: 7 mg/dL (ref 6–23)
CO2: 25 mEq/L (ref 19–32)
Calcium: 7.9 mg/dL — ABNORMAL LOW (ref 8.4–10.5)
GFR calc non Af Amer: 86 mL/min — ABNORMAL LOW (ref >90)
Glucose, Bld: 105 mg/dL — ABNORMAL HIGH (ref 70–99)

## 2013-02-09 MED ORDER — CIPROFLOXACIN HCL 500 MG PO TABS: 500.0000 mg | ORAL_TABLET | Freq: Two times a day (BID) | ORAL | Status: DC

## 2013-02-09 MED ORDER — METRONIDAZOLE 500 MG PO TABS
500.0000 mg | ORAL_TABLET | Freq: Three times a day (TID) | ORAL | Status: DC
  Administered 2013-02-09: 500 mg via ORAL
  Filled 2013-02-09: qty 1

## 2013-02-09 MED ORDER — METRONIDAZOLE 500 MG PO TABS: 500.0000 mg | ORAL_TABLET | Freq: Three times a day (TID) | ORAL | Status: DC

## 2013-02-09 MED ORDER — ONDANSETRON HCL 4 MG PO TABS: 4.0000 mg | ORAL_TABLET | Freq: Four times a day (QID) | ORAL | Status: DC | PRN

## 2013-02-09 MED ORDER — CIPROFLOXACIN HCL 500 MG PO TABS
500.0000 mg | ORAL_TABLET | Freq: Two times a day (BID) | ORAL | Status: DC
  Administered 2013-02-09: 500 mg via ORAL
  Filled 2013-02-09: qty 1

## 2013-02-09 NOTE — Progress Notes (Signed)
Discharge instructions reviewed with patient. Questions answered re: med administration times, return to work date, and follow-up appointment. Patient also aware of new prescription meds available at pharmacy. Verbalizes understanding. Printed AVS given to patient. Patient discharged to home via wheelchair. Accompanied by husband.

## 2013-02-09 NOTE — Discharge Summary (Signed)
Physician Discharge Summary  Patient ID: Carla Berry MRN: [id number] DOB: [birth year] Age: 42 y.o.  Admit date: 02/05/2013 Discharge date: 02/09/2013 Admitting Physician: Barbaraann Barthel, MD  PCP: Etta Grandchild, MD  Consultants: GI, Dr. Loreta Ave     Discharge Diagnosis: Colitis  GERD Allergies Hypokalemia Anemia   Hospital Course Carla Berry is a 42 y.o. year old female with a history of GERD, obesity, and gastritis presenting with abdominal pain and loose stools, CT Scan concerning for colitis.  1. Abd pain: patient presented with several day history of worsening abdominal pain, nausea, and vomiting. Ct abd/pelvis in the ED revealed colitis and patient was admitted for treatment of presumptive infectious colitis due to presence of fever prior to admission. She had a normal WBC. She was started on IV cipro and flagyl. She had minimal improvement in her symptoms on antibiotics, so GI was consulted. Colonoscopy was done and revealed erythema of the ascending and transverse colon. Her diet was advanced slowly to regular and was tolerated at discharge. Attempted to control pain with tylenol as patient has allergy to codeine precluding treatment with narcotics and has history of gastritis that precluded treatment with NSAIDs. She was transitioned to PO cipro/flagyl prior to discharge.  2. GERD: on nexium at home. Placed on protonix in hospital.  3. Allergies: on zyrtec at home. Claritin in hospital.  4. Hypokalemia: 3.4 on admission. Repleted and was normal at discharge.   5. Anemia: likely related to dilutional causes given increased hydration. Patient had negative FOBT.11.6.  Problem List 1. Colitis 2. GERD 3. Allergies 4. Hypokalemia 5. Anemia   Procedures/Imaging:  Ct Abdomen Pelvis W Contrast  02/05/2013  *RADIOLOGY REPORT*  Clinical Data: Abdominal pain  CT ABDOMEN AND PELVIS WITH CONTRAST  Technique:  Multidetector CT imaging of the abdomen and pelvis was performed  following the standard protocol during bolus administration of intravenous contrast.  Contrast: OMNIPAQUE IOHEXOL 300 MG/ML  SOLN  Comparison: 12/26/2012 radiographs, 05/10/2009 CT  Findings: Lung bases are predominately clear.  Normal heart size.  Unremarkable liver, biliary system, spleen, pancreas, adrenal glands.  Lobular renal contours with symmetric enhancement.  No hydronephrosis or hydroureter.  Circumferential thickening of the cecum/proximal ascending colon. Normal appendix.  Reactive size ileocolic lymph nodes.  Small bowel loops are normal course and caliber.  No free intraperitoneal air or fluid.  No lymphadenopathy. Reactive size ileocolic nodes.  Small fat containing umbilical hernia.  Normal caliber aorta and branch vessels.  Partially decompressed bladder.  Unremarkable CT appearance to the uterus and adnexa.  Bilateral tubal ligation clips.  No acute osseous finding.  IMPRESSION: Mild cecal and proximal ascending colonic wall thickening may reflect a nonspecific colitis.  If symptoms persist, recommend colonoscopic follow-up.   Original Report Authenticated By: Jearld Lesch, M.D.     Labs  CBC  Recent Labs Lab 02/07/13 0432 02/08/13 0515 02/09/13 0540  WBC 5.4 7.4 6.6  HGB 11.7* 12.7 11.6*  HCT 35.4* 37.0 34.6*  PLT 279 323 292   BMET  Recent Labs Lab 02/05/13 1924  02/07/13 0432 02/08/13 0515 02/09/13 0540  NA 136  < > 138 137 137  K 3.4*  < > 3.6 3.8 3.8  CL 100  < > 107 105 106  CO2 26  < > 22 25 25   BUN 8  < > 5* 5* 7  CREATININE 0.80  < > 0.73 0.84 0.84  CALCIUM 9.1  < > 8.3* 8.7 7.9*  PROT 7.2  --   --   --   --  BILITOT 0.3  --   --   --   --   ALKPHOS 107  --   --   --   --   ALT 13  --   --   --   --   AST 18  --   --   --   --   GLUCOSE 87  < > 95 92 105*  < > = values in this interval not displayed. Results for orders placed during the hospital encounter of 02/05/13 (from the past 72 hour(s))  BASIC METABOLIC PANEL     Status: Abnormal    Collection Time    02/07/13  4:32 AM      Result Value Range   Sodium 138  135 - 145 mEq/L   Potassium 3.6  3.5 - 5.1 mEq/L   Chloride 107  96 - 112 mEq/L   CO2 22  19 - 32 mEq/L   Glucose, Bld 95  70 - 99 mg/dL   BUN 5 (*) 6 - 23 mg/dL   Creatinine, Ser 9.14  0.50 - 1.10 mg/dL   Calcium 8.3 (*) 8.4 - 10.5 mg/dL   GFR calc non Af Amer >90  >90 mL/min   GFR calc Af Amer >90  >90 mL/min   Comment:            The eGFR has been calculated     using the CKD EPI equation.     This calculation has not been     validated in all clinical     situations.     eGFR's persistently     <90 mL/min signify     possible Chronic Kidney Disease.  CBC     Status: Abnormal   Collection Time    02/07/13  4:32 AM      Result Value Range   WBC 5.4  4.0 - 10.5 K/uL   RBC 4.27  3.87 - 5.11 MIL/uL   Hemoglobin 11.7 (*) 12.0 - 15.0 g/dL   HCT 78.2 (*) 95.6 - 21.3 %   MCV 82.9  78.0 - 100.0 fL   MCH 27.4  26.0 - 34.0 pg   MCHC 33.1  30.0 - 36.0 g/dL   RDW 08.6  57.8 - 46.9 %   Platelets 279  150 - 400 K/uL  STOOL CULTURE     Status: None   Collection Time    02/08/13 12:38 AM      Result Value Range   Specimen Description STOOL     Special Requests NONE     Culture Culture reincubated for better growth     Report Status PENDING    CLOSTRIDIUM DIFFICILE BY PCR     Status: None   Collection Time    02/08/13 12:39 AM      Result Value Range   C difficile by pcr NEGATIVE  NEGATIVE  OCCULT BLOOD X 1 CARD TO LAB, STOOL     Status: None   Collection Time    02/08/13 12:39 AM      Result Value Range   Fecal Occult Bld NEGATIVE  NEGATIVE  BASIC METABOLIC PANEL     Status: Abnormal   Collection Time    02/08/13  5:15 AM      Result Value Range   Sodium 137  135 - 145 mEq/L   Potassium 3.8  3.5 - 5.1 mEq/L   Chloride 105  96 - 112 mEq/L   CO2 25  19 - 32 mEq/L  Glucose, Bld 92  70 - 99 mg/dL   BUN 5 (*) 6 - 23 mg/dL   Creatinine, Ser 1.61  0.50 - 1.10 mg/dL   Calcium 8.7  8.4 - 09.6 mg/dL    GFR calc non Af Amer 86 (*) >90 mL/min   GFR calc Af Amer >90  >90 mL/min   Comment:            The eGFR has been calculated     using the CKD EPI equation.     This calculation has not been     validated in all clinical     situations.     eGFR's persistently     <90 mL/min signify     possible Chronic Kidney Disease.  CBC     Status: None   Collection Time    02/08/13  5:15 AM      Result Value Range   WBC 7.4  4.0 - 10.5 K/uL   RBC 4.57  3.87 - 5.11 MIL/uL   Hemoglobin 12.7  12.0 - 15.0 g/dL   HCT 04.5  40.9 - 81.1 %   MCV 81.0  78.0 - 100.0 fL   MCH 27.8  26.0 - 34.0 pg   MCHC 34.3  30.0 - 36.0 g/dL   RDW 91.4  78.2 - 95.6 %   Platelets 323  150 - 400 K/uL  BASIC METABOLIC PANEL     Status: Abnormal   Collection Time    02/09/13  5:40 AM      Result Value Range   Sodium 137  135 - 145 mEq/L   Potassium 3.8  3.5 - 5.1 mEq/L   Chloride 106  96 - 112 mEq/L   CO2 25  19 - 32 mEq/L   Glucose, Bld 105 (*) 70 - 99 mg/dL   BUN 7  6 - 23 mg/dL   Creatinine, Ser 2.13  0.50 - 1.10 mg/dL   Calcium 7.9 (*) 8.4 - 10.5 mg/dL   GFR calc non Af Amer 86 (*) >90 mL/min   GFR calc Af Amer >90  >90 mL/min   Comment:            The eGFR has been calculated     using the CKD EPI equation.     This calculation has not been     validated in all clinical     situations.     eGFR's persistently     <90 mL/min signify     possible Chronic Kidney Disease.  CBC     Status: Abnormal   Collection Time    02/09/13  5:40 AM      Result Value Range   WBC 6.6  4.0 - 10.5 K/uL   RBC 4.27  3.87 - 5.11 MIL/uL   Hemoglobin 11.6 (*) 12.0 - 15.0 g/dL   HCT 08.6 (*) 57.8 - 46.9 %   MCV 81.0  78.0 - 100.0 fL   MCH 27.2  26.0 - 34.0 pg   MCHC 33.5  30.0 - 36.0 g/dL   RDW 62.9  52.8 - 41.3 %   Platelets 292  150 - 400 K/uL       Patient condition at time of discharge/disposition: stable  Disposition-home   Follow up issues: 1. F/u resolution of abdominal pain and colonoscopy biopsy  results 2. Patient with Hgb 11.6 on discharge, consider repeat to ensure this is stable  Discharge follow up:  Follow-up Information   Follow up with MERRELL, DAVID, MD  On 02/22/2013. (8:45 am)    Contact information:   [address] [place] [address] [phone]       Follow up with Charna Elizabeth, MD. (Please call to schedule an appointment with Dr. Loreta Ave in one week.)    Contact information:   [address] [place] [address] [phone]       Future Appointments Provider Department Dept Phone   02/22/2013 8:45 AM Ozella Rocks, MD [facility] [phone]       Discharge Instructions: Please refer to Patient Instructions section of EMR for full details.  Patient was counseled important signs and symptoms that should prompt return to medical care, changes in medications, dietary instructions, activity restrictions, and follow up appointments.    Discharge Medications   Medication List    STOP taking these medications       loperamide 2 MG tablet  Commonly known as:  IMODIUM A-D      TAKE these medications       acetaminophen 500 MG tablet  Commonly known as:  TYLENOL  Take 500-1,000 mg by mouth every 6 (six) hours as needed for pain or fever.     cetirizine 10 MG tablet  Commonly known as:  ZYRTEC  Take 10 mg by mouth daily.     ciprofloxacin 500 MG tablet  Commonly known as:  CIPRO  Take 1 tablet (500 mg total) by mouth 2 (two) times daily.     esomeprazole 40 MG capsule  Commonly known as:  NEXIUM  Take 1 capsule (40 mg total) by mouth daily. one hour before largest meal of the day     metroNIDAZOLE 500 MG tablet  Commonly known as:  FLAGYL  Take 1 tablet (500 mg total) by mouth every 8 (eight) hours.     ondansetron 4 MG tablet  Commonly known as:  ZOFRAN  Take 1 tablet (4 mg total) by mouth every 6 (six) hours as needed for nausea.     PROBIOTIC DAILY PO  Take 1 capsule by mouth daily as needed.        [name], MD of [facility] 02/10/2013 1:22 PM

## 2013-02-09 NOTE — Progress Notes (Signed)
FMTS Attending Admission Note: Carla Parlow,MD I  have seen and examined this patient, reviewed their chart. I have discussed this patient with the resident. I agree with the resident's findings, assessment and care plan.  Patient looks better today although she still has some mild abdominal pain from the procedure last night,her diet was advanced which she feels she is tolerating well. As discussed with her if GI does not feel she need to be her for colon biopsy report then she can go home with GI follow up,she is agreeable to this. We will discuss discharge plan with her again.

## 2013-02-09 NOTE — Progress Notes (Signed)
Family Medicine Teaching Service Daily Progress Note Service Pager: [phone]  Subjective: Abdominal pain slightly better. Ate a little following the colonoscopy yesterday.  Objective: Vital signs in last 24 hours: Filed Vitals:   02/08/13 1710 02/08/13 1732 02/08/13 2056 02/09/13 0547  BP: 145/83 138/83 119/75 103/63  Pulse:   75 54  Temp:  98.1 F (36.7 C) 98.1 F (36.7 C) 98.4 F (36.9 C)  TempSrc:   Oral   Resp: 23 18 18    Height:      Weight:      SpO2: 97% 100% 100% 97%   Weight change:  No intake or output data in the 24 hours ending 02/09/13 0919 Exam:  General: NAD, resting comfortably  Cardiovascular: rrr, no mrg  Respiratory: CTAB, no wheezes  Abdomen: soft, obese, +TTP in left mid-abdomen, no rebound or guarding Extremities: no edema  Skin: no lesions noted  Neuro: alert, no focal abnormalities  Lab Results:  Recent Labs  02/08/13 0515 02/09/13 0540  WBC 7.4 6.6  HGB 12.7 11.6*  HCT 37.0 34.6*  PLT 323 292    Recent Labs  02/08/13 0515 02/09/13 0540  NA 137 137  K 3.8 3.8  CL 105 106  CO2 25 25  GLUCOSE 92 105*  BUN 5* 7  CREATININE 0.84 0.84  CALCIUM 8.7 7.9*   Micro Results: Recent Results (from the past 240 hour(s))  STOOL CULTURE     Status: None   Collection Time    02/08/13 12:38 AM      Result Value Range Status   Specimen Description STOOL   Final   Special Requests NONE   Final   Culture Culture reincubated for better growth   Final   Report Status PENDING   Incomplete  CLOSTRIDIUM DIFFICILE BY PCR     Status: None   Collection Time    02/08/13 12:39 AM      Result Value Range Status   C difficile by pcr NEGATIVE  NEGATIVE Final   Studies/Results:  Medications: I have reviewed the patient's current medications. Scheduled Meds: . ciprofloxacin  400 mg Intravenous Q12H  . heparin  5,000 Units Subcutaneous Q8H  . loratadine  10 mg Oral Daily  . metronidazole  500 mg Intravenous Q8H  . pantoprazole  80 mg Oral Q1200    Continuous Infusions: . sodium chloride 125 mL/hr at 02/09/13 0113   PRN Meds:.acetaminophen, acetaminophen, HYDROcodone-acetaminophen, ondansetron (ZOFRAN) IV, ondansetron  Assessment/Plan: Carla Berry is a 42 y.o. year old female with a history of GERD, obesity, and gastritis presenting with abdominal pain and loose stools, CT Scan concerning for colitis:   1. Abd pain: potentially related to infectious colitis given CT findings and recent loose stools/diarrhea, though with minimal improvement in symptoms on antibiotics must consider other causes of colitis, specifically IDB.  -regular diet  -NS 125 mL/hr  -IV cipro and flagyl for treatment of infectious cause-likely can transition to PO when clinically improved.  -pain management: tylenol -zofran prn nausea -GI c/s-appreciate their help in this case-colonoscopy with erythema, biopsies taken-continue antibiotics-will touch base with Dr. Loreta Ave today to see if patient needs to stay in the hospital while we await biopsy results -am BMET and CBC   2. GERD: on nexium at home  -will place on protonix in hospital   3. Allergies: on zyrtec at home  -claritin in hospital   4. Hypokalemia: resolved -am BMET   5. Anemia: 11.6 this am, possibly related to dilution -FOBT normal -CBC in  AM  FEN/GI: regular diet, NS 125 mL/hr Prophylaxis: SQ heparin  Disposition:  pending improvement in PO intake and abdominal pain   Carla Berry 02/09/2013, 9:19 AM

## 2013-02-10 NOTE — Discharge Summary (Signed)
FMTS Attending Admission Note: Kajuan Guyton,MD I  have seen and examined this patient, reviewed their chart. I have discussed this patient with the resident. I agree with the resident's findings, assessment and care plan.  

## 2013-02-11 LAB — STOOL CULTURE

## 2013-02-22 ENCOUNTER — Encounter: Payer: Self-pay | Admitting: Family Medicine

## 2013-02-22 ENCOUNTER — Ambulatory Visit (INDEPENDENT_AMBULATORY_CARE_PROVIDER_SITE_OTHER): Payer: BC Managed Care – PPO | Admitting: Family Medicine

## 2013-02-22 ENCOUNTER — Inpatient Hospital Stay: Payer: BC Managed Care – PPO | Admitting: Family Medicine

## 2013-02-22 VITALS — BP 154/94 | HR 101 | Temp 99.0°F | Ht 63.0 in | Wt 248.0 lb

## 2013-02-22 DIAGNOSIS — K2900 Acute gastritis without bleeding: Secondary | ICD-10-CM

## 2013-02-22 NOTE — Patient Instructions (Addendum)
Thank you for coming in todaty You are doing great. You had a very serious infection of your colon that will take some time to heal.  I am reasurred by your exam today Continue to drink lots of fluids and your probiotics Please call the clinic 24/7 if you have any questions or concerns.  Please try your zofran at night to help with your nausea. I do not want to stop the "girgling" that you are hearing as this will slow down the healing process.  Call Dr. Loreta Ave prior to going to [place]  Colitis Colitis is inflammation of the colon. Colitis can be a short-term or long-standing (chronic) illness. Crohn's disease and ulcerative colitis are 2 types of colitis which are chronic. They usually require lifelong treatment. CAUSES  There are many different causes of colitis, including:  Viruses.  Germs (bacteria).  Medicine reactions. SYMPTOMS   Diarrhea.  Intestinal bleeding.  Pain.  Fever.  Throwing up (vomiting).  Tiredness (fatigue).  Weight loss.  Bowel blockage. DIAGNOSIS  The diagnosis of colitis is based on examination and stool or blood tests. X-rays, CT scan, and colonoscopy may also be needed. TREATMENT  Treatment may include:  Fluids given through the vein (intravenously).  Bowel rest (nothing to eat or drink for a period of time).  Medicine for pain and diarrhea.  Medicines (antibiotics) that kill germs.  Cortisone medicines.  Surgery. HOME CARE INSTRUCTIONS   Get plenty of rest.  Drink enough water and fluids to keep your urine clear or pale yellow.  Eat a well-balanced diet.  Call your caregiver for follow-up as recommended. SEEK IMMEDIATE MEDICAL CARE IF:   You develop chills.  You have an oral temperature above 102 F (38.9 C), not controlled by medicine.  You have extreme weakness, fainting, or dehydration.  You have repeated vomiting.  You develop severe belly (abdominal) pain or are passing bloody or tarry stools. MAKE SURE YOU:    Understand these instructions.  Will watch your condition.  Will get help right away if you are not doing well or get worse. Document Released: 10/30/2004 Document Revised: 12/15/2011 Document Reviewed: 01/25/2010 [facility] Patient Information 2013 [place], [place].

## 2013-02-22 NOTE — Assessment & Plan Note (Signed)
Off cipro and flagyl Much improved Continue probiotics per Dr. Loreta Ave Continue nexium PRN adn Zofran PRN Precautions given

## 2013-02-22 NOTE — Progress Notes (Signed)
Carla Berry is a 42 y.o. female who presents to [facility] today for hospital followup  Infectious colitis: left hospital on 5/7. Finished the Cipro/Flagyl on Saturday. Saw Dr. Loreta Ave on 5/13 who recommended probiotic for 1 month. Denies fevers, diarrhea, emesis. Endorses abdominal pain that has been persistent since hospital admission. Pain not increased. Feels pressure all the time, described as full feeling. Very active bowel sounds described by pt. Abd pain w/ BM, 1-2 BM per day.   The following portions of the patient's history were reviewed and updated as appropriate: allergies, current medications, past medical history, family and social history, and problem list.  Patient is a nonsmoker.  Past Medical History  Diagnosis Date  . Gastric polyp 07/23/12    Pathology: Chronic inactive gastritis.  EGD by Dr Arty Baumgartner (GI)    ROS as above otherwise neg.    Medications reviewed. Current Outpatient Prescriptions  Medication Sig Dispense Refill  . acetaminophen (TYLENOL) 500 MG tablet Take 500-1,000 mg by mouth every 6 (six) hours as needed for pain or fever.      . cetirizine (ZYRTEC) 10 MG tablet Take 10 mg by mouth daily.      . ciprofloxacin (CIPRO) 500 MG tablet Take 1 tablet (500 mg total) by mouth 2 (two) times daily.  20 tablet  0  . esomeprazole (NEXIUM) 40 MG capsule Take 1 capsule (40 mg total) by mouth daily. one hour before largest meal of the day  90 capsule  3  . metroNIDAZOLE (FLAGYL) 500 MG tablet Take 1 tablet (500 mg total) by mouth every 8 (eight) hours.  30 tablet  0  . ondansetron (ZOFRAN) 4 MG tablet Take 1 tablet (4 mg total) by mouth every 6 (six) hours as needed for nausea.  20 tablet  0  . Probiotic Product (PROBIOTIC DAILY PO) Take 1 capsule by mouth daily as needed.       No current facility-administered medications for this visit.    Exam: BP 154/94  Pulse 101  Temp(Src) 99 F (37.2 C) (Oral)  Ht 5\' 3"  (1.6 m)  Wt 248 lb (112.492 kg)  BMI 43.94 kg/m2  LMP  01/15/2013 Gen: Well NAD HEENT: EOMI,  MMM GI: NABS, non-tender to palpation  No results found for this or any previous visit (from the past 72 hour(s)).

## 2013-04-14 ENCOUNTER — Ambulatory Visit (INDEPENDENT_AMBULATORY_CARE_PROVIDER_SITE_OTHER): Payer: BC Managed Care – PPO | Admitting: Family Medicine

## 2013-04-14 ENCOUNTER — Encounter: Payer: Self-pay | Admitting: Family Medicine

## 2013-04-14 VITALS — BP 144/82 | HR 86 | Temp 98.6°F | Ht 63.0 in | Wt 249.0 lb

## 2013-04-14 DIAGNOSIS — L559 Sunburn, unspecified: Secondary | ICD-10-CM
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
## 2013-04-28 ENCOUNTER — Encounter: Payer: Self-pay | Admitting: Family Medicine

## 2013-04-28 ENCOUNTER — Ambulatory Visit (INDEPENDENT_AMBULATORY_CARE_PROVIDER_SITE_OTHER): Payer: BC Managed Care – PPO | Admitting: Family Medicine

## 2013-04-28 VITALS — BP 131/81 | HR 75 | Temp 97.8°F | Wt 249.0 lb

## 2013-04-28 DIAGNOSIS — Z23 Encounter for immunization: Secondary | ICD-10-CM

## 2013-04-28 DIAGNOSIS — L559 Sunburn, unspecified: Secondary | ICD-10-CM

## 2013-04-28 NOTE — Assessment & Plan Note (Signed)
Healing well. No apparent contractures. Patient to f/u as needed. Advised to return if tightness develops in the area of the burn.

## 2013-04-28 NOTE — Patient Instructions (Addendum)
Nice to see you again. Your hand continues to look better. Please continue to watch this. If it every develops any tight feeling in the skin please come back to be seen. We will update your tetanus shot today as well.
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
## 2013-06-30 IMAGING — CT CT ABD-PELV W/ CM
2 of 4 series · 14 of 32 positions shown, 19 images · IV contrast (water/omni  & 100ml omni 300)
Comparison: 12/26/2012 radiographs, 05/10/2009 CT

CLINICAL DATA: Abdominal pain

CT ABDOMEN AND PELVIS WITH CONTRAST
TECHNIQUE: Multidetector CT imaging of the abdomen and pelvis was
performed following the standard protocol during bolus
administration of intravenous contrast.
Contrast: 100mL OMNIPAQUE IOHEXOL 300 MG/ML  SOLN

[Series 2: routine abdomen · axial · 0.98mm/px · z∈[-458,-74]mm · 8 of 100 slices shown, 13 images]
[im 12/100  soft-tissue]
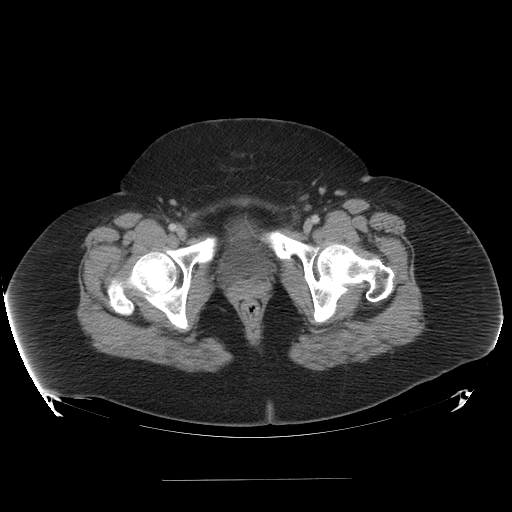
[im 12/100  bone]
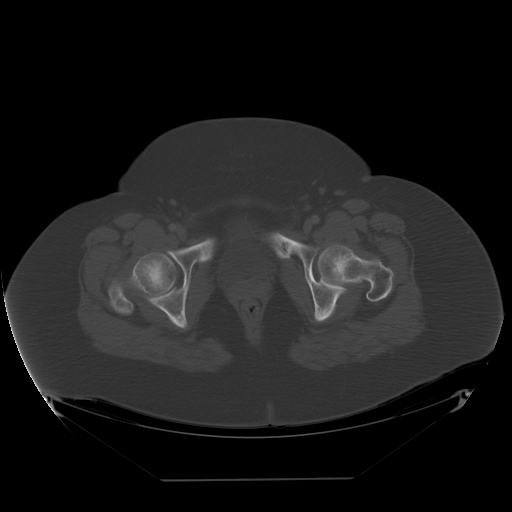
[im 23/100  soft-tissue]
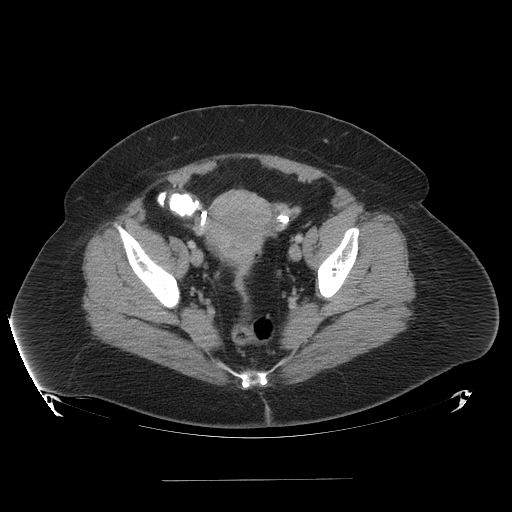
[im 34/100  soft-tissue]
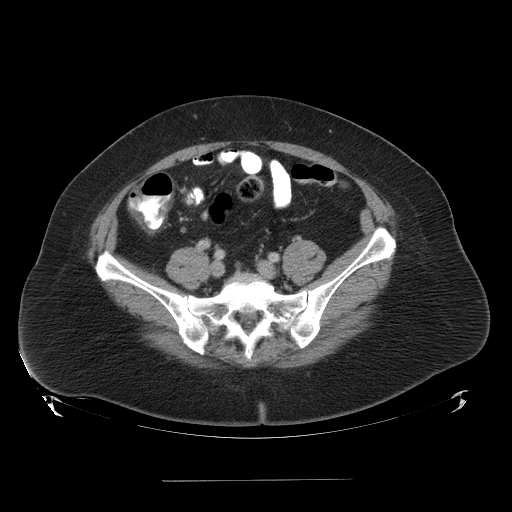
[im 45/100  soft-tissue]
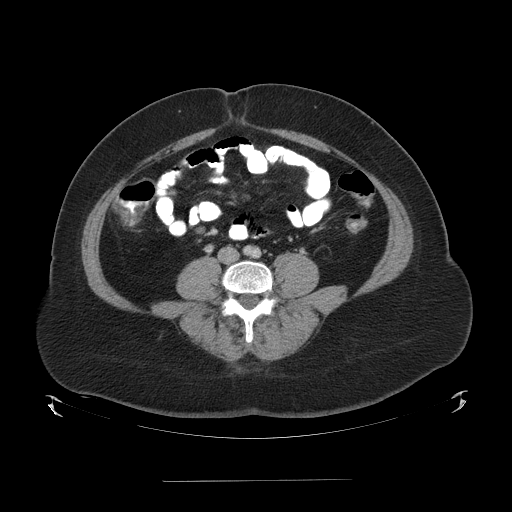
[im 56/100  soft-tissue]
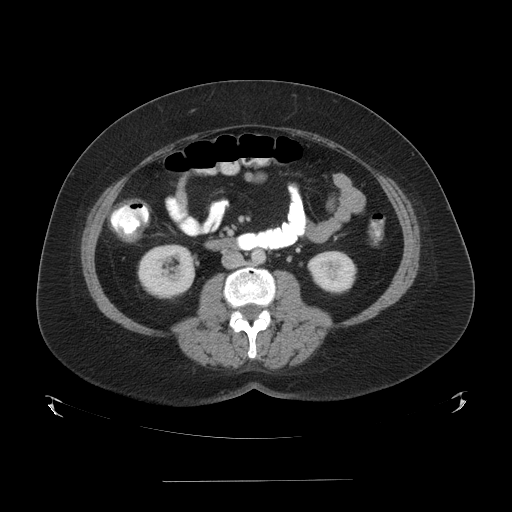
[im 56/100  lung]
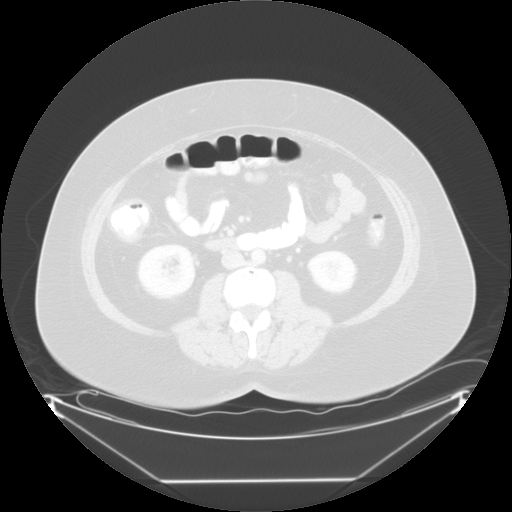
[im 67/100  soft-tissue]
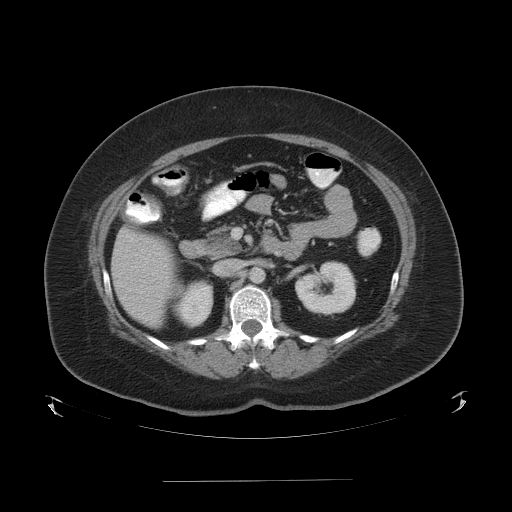
[im 67/100  lung]
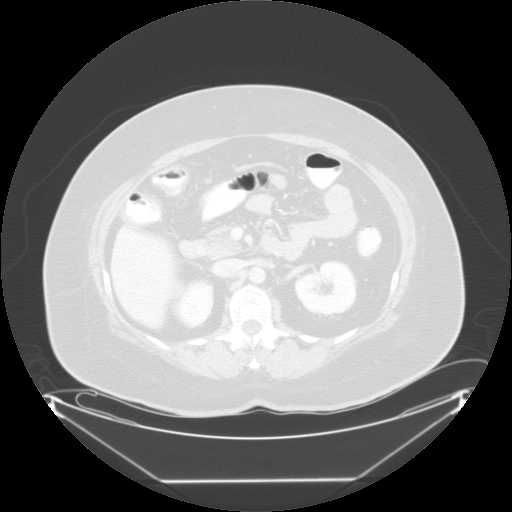
[im 78/100  soft-tissue]
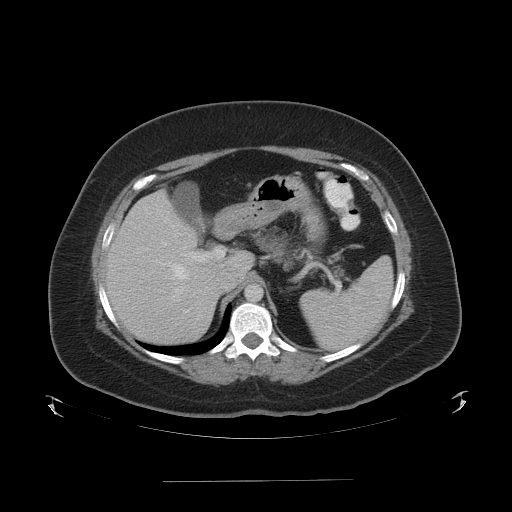
[im 78/100  lung]
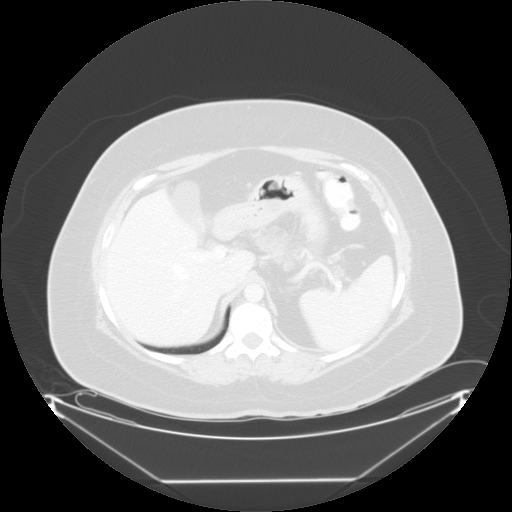
[im 89/100  soft-tissue]
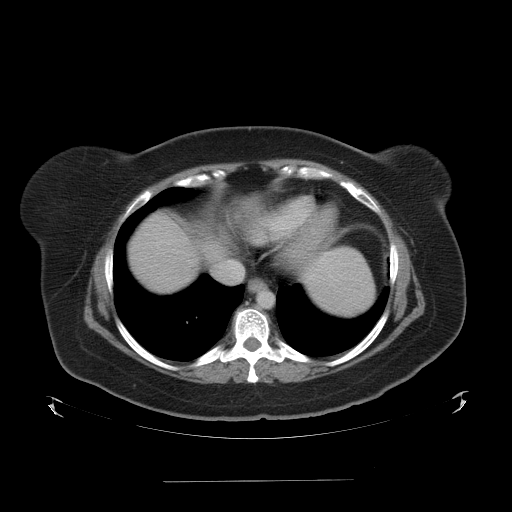
[im 89/100  lung]
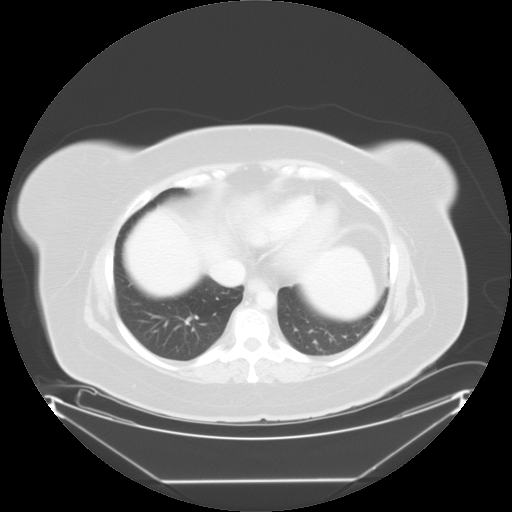

[Series 401: sagittals · sagittal · 0.98mm/px · 6 of 124 slices shown]
[im 13/124  soft-tissue]
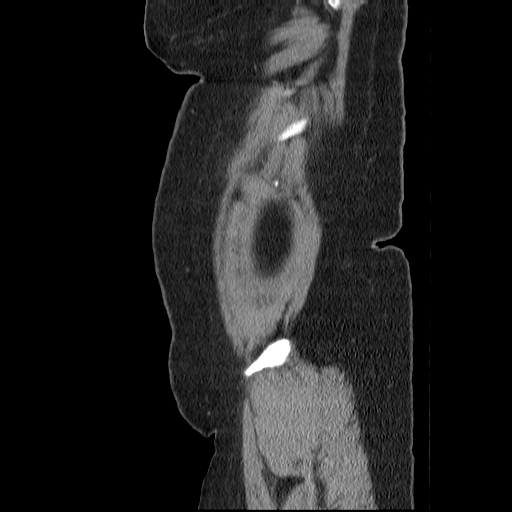
[im 25/124  soft-tissue]
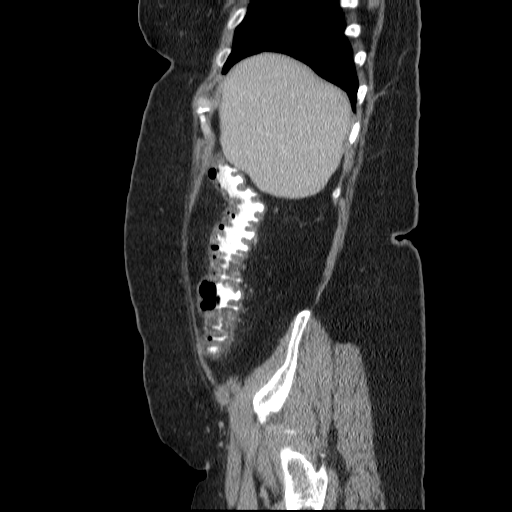
[im 37/124  soft-tissue]
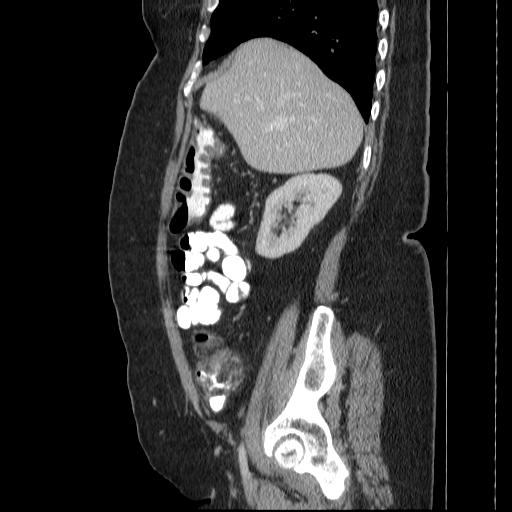
[im 50/124  soft-tissue]
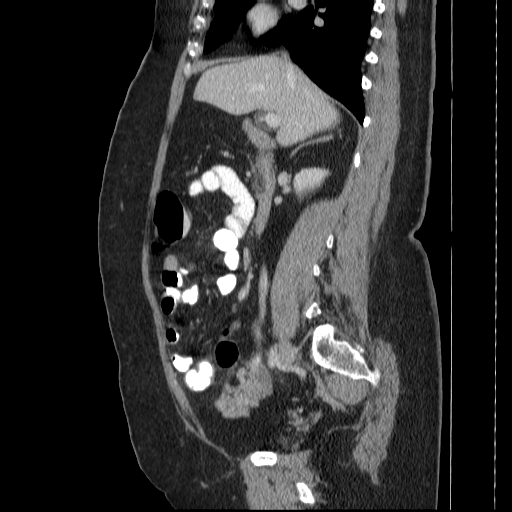
[im 74/124  soft-tissue]
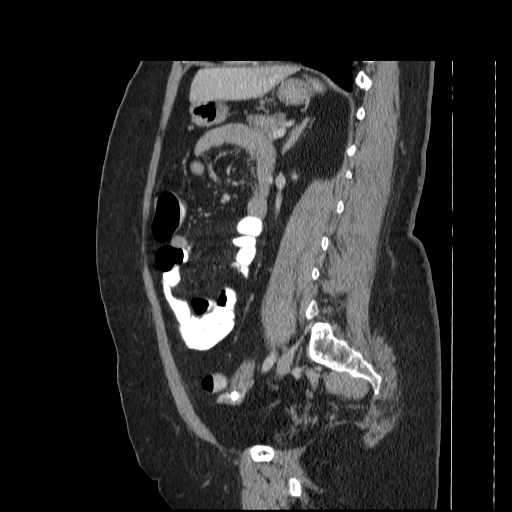
[im 87/124  soft-tissue]
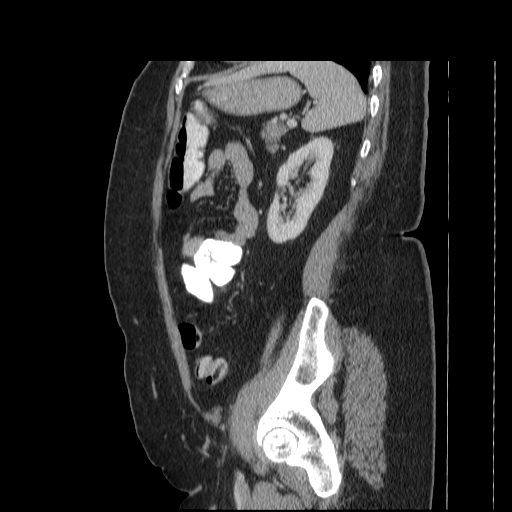

[14 of 32 positions shown; findings below may reference images not displayed]

FINDINGS: Lung bases are predominately clear.  Normal heart size.

Unremarkable liver, biliary system, spleen, pancreas, adrenal
glands.

Lobular renal contours with symmetric enhancement.  No
hydronephrosis or hydroureter.

Circumferential thickening of the cecum/proximal ascending colon.
Normal appendix.  Reactive size ileocolic lymph nodes.  Small bowel
loops are normal course and caliber.

No free intraperitoneal air or fluid.  No lymphadenopathy. Reactive
size ileocolic nodes.  Small fat containing umbilical hernia.

Normal caliber aorta and branch vessels.

Partially decompressed bladder.  Unremarkable CT appearance to the
uterus and adnexa.  Bilateral tubal ligation clips.

No acute osseous finding.
IMPRESSION: Mild cecal and proximal ascending colonic wall thickening may
reflect a nonspecific colitis.  If symptoms persist, recommend
colonoscopic follow-up.

## 2013-07-11 ENCOUNTER — Ambulatory Visit (INDEPENDENT_AMBULATORY_CARE_PROVIDER_SITE_OTHER): Payer: BC Managed Care – PPO | Admitting: *Deleted

## 2013-07-11 DIAGNOSIS — Z23 Encounter for immunization: Secondary | ICD-10-CM

## 2013-08-24 ENCOUNTER — Other Ambulatory Visit: Payer: Self-pay | Admitting: Family Medicine

## 2013-08-26 ENCOUNTER — Encounter: Payer: Self-pay | Admitting: Family Medicine

## 2013-08-26 ENCOUNTER — Ambulatory Visit (INDEPENDENT_AMBULATORY_CARE_PROVIDER_SITE_OTHER): Payer: BC Managed Care – PPO | Admitting: Family Medicine

## 2013-08-26 VITALS — BP 142/95 | HR 93 | Temp 99.0°F | Ht 63.0 in | Wt 252.0 lb

## 2013-08-26 DIAGNOSIS — J069 Acute upper respiratory infection, unspecified: Secondary | ICD-10-CM

## 2013-08-26 MED ORDER — BENZONATATE 100 MG PO CAPS: 100.0000 mg | ORAL_CAPSULE | Freq: Two times a day (BID) | ORAL | Status: DC | PRN

## 2013-08-26 NOTE — Assessment & Plan Note (Signed)
You have a viral URI with cough. For this please do the following: 1. Drink plenty of fluids. Hot tea, soup etc will help open your nasal passages and soothe your throat. 2. Tessalon perles for cough suppression 3. Mucinex/robitussin (guaifenessin) to break up secretions. 4. Tylenol for pain not to exceed 3000 mg in one day  F/u for chest pain, shortness of breath, persistent high fever.

## 2013-08-26 NOTE — Patient Instructions (Signed)
Ms. Smouse,  Thank you for coming in today.   You have a viral URI with cough. For this please do the following: 1. Drink plenty of fluids. Hot tea, soup etc will help open your nasal passages and soothe your throat. 2. Tessalon perles for cough suppression 3. Mucinex/robitussin (guaifenessin) to break up secretions. 4. Tylenol for pain not to exceed 3000 mg in one day  F/u for chest pain, shortness of breath, persistent high fever.

## 2013-08-26 NOTE — Progress Notes (Signed)
  Subjective:    Patient ID: Carla Berry, female    DOB: 04-26-1971, 42 y.o.   MRN: [id number]  HPI 42 yo F presents for same day visit to discuss the following:  1. Cough with sore throat: x 5 days. No fever. Feels warm and chilly at night. 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 Taking tylenol cold and flu. Lost her voice yesterday.   Soc hx: non smoker, quit 07/07/2003 Review of Systems As per HPI     Objective:   Physical Exam BP 142/95  Pulse 93  Temp(Src) 99 F (37.2 C) (Oral)  Ht 5\' 3"  (1.6 m)  Wt 252 lb (114.306 kg)  BMI 44.65 kg/m2 General appearance: alert, appears stated age, no distress and mildly obese Head: Normocephalic, without obvious abnormality, atraumatic Eyes: conjunctivae/corneas clear. PERRL, EOM's intact.  Ears: normal TM and external ear canal right ear and abnormal external canal left ear - ceruminosis impacting canal Nose: Nares normal. Septum midline. Mucosa normal. No drainage or sinus tenderness. Throat: lips, mucosa, and tongue normal; teeth and gums normal dentures upper and lower Neck: mild anterior cervical adenopathy, no carotid bruit, no JVD and supple, symmetrical, trachea midline Lungs: clear to auscultation bilaterally Heart: regular rate and rhythm, S1, S2 normal, no murmur, click, rub or gallop    Assessment & Plan:

## 2013-10-22 ENCOUNTER — Other Ambulatory Visit: Payer: Self-pay | Admitting: Family Medicine

## 2014-01-24 ENCOUNTER — Other Ambulatory Visit: Payer: Self-pay | Admitting: Family Medicine

## 2014-04-26 ENCOUNTER — Other Ambulatory Visit: Payer: Self-pay | Admitting: Family Medicine

## 2014-04-27 ENCOUNTER — Ambulatory Visit (INDEPENDENT_AMBULATORY_CARE_PROVIDER_SITE_OTHER): Payer: BC Managed Care – PPO | Admitting: Family Medicine

## 2014-04-27 ENCOUNTER — Encounter: Payer: Self-pay | Admitting: Family Medicine

## 2014-04-27 VITALS — BP 133/98 | HR 105 | Temp 98.1°F | Ht 63.0 in | Wt 252.5 lb

## 2014-04-27 DIAGNOSIS — T300 Burn of unspecified body region, unspecified degree: Secondary | ICD-10-CM

## 2014-04-27 DIAGNOSIS — IMO0002 Reserved for concepts with insufficient information to code with codable children: Secondary | ICD-10-CM
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
## 2014-04-27 NOTE — Patient Instructions (Signed)
Continue using the bacitracin ointment twice a day. Use the aloe vera to keep the area moisturized.  If the area starts to get more red and increase in size, that would be concerning for infection. If this happens please call the clinic and come in to be seen again.

## 2014-07-13 ENCOUNTER — Ambulatory Visit (INDEPENDENT_AMBULATORY_CARE_PROVIDER_SITE_OTHER): Payer: BC Managed Care – PPO | Admitting: *Deleted

## 2014-07-13 DIAGNOSIS — Z23 Encounter for immunization: Secondary | ICD-10-CM | POA: Diagnosis not present

## 2014-08-18 ENCOUNTER — Ambulatory Visit (INDEPENDENT_AMBULATORY_CARE_PROVIDER_SITE_OTHER): Payer: BC Managed Care – PPO | Admitting: Family Medicine

## 2014-08-18 ENCOUNTER — Encounter: Payer: Self-pay | Admitting: Family Medicine

## 2014-08-18 VITALS — BP 138/101 | HR 81 | Temp 98.3°F | Wt 249.0 lb

## 2014-08-18 DIAGNOSIS — N200 Calculus of kidney: Secondary | ICD-10-CM

## 2014-08-18 DIAGNOSIS — G44209 Tension-type headache, unspecified, not intractable: Secondary | ICD-10-CM

## 2014-08-18 DIAGNOSIS — R109 Unspecified abdominal pain: Secondary | ICD-10-CM | POA: Diagnosis not present

## 2014-08-18 DIAGNOSIS — R51 Headache: Secondary | ICD-10-CM

## 2014-08-18 DIAGNOSIS — R519 Headache, unspecified: Secondary | ICD-10-CM | POA: Insufficient documentation

## 2014-08-18 LAB — POCT URINALYSIS DIPSTICK
Bilirubin, UA: NEGATIVE
GLUCOSE UA: NEGATIVE
Ketones, UA: NEGATIVE
Leukocytes, UA: NEGATIVE
Nitrite, UA: NEGATIVE
Protein, UA: NEGATIVE
SPEC GRAV UA: 1.025
UROBILINOGEN UA: 0.2
pH, UA: 5.5

## 2014-08-18 LAB — POCT UA - MICROSCOPIC ONLY

## 2014-08-18 MED ORDER — TAMSULOSIN HCL 0.4 MG PO CAPS: 0.4000 mg | ORAL_CAPSULE | Freq: Every day | ORAL | Status: DC

## 2014-08-18 MED ORDER — CYCLOBENZAPRINE HCL 10 MG PO TABS
10.0000 mg | ORAL_TABLET | Freq: Three times a day (TID) | ORAL | Status: DC | PRN
Start: 2014-08-18 — End: 2014-11-16

## 2014-08-18 NOTE — Patient Instructions (Signed)
Dear Carla Berry, Thank you for coming in to clinic today.  1. I think that you may have a Kidney Stone - this will need to pass on its own. 2. Prescribed Flexeril - for muscle relaxant, take half tablet 5mg  up to 3 times daily as needed (try at night, may make you sleepy), if tolerated can take whole tablet up to 3 times daily 3. Prescribed Flomax - take 1 tablet daily, to help you pass stone - as discussed, evidence shows this is not very effective 4. Take regular Tylenol 500mg  (extra str) x 2 tablets every 6 hours (no more than 6 daily) 5. Moist heat and massage for back and neck will help  Please schedule a follow-up appointment with Dr. McDiarmid in 2 weeks if symptoms persist or worsen. If emergency with fevers/chills, n/v worsening severe pain, please go to ED.  If you have any other questions or concerns, please feel free to call the clinic to contact me. You may also schedule an earlier appointment if necessary.  However, if your symptoms get significantly worse, please go to the Emergency Department to seek immediate medical attention.  Nobie Putnam, DO [facility]   Dietary Guidelines to Help Prevent Kidney Stones Your risk of kidney stones can be decreased by adjusting the foods you eat. The most important thing you can do is drink enough fluid. You should drink enough fluid to keep your urine clear or pale yellow. The following guidelines provide specific information for the type of kidney stone you have had. GUIDELINES ACCORDING TO TYPE OF KIDNEY STONE Calcium Oxalate Kidney Stones  Reduce the amount of salt you eat. Foods that have a lot of salt cause your body to release excess calcium into your urine. The excess calcium can combine with a substance called oxalate to form kidney stones.  Reduce the amount of animal protein you eat if the amount you eat is excessive. Animal protein causes your body to release excess calcium into your urine. Ask your  dietitian how much protein from animal sources you should be eating.  Avoid foods that are high in oxalates. If you take vitamins, they should have less than 500 mg of vitamin C. Your body turns vitamin C into oxalates. You do not need to avoid fruits and vegetables high in vitamin C. Calcium Phosphate Kidney Stones  Reduce the amount of salt you eat to help prevent the release of excess calcium into your urine.  Reduce the amount of animal protein you eat if the amount you eat is excessive. Animal protein causes your body to release excess calcium into your urine. Ask your dietitian how much protein from animal sources you should be eating.  Get enough calcium from food or take a calcium supplement (ask your dietitian for recommendations). Food sources of calcium that do not increase your risk of kidney stones include:  Broccoli.  Dairy products, such as cheese and yogurt.  Pudding. Uric Acid Kidney Stones  Do not have more than 6 oz of animal protein per day. FOOD SOURCES Animal Protein Sources  Meat (all types).  Poultry.  Eggs.  Fish, seafood. Foods High in [employer] seasonings.  Soy sauce.  Teriyaki sauce.  Cured and processed meats.  Salted crackers and snack foods.  Fast food.  Canned soups and most canned foods. Foods High in Oxalates  Grains:  Amaranth.  Barley.  Grits.  Wheat germ.  Bran.  Buckwheat flour.  All bran cereals.  Pretzels.  Whole wheat  bread.  Vegetables:  Beans (wax).  Beets and beet greens.  Collard greens.  Eggplant.  [place]scarole.  Leeks.  Okra.  Parsley.  Rutabagas.  Spinach.  Swiss chard.  Tomato paste.  Fried potatoes.  Sweet potatoes.  Fruits:  Red currants.  Figs.  Kiwi.  Rhubarb.  Meat and Other Protein Sources:  Beans (dried).  Soy burgers and other soybean products.  Miso.  Nuts (peanuts, almonds, pecans, cashews, hazelnuts).  Nut butters.  Sesame seeds and tahini  (paste made of sesame seeds).  Poppy seeds.  Beverages:  Chocolate drink mixes.  Soy milk.  Instant iced tea.  Juices made from high-oxalate fruits or vegetables.  Other:  Carob.  Chocolate.  Fruitcake.  Marmalades. Document Released: 01/17/2011 Document Revised: 09/27/2013 Document Reviewed: 08/19/2013 [facility] Patient Information 2015 [place], [place]. This information is not intended to replace advice given to you by your health care provider. Make sure you discuss any questions you have with your health care provider.

## 2014-08-18 NOTE — Progress Notes (Signed)
   Subjective:    Patient ID: Carla Berry, female    DOB: 1971-02-08, 43 y.o.   MRN: [id number]  Patient presents for a same day appointment.  HPI  FLANK PAIN, Right: - Reported hx of multiple recurrent kidney stones and infections (23 years ago, while pregnant), s/p surgery Right kidney stent, previously followed by Dr. Amalia Hailey (Urology), last seen Urology Dr. Wendy Poet (2007), was told that Right kidney remains functional but does have some "scarring", has intermittent episodes of pain over the years, last episode 3-4 months. Currently admits to some Right lower flank pain about 1 week ago, overall persistent with pain some improvement, dull ache with episodes of sharp pains non-radiating lasting hours, some brief periods of pain free, worse with laying on R side, twisting to Right. Not improved with rest or positions. Not associated to eating. - Tried Icy-Hot without relief. Some Tylenol without significant relief - Admits HAs - Denies fevers/chills, dysuria, hematuria, urinary frequency, abdominal pain, n/v  Headache: - Reports recent HA, gradual worsening after onset of Right Flank pain, described as bilateral frontal HAs, worse at end of day, improved with Tylenol and rest - Denies any vision changes, neurological changes, weakness  I have reviewed and updated the following as appropriate: allergies and current medications  Social Hx: - Former smoker  Review of Systems  See above HPI    Objective:   Physical Exam  BP 138/101 mmHg  Pulse 81  Temp(Src) 98.3 F (36.8 C) (Oral)  Wt 249 lb (112.946 kg)  LMP 08/11/2014 (Approximate)  Gen - obese, well-appearing, NAD HEENT - NCAT, MMM Neck - supple with some bilateral upper C-spine paraspinal hypertrophy and mildly tender to palpation Heart - RRR, no murmurs heard Lungs - CTAB. Normal work of breathing. Abd - soft, some mild tenderness to palpation LUQ, negative Murphy's, negative RUQ McBurney's, no masses, +active BS MSK -  Right lower flank moderately tender to palpation without significant CVAT Ext - non-tender, no edema, peripheral pulses intact +2 b/l Skin - warm, dry Neuro - awake, alert, oriented    Assessment & Plan:   See specific A&P problem list for details.

## 2014-08-18 NOTE — Assessment & Plan Note (Addendum)
Suspected nephrolithiasis with suggestive UA (+hgb, RBCs microscopic hematuria), with gradual worsening non-radiating Right flank pain without any urinary or infectious symptoms x 1 week, PMH complex Right Kidney issues (multiple surgeries, stents, d/t stones and infection >20 years ago). Likely component of muscle strain. Additionally, with some LUQ abdominal pain however not consistent with gallbladder or biliary colic. - vitals stable, afebrile, no tachycardia. Good UOP  Plan: 1. UA / microscopy - neg nitrite, neg leuks, mod Hgb, RBC 5-10, WBC rare, many Calcium Oxalate crystals 2. OTC analgesia (declined short course opiates), rx Flexeril PRN 3. Trial on Flomax 0.4mg  daily (1-2 weeks) to assist passing stone, however discussed evidence that shows this is not effective, but patient interested in trial 4. Anticipate to pass stone, but if symptoms persist or worsen, advised to return within 1-2 weeks, consider helical non-contrast CT imaging (eval for hydro and ureters) or if severe with +fevers, n/v, intractable pain may go to ED

## 2014-08-18 NOTE — Assessment & Plan Note (Signed)
Consistent with tension headache, seems exacerbated by current right flank pain  Plan: 1. Performed OMT with Suboccipital Release, patient consented, agreed, and tolerated well with some improvement 2. Recommend increase Tylenol to extra str 2 tabs up to TID, moist heat 3. RTC if worsening

## 2014-11-02 ENCOUNTER — Encounter: Payer: BC Managed Care – PPO | Admitting: Family Medicine

## 2014-11-12 ENCOUNTER — Other Ambulatory Visit: Payer: Self-pay | Admitting: Family Medicine

## 2014-11-16 ENCOUNTER — Other Ambulatory Visit: Payer: Self-pay

## 2014-11-16 ENCOUNTER — Encounter: Payer: Self-pay | Admitting: Family Medicine

## 2014-11-16 ENCOUNTER — Ambulatory Visit (INDEPENDENT_AMBULATORY_CARE_PROVIDER_SITE_OTHER): Payer: BLUE CROSS/BLUE SHIELD | Admitting: Family Medicine

## 2014-11-16 VITALS — BP 132/86 | HR 68 | Temp 98.0°F | Ht 63.0 in | Wt 249.0 lb

## 2014-11-16 DIAGNOSIS — R7303 Prediabetes: Secondary | ICD-10-CM

## 2014-11-16 DIAGNOSIS — N901 Moderate vulvar dysplasia: Secondary | ICD-10-CM

## 2014-11-16 DIAGNOSIS — Z87442 Personal history of urinary calculi: Secondary | ICD-10-CM

## 2014-11-16 DIAGNOSIS — L989 Disorder of the skin and subcutaneous tissue, unspecified: Secondary | ICD-10-CM

## 2014-11-16 DIAGNOSIS — E8881 Metabolic syndrome: Secondary | ICD-10-CM | POA: Diagnosis not present

## 2014-11-16 DIAGNOSIS — Z1231 Encounter for screening mammogram for malignant neoplasm of breast: Secondary | ICD-10-CM

## 2014-11-16 DIAGNOSIS — K219 Gastro-esophageal reflux disease without esophagitis: Secondary | ICD-10-CM

## 2014-11-16 DIAGNOSIS — R7309 Other abnormal glucose: Secondary | ICD-10-CM

## 2014-11-16 LAB — BASIC METABOLIC PANEL
BUN: 10 mg/dL (ref 6–23)
CO2: 27 mEq/L (ref 19–32)
CREATININE: 0.77 mg/dL (ref 0.50–1.10)
Calcium: 9 mg/dL (ref 8.4–10.5)
Chloride: 103 mEq/L (ref 96–112)
Glucose, Bld: 87 mg/dL (ref 70–99)
POTASSIUM: 4.4 meq/L (ref 3.5–5.3)
SODIUM: 139 meq/L (ref 135–145)

## 2014-11-16 LAB — LIPID PANEL
CHOL/HDL RATIO: 3 ratio
Cholesterol: 145 mg/dL (ref 0–200)
HDL: 48 mg/dL (ref >39)
LDL CALC: 76 mg/dL (ref 0–99)
TRIGLYCERIDES: 105 mg/dL (ref <150)
VLDL: 21 mg/dL (ref 0–40)

## 2014-11-16 LAB — POCT GLYCOSYLATED HEMOGLOBIN (HGB A1C): Hemoglobin A1C: 5.6

## 2014-11-17 ENCOUNTER — Encounter: Payer: Self-pay | Admitting: Family Medicine

## 2014-11-17 DIAGNOSIS — L989 Disorder of the skin and subcutaneous tissue, unspecified: Secondary | ICD-10-CM

## 2014-11-17 DIAGNOSIS — E8881 Metabolic syndrome: Secondary | ICD-10-CM | POA: Insufficient documentation

## 2014-11-17 HISTORY — DX: Disorder of the skin and subcutaneous tissue, unspecified: L98.9

## 2014-11-17 NOTE — Assessment & Plan Note (Signed)
New complaint Lesion present for at least one year without significant change in size or symptoms. Need to rule out Basal Cell carcinoma RTC one month for biopsy

## 2014-11-17 NOTE — Assessment & Plan Note (Signed)
Wt Readings from Last 3 Encounters:  11/16/14 249 lb (112.946 kg)  08/18/14 249 lb (112.946 kg)  04/27/14 252 lb 8 oz (114.533 kg)   Has begun low carbohydrate diet Has begun exercise program

## 2014-11-17 NOTE — Assessment & Plan Note (Signed)
Stable. Continue Nexium full dose daily Need to check Vit B12 level, +/- Mg level next visit.

## 2014-11-17 NOTE — Progress Notes (Signed)
   Subjective:    Patient ID: Carla Berry, female    DOB: 01-26-1971, 44 y.o.   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  Patient was to follow up in one year.  Pt did not follow up.  - Pap (05/2012): satisfactory, absent transformation zone, neg for IE lesion or malignancy.  Pap 12/06/09: satisfactory, present transformation zone, neg for IE lesion or malignancy.  - No palpable plaque in perineum/vulva, no vaginal itching, no blood on underwear, no dysuria    Prediabetes  - Diagnosis since 2012 - Not on antidiabetic medication - No polyuria, polydipsia, no unintended weight loss - No chest pain, no change vision, no claudication - Family has begun low carbohydrate diet as her husband and youngest daughter are at-risk for diabetes - Beginning an exercise program   History of nephrolithiasis - Longstanding problem of recurrent stones since adolescent - Last episode in November 2015 that resolved spontaneously - Denies flank pain, hematuria - Not on special diet. - Variable fluid intake daily.      Low   GASTROESOPHAGEAL REFLUX, NO ESOPHAGITIS - Longstanding issue - Followed by Dr Collene Mares (GI).  On daily Nexium 40 mg. - If stops Nexium, indigestion flares soon after. - Denies Melena, epigastric pain, dysphagia, odynophagia, cough      Former smoker  PMH/PSH/Med list/Problem list updated  Review of Systems See     Objective:   Physical Exam VS reviewed GEN: Alert, Cooperative, Groomed, NAD HEENT: PERRL; EAC bilaterally not occluded, TM's translucent with normal LM, (+) LR; No cervical LAN, No thyromegaly, No palpable masses COR: RRR, No M/G/R, No JVD, Normal PMI size and location LUNGS: BCTA, No Acc mm use, speaking in full sentences ABDOMEN: (+)BS, soft, NT, ND, No HSM, No palpable  masses EXT: No peripheral leg edema. Feet without deformity or lesions. Palpable bilateral pedal pulses.  Gait: Normal speed, No significant path deviation, Step through +,  Psych: Normal affect/thought/speech/language    Assessment & Plan:

## 2014-11-17 NOTE — Progress Notes (Signed)
Patient ID: Carla Berry, female   DOB: 01-11-1971, 45 y.o.   MRN: [id number] Skin lesion Location: anterior left chest just inferior proximal clavicle Duration: > 1 year Progression: no change Symptom: occasional itching No prior diagnostic work up of therapy  Physical Left anterior chest annular raised lesion with rolled skin-colored border, 2-3 mm surrounding blushing skin.  No telangectasia.

## 2014-11-17 NOTE — Assessment & Plan Note (Signed)
No concerning symptoms / signs. Carla Berry did not follow up yet for surveillance colposcopy for history of VIN-II Plan Referral to [facility] for surveillance Colposcopy and screening Pap.

## 2014-11-17 NOTE — Assessment & Plan Note (Signed)
Stable Discussed risk of progression to DMT2 and Cardiovascular disease of Prediabetes. See obesity problem

## 2014-11-20 ENCOUNTER — Encounter: Payer: Self-pay | Admitting: Family Medicine

## 2014-11-23 ENCOUNTER — Ambulatory Visit: Payer: BC Managed Care – PPO

## 2014-11-23 ENCOUNTER — Ambulatory Visit
Admission: RE | Admit: 2014-11-23 | Discharge: 2014-11-23 | Disposition: A | Discharge status: Home / Self Care | Payer: BLUE CROSS/BLUE SHIELD | Source: Ambulatory Visit

## 2014-11-23 DIAGNOSIS — Z1231 Encounter for screening mammogram for malignant neoplasm of breast: Secondary | ICD-10-CM

## 2014-11-30 ENCOUNTER — Other Ambulatory Visit (HOSPITAL_COMMUNITY)
Admission: RE | Admit: 2014-11-30 | Discharge: 2014-11-30 | Disposition: A | Discharge status: Home / Self Care | Payer: BLUE CROSS/BLUE SHIELD | Source: Ambulatory Visit | Attending: Family Medicine | Admitting: Family Medicine

## 2014-11-30 ENCOUNTER — Ambulatory Visit (INDEPENDENT_AMBULATORY_CARE_PROVIDER_SITE_OTHER): Payer: BLUE CROSS/BLUE SHIELD | Admitting: Family Medicine

## 2014-11-30 VITALS — BP 131/91 | HR 77 | Temp 98.2°F | Ht 63.0 in | Wt 250.9 lb

## 2014-11-30 DIAGNOSIS — R102 Pelvic and perineal pain: Secondary | ICD-10-CM | POA: Insufficient documentation

## 2014-11-30 DIAGNOSIS — Z01419 Encounter for gynecological examination (general) (routine) without abnormal findings: Secondary | ICD-10-CM | POA: Diagnosis not present

## 2014-11-30 DIAGNOSIS — Z124 Encounter for screening for malignant neoplasm of cervix: Secondary | ICD-10-CM | POA: Diagnosis not present

## 2014-11-30 DIAGNOSIS — Z1151 Encounter for screening for human papillomavirus (HPV): Secondary | ICD-10-CM | POA: Insufficient documentation

## 2014-11-30 DIAGNOSIS — Z87412 Personal history of vulvar dysplasia: Secondary | ICD-10-CM | POA: Diagnosis not present

## 2014-11-30 DIAGNOSIS — R1030 Lower abdominal pain, unspecified: Secondary | ICD-10-CM | POA: Diagnosis not present

## 2014-11-30 DIAGNOSIS — R238 Other skin changes: Secondary | ICD-10-CM

## 2014-11-30 DIAGNOSIS — L989 Disorder of the skin and subcutaneous tissue, unspecified: Secondary | ICD-10-CM

## 2014-11-30 HISTORY — DX: Pelvic and perineal pain: R10.2

## 2014-11-30 MED ORDER — NYSTATIN 100000 UNIT/GM EX CREA: 1.0000 "application " | TOPICAL_CREAM | Freq: Two times a day (BID) | CUTANEOUS | Status: DC

## 2014-11-30 NOTE — Progress Notes (Signed)
Subjective:     Patient ID: Carla Berry, female   DOB: 1971/09/08, 44 y.o.   MRN: [id number]  HPI  Gyn exam:Here for PAP, denies vaginal discharge or bleeding. Hx of ZCH:YIFOYDX here to follow up for hx of Vulva Intraepithelial Neoplasm which was diagnosed in 2005, she had excisional biopsy done then with clear margin. She denies any resent lesion or irritation of her vulva. Suprapubic pain:She c/o suprapubic pain that has been on going for years, pain usually starts few days prior to her menses and last almost till the end. Pain is about 8/10 in severity, aching and occasionally sharp. She denies any urinary symptoms. She had never been worked up in the past for this pain.She endorsed heavy period but attributed it to her fibroid. Skin irritation:Patient stated years ago she had irritation on the skin in her vulva area and was prescribed nystatin (with combination with other agent but she is uncertain what the agent was), she does have irritation on and off but non in the last few month, she is requesting refill of Nystatin which had helped in the past.Currently she denies any skin irritation.  Current Outpatient Prescriptions on File Prior to Visit  Medication Sig Dispense Refill  . cetirizine (ZYRTEC) 10 MG tablet Take 10 mg by mouth daily.    Marland Kitchen NEXIUM 40 MG capsule TAKE 1 CAPSULE BY MOUTH DAILY 90 capsule 3  . acetaminophen (TYLENOL) 500 MG tablet Take 500-1,000 mg by mouth every 6 (six) hours as needed for pain or fever.    . Probiotic Product (PROBIOTIC DAILY PO) Take 1 capsule by mouth daily as needed.     No current facility-administered medications on file prior to visit.   Past Medical History  Diagnosis Date  . Gastric polyp 07/23/12    Pathology: Chronic inactive gastritis.  EGD by Dr Verdia Kuba (GI)  . Gastritis, acute 07/16/2012    Dr Collene Mares (GI) ordered abdominal US that was normal (07/13/12).  Dr Collene Mares ordered Nuc Med Hepatobiliary Tc99 scan with cholecystikinin stimulation which  was normal on 07/13/12.  Upper Endoscopy by Dr Collene Mares on 07/21/12 showed mild antral gastritis and sessile gastric polyps that were benign on histopathology    . FIBROIDS, UTERUS 02/08/2007    Qualifier: Diagnosis of  By: McDiarmid MD, Sherren Mocha    . MIGRAINE, UNSPEC., W/O INTRACTABLE MIGRAINE 12/03/2006    Qualifier: Diagnosis of  By: Drucie Ip    . History of nephrolithiasis 12/03/2006    Qualifier: History of  By: McDiarmid MD, Sherren Mocha    . PALPITATIONS, RECURRENT 07/31/2008    Qualifier: History of  By: McDiarmid MD, Sherren Mocha    . TOBACCO USE, QUIT 12/11/2009    Qualifier: Diagnosis of  By: Shelbie Proctor    . PAPANICOLAOU SMEAR, ABNORMAL 12/03/2006    Qualifier: History of By: McDiarmid MD, Sherren Mocha        Review of Systems  Respiratory: Negative.   Cardiovascular: Negative.   Gastrointestinal:       Suprapubic pain.  Genitourinary: Positive for menstrual problem and pelvic pain. Negative for vaginal bleeding, vaginal discharge and vaginal pain.       Heavy period  All other systems reviewed and are negative.  Filed Vitals:   11/30/14 0951  BP: 131/91  Pulse: 77  Temp: 98.2 F (36.8 C)  TempSrc: Oral  Height: 5\' 3"  (1.6 m)  Weight: 250 lb 14.4 oz (113.807 kg)       Objective:   Physical Exam  Constitutional:  She appears well-developed. No distress.  Cardiovascular: Normal rate, regular rhythm, normal heart sounds and intact distal pulses.   No murmur heard. Pulmonary/Chest: Effort normal and breath sounds normal. No respiratory distress. She has no wheezes. She exhibits no tenderness.  Abdominal: Soft. Bowel sounds are normal. She exhibits no distension and no mass. There is tenderness in the suprapubic area.  Very mild suprapubic tenderness, no fullness or mass.  Genitourinary: Vagina normal and uterus normal.    No labial fusion. There is no rash, tenderness, lesion or injury on the right labia. There is no rash, tenderness, lesion or injury on the left labia. Cervix  exhibits no motion tenderness, no discharge and no friability. Right adnexum displays no mass and no tenderness. Left adnexum displays no mass and no tenderness.  Nursing note and vitals reviewed.      Assessment:     Gyn exam: Hx of VIN: Suprapubic pain: Skin irritation:    Plan:     Check problem list.

## 2014-11-30 NOTE — Assessment & Plan Note (Signed)
PAP done today. I will call patient with result.

## 2014-11-30 NOTE — Progress Notes (Deleted)
Subjective:     Patient ID: Carla Berry, female   DOB: Oct 23, 1970, 44 y.o.   MRN: [id number]  HPI   Review of Systems     Objective:   Physical Exam     Assessment:     ***    Plan:     ***

## 2014-11-30 NOTE — Assessment & Plan Note (Signed)
Intermittent irritation of vulva area. Currently asymptomatic. Vulva exam today was normal. ?? Yeast infection since she had done well on nystatin in the past. Per patient she had done well with Nystatin I believe combined with steroid but she has hx of allergy to steroid. I prescribed Nystatin ointment for her today, use as needed.

## 2014-11-30 NOTE — Assessment & Plan Note (Signed)
S/P excision in 2005. No aceto-white lesion on exam today. Vulva looks ok. Patient advised to get her vulva examined yearly. F/U with PCP as indicated.

## 2014-11-30 NOTE — Assessment & Plan Note (Signed)
Etiology unclear. Differential includes fibroid, endometriosis vs adenomyosis. I will start assessment with pelvic U/S. Consider endometrial biopsy with hx of menorrhagia. I will discuss further with patient once I obtain pelvic U/S report.

## 2014-11-30 NOTE — Patient Instructions (Signed)
Pap Test A Pap test checks the cells on the surface of your cervix. Your doctor will look for cell changes that are not normal, an infection, or cancer. If the cells no longer look normal, it is called dysplasia. Dysplasia can turn into cancer. Regular Pap tests are important to stop cancer from developing. BEFORE THE PROCEDURE  Ask your doctor when to schedule your Pap test. Timing the test around your period may be important.  Do not douche or have sex (intercourse) for 24 hours before the test.  Do not put creams on your vagina or use tampons for 24 hours before the test.  Go pee (urinate) just before the test. PROCEDURE  You will lie on an exam table with your feet in stirrups.  A warm metal or plastic tool (speculum) will be put in your vagina to open it up.  Your doctor will use a small, plastic brush or wooden spatula to take cells from your cervix.  The cells will be put in a lab container.  The cells will be checked under a microscope to see if they are normal or not. AFTER THE PROCEDURE Get your test results. If they are abnormal, you may need more tests. Document Released: 10/25/2010 Document Revised: 12/15/2011 Document Reviewed: 09/18/2011 [employer] Patient Information 2015 [employer]. This information is not intended to replace advice given to you by your health care provider. Make sure you discuss any questions you have with your health care provider.  

## 2014-12-04 ENCOUNTER — Telehealth: Payer: Self-pay | Admitting: *Deleted

## 2014-12-04 ENCOUNTER — Encounter: Payer: Self-pay | Admitting: *Deleted

## 2014-12-04 LAB — CYTOLOGY - PAP

## 2014-12-04 NOTE — Telephone Encounter (Signed)
-----   Message from Andrena Mews, MD sent at 12/04/2014  1:20 PM EST ----- Please inform patient her PAP result is normal.

## 2014-12-04 NOTE — Telephone Encounter (Signed)
Letter mailed to patient. Jazmin Hartsell,CMA  

## 2014-12-06 ENCOUNTER — Encounter: Payer: Self-pay | Admitting: *Deleted

## 2014-12-06 ENCOUNTER — Telehealth: Payer: Self-pay | Admitting: *Deleted

## 2014-12-06 ENCOUNTER — Ambulatory Visit (HOSPITAL_COMMUNITY)
Admission: RE | Admit: 2014-12-06 | Discharge: 2014-12-06 | Disposition: A | Discharge status: Home / Self Care | Payer: BLUE CROSS/BLUE SHIELD | Source: Ambulatory Visit | Attending: Family Medicine | Admitting: Family Medicine

## 2014-12-06 DIAGNOSIS — D259 Leiomyoma of uterus, unspecified: Secondary | ICD-10-CM | POA: Insufficient documentation

## 2014-12-06 DIAGNOSIS — N888 Other specified noninflammatory disorders of cervix uteri: Secondary | ICD-10-CM | POA: Insufficient documentation

## 2014-12-06 DIAGNOSIS — N949 Unspecified condition associated with female genital organs and menstrual cycle: Secondary | ICD-10-CM | POA: Diagnosis not present

## 2014-12-06 DIAGNOSIS — R1030 Lower abdominal pain, unspecified: Secondary | ICD-10-CM

## 2014-12-06 DIAGNOSIS — R109 Unspecified abdominal pain: Secondary | ICD-10-CM | POA: Diagnosis not present

## 2014-12-06 DIAGNOSIS — G8929 Other chronic pain: Secondary | ICD-10-CM | POA: Insufficient documentation

## 2014-12-06 NOTE — Telephone Encounter (Signed)
-----   Message from Andrena Mews, MD sent at 12/06/2014  9:52 AM EST ----- Pls call patient. U/S showed small fibroid otherwise normal. Have her follow up with PCP if still having symptoms. She might benefit from [facility] referral.

## 2014-12-06 NOTE — Telephone Encounter (Signed)
Tried to reach patient by phone but unable to LM due to VM not set up.  Will mail letter. Jazmin Hartsell,CMA

## 2014-12-14 ENCOUNTER — Ambulatory Visit: Payer: BC Managed Care – PPO | Admitting: Family Medicine

## 2014-12-28 ENCOUNTER — Ambulatory Visit: Payer: BLUE CROSS/BLUE SHIELD | Admitting: Family Medicine

## 2015-01-04 ENCOUNTER — Other Ambulatory Visit: Payer: Self-pay | Admitting: Family Medicine

## 2015-01-04 ENCOUNTER — Ambulatory Visit (INDEPENDENT_AMBULATORY_CARE_PROVIDER_SITE_OTHER): Payer: BLUE CROSS/BLUE SHIELD | Admitting: Family Medicine

## 2015-01-04 ENCOUNTER — Encounter: Payer: Self-pay | Admitting: Family Medicine

## 2015-01-04 VITALS — BP 124/72 | HR 87 | Temp 98.2°F | Ht 63.0 in | Wt 253.1 lb

## 2015-01-04 DIAGNOSIS — L988 Other specified disorders of the skin and subcutaneous tissue: Secondary | ICD-10-CM | POA: Diagnosis not present

## 2015-01-04 NOTE — Patient Instructions (Signed)
Suture Removal, Care After Refer to this sheet in the next few weeks. These instructions provide you with information on caring for yourself after your procedure. Your health care provider may also give you more specific instructions. Your treatment has been planned according to current medical practices, but problems sometimes occur. Call your health care provider if you have any problems or questions after your procedure. WHAT TO EXPECT AFTER THE PROCEDURE After your stitches (sutures) are removed, it is typical to have the following:  Some discomfort and swelling in the wound area.  Slight redness in the area. HOME CARE INSTRUCTIONS      Apply triple antibiotic ointment to surgical site at least once a day, two to three times a day if possible for 7 days. Cover site with bandage.      Remove Suture in 8 days. Place bandage over site where suture was removed.  Keep bandaged for at least 3 more days.   If you have skin adhesive strips over the wound area, do not take the strips off. They will fall off on their own in a few days. If the strips remain in place after 14 days, you may remove them.  Change any bandages (dressings) at least once a day or as directed by your health care provider. If the bandage sticks, soak it off with warm, soapy water.  Apply cream or ointment only as directed by your health care provider. If using cream or ointment, wash the area with soap and water 2 times a day to remove all the cream or ointment. Rinse off the soap and pat the area dry with a clean towel.  Keep the wound area dry and clean. If the bandage becomes wet or dirty, or if it develops a bad smell, change it as soon as possible.  Continue to protect the wound from injury.  Use sunscreen when out in the sun. New scars become sunburned easily. SEEK MEDICAL CARE IF:  You have increasing redness, swelling, or pain in the wound.  You see pus coming from the wound.  You have a fever.  You notice a  bad smell coming from the wound or dressing.  Your wound breaks open (edges not staying together). Document Released: 06/17/2001 Document Revised: 07/13/2013 Document Reviewed: 05/04/2013 [facility] Patient Information 2015 [place], [place]. This information is not intended to replace advice given to you by your health care provider. Make sure you discuss any questions you have with your health care provider.

## 2015-01-05 NOTE — Progress Notes (Signed)
  Punch Biopsy Procedure Note  Pre-operative Diagnosis: Actinic keratosis, Basal cell carcinoma, SCC  Post-operative Diagnosis: same  Locations:anterior chest left precordium  Indications: persistence with slight increase in size over last year  Anesthesia: Lidocaine 1% with epinephrine without added sodium bicarbonate  Procedure Details  History of allergy to iodine: no Patient informed of the risks (including bleeding and infection) and benefits of the  procedure and Written informed consent obtained.  The lesion and surrounding area was given a sterile prep using betadyne. The skin was then stretched perpendicular to the skin tension lines and the lesion removed using the 6 mm punch. The resulting ellipse was then closed. The wound was closed with 1-0 Prolene using simple interrupted stitches. Antibiotic ointment and a sterile dressing applied. The specimen was sent for pathologic examination. The patient tolerated the procedure well.  EBL: 1 ml  Findings:pending  Condition: Stable  Complications: none.  Plan: 1. Instructed to keep the wound dry and covered for 24-48h and clean thereafter. 2. Warning signs of infection were reviewed.   3. Recommended that the patient use acetaminophen (OTC) as needed for pain.  4. Patient to remove suture in 8 days.

## 2015-01-10 ENCOUNTER — Encounter: Payer: Self-pay | Admitting: Family Medicine

## 2015-02-08 ENCOUNTER — Ambulatory Visit (INDEPENDENT_AMBULATORY_CARE_PROVIDER_SITE_OTHER): Payer: BLUE CROSS/BLUE SHIELD | Admitting: Family Medicine

## 2015-02-08 ENCOUNTER — Encounter: Payer: Self-pay | Admitting: Family Medicine

## 2015-02-08 VITALS — BP 136/94 | HR 92 | Temp 99.0°F | Ht 63.0 in | Wt 254.5 lb

## 2015-02-08 DIAGNOSIS — R059 Cough, unspecified: Secondary | ICD-10-CM | POA: Insufficient documentation

## 2015-02-08 DIAGNOSIS — R05 Cough: Secondary | ICD-10-CM | POA: Diagnosis not present

## 2015-02-08 MED ORDER — HYDROCODONE-HOMATROPINE 5-1.5 MG/5ML PO SYRP: 5.0000 mL | ORAL_SOLUTION | ORAL | Status: DC | PRN

## 2015-02-08 MED ORDER — BENZONATATE 200 MG PO CAPS: 200.0000 mg | ORAL_CAPSULE | Freq: Three times a day (TID) | ORAL | Status: DC | PRN

## 2015-02-08 NOTE — Patient Instructions (Signed)
Upper Respiratory Infection, Adult An upper respiratory infection (URI) is also sometimes known as the common cold. The upper respiratory tract includes the nose, sinuses, throat, trachea, and bronchi. Bronchi are the airways leading to the lungs. Most people improve within 1 week, but symptoms can last up to 2 weeks. A residual cough may last even longer.  CAUSES Many different viruses can infect the tissues lining the upper respiratory tract. The tissues become irritated and inflamed and often become very moist. Mucus production is also common. A cold is contagious. You can easily spread the virus to others by oral contact. This includes kissing, sharing a glass, coughing, or sneezing. Touching your mouth or nose and then touching a surface, which is then touched by another person, can also spread the virus. SYMPTOMS  Symptoms typically develop 1 to 3 days after you come in contact with a cold virus. Symptoms vary from person to person. They may include:  Runny nose.  Sneezing.  Nasal congestion.  Sinus irritation.  Sore throat.  Loss of voice (laryngitis).  Cough.  Fatigue.  Muscle aches.  Loss of appetite.  Headache.  Low-grade fever. DIAGNOSIS  You might diagnose your own cold based on familiar symptoms, since most people get a cold 2 to 3 times a year. Your caregiver can confirm this based on your exam. Most importantly, your caregiver can check that your symptoms are not due to another disease such as strep throat, sinusitis, pneumonia, asthma, or epiglottitis. Blood tests, throat tests, and X-rays are not necessary to diagnose a common cold, but they may sometimes be helpful in excluding other more serious diseases. Your caregiver will decide if any further tests are required. RISKS AND COMPLICATIONS  You may be at risk for a more severe case of the common cold if you smoke cigarettes, have chronic heart disease (such as heart failure) or lung disease (such as asthma), or if  you have a weakened immune system. The very young and very old are also at risk for more serious infections. Bacterial sinusitis, middle ear infections, and bacterial pneumonia can complicate the common cold. The common cold can worsen asthma and chronic obstructive pulmonary disease (COPD). Sometimes, these complications can require emergency medical care and may be life-threatening. PREVENTION  The best way to protect against getting a cold is to practice good hygiene. Avoid oral or hand contact with people with cold symptoms. Wash your hands often if contact occurs. There is no clear evidence that vitamin C, vitamin E, echinacea, or exercise reduces the chance of developing a cold. However, it is always recommended to get plenty of rest and practice good nutrition. TREATMENT  Treatment is directed at relieving symptoms. There is no cure. Antibiotics are not effective, because the infection is caused by a virus, not by bacteria. Treatment may include:  Increased fluid intake. Sports drinks offer valuable electrolytes, sugars, and fluids.  Breathing heated mist or steam (vaporizer or shower).  Eating chicken soup or other clear broths, and maintaining good nutrition.  Getting plenty of rest.  Using gargles or lozenges for comfort.  Controlling fevers with ibuprofen or acetaminophen as directed by your caregiver.  Increasing usage of your inhaler if you have asthma. Zinc gel and zinc lozenges, taken in the first 24 hours of the common cold, can shorten the duration and lessen the severity of symptoms. Pain medicines may help with fever, muscle aches, and throat pain. A variety of non-prescription medicines are available to treat congestion and runny nose. Your caregiver   can make recommendations and may suggest nasal or lung inhalers for other symptoms.  HOME CARE INSTRUCTIONS   Only take over-the-counter or prescription medicines for pain, discomfort, or fever as directed by your  caregiver.  Use a warm mist humidifier or inhale steam from a shower to increase air moisture. This may keep secretions moist and make it easier to breathe.  Drink enough water and fluids to keep your urine clear or pale yellow.  Rest as needed.  Return to work when your temperature has returned to normal or as your caregiver advises. You may need to stay home longer to avoid infecting others. You can also use a face mask and careful hand washing to prevent spread of the virus. SEEK MEDICAL CARE IF:   After the first few days, you feel you are getting worse rather than better.  You need your caregiver's advice about medicines to control symptoms.  You develop chills, worsening shortness of breath, or brown or red sputum. These may be signs of pneumonia.  You develop yellow or brown nasal discharge or pain in the face, especially when you bend forward. These may be signs of sinusitis.  You develop a fever, swollen neck glands, pain with swallowing, or white areas in the back of your throat. These may be signs of strep throat. SEEK IMMEDIATE MEDICAL CARE IF:   You have a fever.  You develop severe or persistent headache, ear pain, sinus pain, or chest pain.  You develop wheezing, a prolonged cough, cough up blood, or have a change in your usual mucus (if you have chronic lung disease).  You develop sore muscles or a stiff neck. Document Released: 03/18/2001 Document Revised: 12/15/2011 Document Reviewed: 12/28/2013 ExitCare Patient Information 2015 [employer]. This information is not intended to replace advice given to you by your health care provider. Make sure you discuss any questions you have with your health care provider.  

## 2015-02-08 NOTE — Progress Notes (Signed)
   Subjective:    Patient ID: Carla Berry, female    DOB: [birth year], 44 y.o.   MRN: [id number]  HPI UPPER RESPIRATORY INFECTION  Onset: Tuesday with cough, fevers and headaches started last night  Course: worsening Better with: mucinex, tylenol Meds tried: mucinex, tylenol Sick contacts: daughter, may be others as recent school camping trip that she chaperoned  Nasal discharge (color,laterality): none  Sinusitis Risk Factors Fever: yes   Headache/face pain: yes  Double sickening: no  Tooth pain: no   Allergy Risk Factors: Sneezing: no  Itchy scratchy throat: yes  Seasonal sx: no   Flu Risk Factors Headache: yes  Muscle aches: no  Severe fatigue: no    Red Flags  Stiff neck: no  Dyspnea: no  Rash: no  Swallowing difficulty: no       Review of Systems See HPI    Objective:   Physical Exam  Constitutional: She is oriented to person, place, and time. She appears well-developed and well-nourished. No distress.  HENT:  Head: Normocephalic and atraumatic.  Right Ear: External ear normal.  Left Ear: External ear normal.  Nose: Mucosal edema present. No rhinorrhea or sinus tenderness. Right sinus exhibits no maxillary sinus tenderness and no frontal sinus tenderness. Left sinus exhibits no maxillary sinus tenderness and no frontal sinus tenderness.  Mouth/Throat: Oropharynx is clear and moist. No oropharyngeal exudate.  Eyes: Conjunctivae are normal. Right eye exhibits no discharge. Left eye exhibits no discharge.  Cardiovascular: Normal rate, regular rhythm and normal heart sounds.   No murmur heard. Pulmonary/Chest: Effort normal and breath sounds normal. No respiratory distress. She has no wheezes.  Abdominal: She exhibits no distension.  Neurological: She is alert and oriented to person, place, and time.  Skin: Skin is warm and dry. No rash noted. She is not diaphoretic.  Psychiatric: She has a normal mood and affect. Her behavior is normal.  Nursing note and  vitals reviewed.         Assessment & Plan:

## 2015-02-08 NOTE — Assessment & Plan Note (Signed)
Likely viral URI, lungs clear on exam - hycodan, tessalon - rec humidifier, tylenol prn fevers - rtc for dyspnea, worsening

## 2015-02-20 ENCOUNTER — Encounter: Payer: Self-pay | Admitting: *Deleted

## 2015-02-20 NOTE — Progress Notes (Unsigned)
Prior Authorization receive from [facility] for Nexium. PA form placed in provider box for review.  Derl Barrow, RN

## 2015-02-20 NOTE — Progress Notes (Unsigned)
PA form faxed to [employer] [place] for review.  Martin, Tamika L, RN  

## 2015-02-20 NOTE — Progress Notes (Unsigned)
PA Nexium completed. Given to Candiss Norse, RN.

## 2015-03-22 ENCOUNTER — Other Ambulatory Visit: Payer: Self-pay | Admitting: Family Medicine

## 2015-03-22 ENCOUNTER — Telehealth: Payer: Self-pay | Admitting: Family Medicine

## 2015-03-22 MED ORDER — ESOMEPRAZOLE MAGNESIUM 40 MG PO PACK: 40.0000 mg | PACK | Freq: Every day | ORAL | Status: DC

## 2015-03-22 NOTE — Telephone Encounter (Signed)
Received a call from [facility] stating patient's insurance will no longer cover the Brand name Nexium and requesting to change to the generic form.  Please send in a new Rx for the generic.  Derl Barrow, RN

## 2015-03-22 NOTE — Telephone Encounter (Signed)
Pt called because her Nexium was called in as a liquid drink. She always takes the capsule. Can we call to correct this. jw

## 2015-03-22 NOTE — Telephone Encounter (Signed)
Will forward to MD. Kaleigh Spiegelman,CMA  

## 2015-03-23 ENCOUNTER — Other Ambulatory Visit: Payer: Self-pay | Admitting: Family Medicine

## 2015-03-23 MED ORDER — ESOMEPRAZOLE MAGNESIUM 40 MG PO CPDR: 40.0000 mg | DELAYED_RELEASE_CAPSULE | Freq: Every day | ORAL | Status: DC

## 2015-05-29 ENCOUNTER — Other Ambulatory Visit: Payer: Self-pay | Admitting: *Deleted

## 2015-05-29 MED ORDER — ESOMEPRAZOLE MAGNESIUM 40 MG PO CPDR: 40.0000 mg | DELAYED_RELEASE_CAPSULE | Freq: Every day | ORAL | Status: DC

## 2015-06-01 ENCOUNTER — Ambulatory Visit (INDEPENDENT_AMBULATORY_CARE_PROVIDER_SITE_OTHER): Payer: BLUE CROSS/BLUE SHIELD | Admitting: Family Medicine

## 2015-06-01 ENCOUNTER — Encounter: Payer: Self-pay | Admitting: Family Medicine

## 2015-06-01 VITALS — BP 130/80 | HR 85 | Temp 97.8°F | Ht 63.0 in | Wt 256.4 lb

## 2015-06-01 DIAGNOSIS — N898 Other specified noninflammatory disorders of vagina: Secondary | ICD-10-CM | POA: Diagnosis not present

## 2015-06-01 DIAGNOSIS — R1030 Lower abdominal pain, unspecified: Secondary | ICD-10-CM

## 2015-06-01 LAB — POCT URINALYSIS DIPSTICK
BILIRUBIN UA: NEGATIVE
Glucose, UA: NEGATIVE
KETONES UA: NEGATIVE
Leukocytes, UA: NEGATIVE
Nitrite, UA: NEGATIVE
Protein, UA: NEGATIVE
Spec Grav, UA: 1.02
UROBILINOGEN UA: 0.2
pH, UA: 7

## 2015-06-01 LAB — POCT WET PREP (WET MOUNT): CLUE CELLS WET PREP WHIFF POC: NEGATIVE

## 2015-06-01 LAB — POCT UA - MICROSCOPIC ONLY

## 2015-06-01 MED ORDER — FLUCONAZOLE 150 MG PO TABS: 150.0000 mg | ORAL_TABLET | Freq: Every day | ORAL | Status: DC

## 2015-06-01 NOTE — Progress Notes (Signed)
Patient ID: Carla Berry, female   DOB: 18-May-1971, 44 y.o.   MRN: [id number]    Subjective: CC: pubic pain, vaginal discharge  HPI: Patient is a 44 y.o. female presenting to clinic today for pubic pressure and vagina discharge.   For 2 weeks the patient has had suprapubic pain. She's noted dribbing at the end of urination. Occasionally has some dysuria. Urinary frequency (despite same fluid intake). No hematuria (but has had it in the past due to surgery trauma).  Having vaginal discharge that is white and possibly thin (never looks at it).  Sometimes there's an cheesy smell. No pruritus. No vaginal bleeding.   Patient states that since her BTL, that she's had groin pain in the middle of her cycle. She assumed it was due to ovulation in addition to her scar tissue. No fevers, N/V, dyspareunia, back pain. She has been married x 16 yrs and is not concerned about STDs.    LMP: 05/11/15   Social History: never smoker  ROS: All other systems reviewed and are negative.  Past Medical History Patient Active Problem List   Diagnosis Date Noted  . Cough 02/08/2015  . Encounter for routine gynecological examination 11/30/2014  . Personal history of vulvar dysplasia 11/30/2014  . Suprapubic pain 11/30/2014  . Skin irritation 11/30/2014  . Metabolic syndrome 21/97/5883  . Skin lesion of chest wall 11/17/2014  . Severe obesity (BMI >= 40) 12/03/2006  . RHINITIS, ALLERGIC 12/03/2006  . GASTROESOPHAGEAL REFLUX, NO ESOPHAGITIS 12/03/2006  . History of nephrolithiasis 12/03/2006  . Prediabetes 12/03/2006  . 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 acetaminophen (TYLENOL) 500 MG tablet Take 500-1,000 mg by mouth every 6 (six) hours as needed for pain or fever.    . benzonatate (TESSALON) 200 MG capsule Take 1 capsule (200 mg total) by mouth 3 (three) times daily as needed for  cough. 30 capsule 0  . cetirizine (ZYRTEC) 10 MG tablet Take 10 mg by mouth daily.    Marland Kitchen esomeprazole (NEXIUM) 40 MG capsule Take 1 capsule (40 mg total) by mouth daily at 12 noon. 30 capsule PRN  . fluconazole (DIFLUCAN) 150 MG tablet Take 1 tablet (150 mg total) by mouth daily. 1 tablet 1  . HYDROcodone-homatropine (HYCODAN) 5-1.5 MG/5ML syrup Take 5 mLs by mouth every 4 (four) hours as needed for cough. 240 mL 0  . nystatin cream (MYCOSTATIN) Apply 1 application topically 2 (two) times daily. 30 g 0  . Probiotic Product (PROBIOTIC DAILY PO) Take 1 capsule by mouth daily as needed.     No current facility-administered medications for this visit.    Objective: Office vital signs reviewed. BP 130/80 mmHg  Pulse 85  Temp(Src) 97.8 F (36.6 C) (Oral)  Ht 5\' 3"  (1.6 m)  Wt 256 lb 6.4 oz (116.302 kg)  BMI 45.43 kg/m2   Physical Examination:  General: Awake, alert, well- nourished, NAD GI: +BS, soft, mildly TTP in the suprapubic region. No rebound or guarding. GYN:  External genitalia within normal limits.  Vaginal mucosa pink, moist, normal rugae.  Nonfriable cervix without lesions, no bleeding noted on speculum exam. Yellow, thick, curd-like discharge. Bimanual exam revealed normal, nongravid uterus.  No cervical motion tenderness. No adnexal masses bilaterally.    U/A: occasional RBC, 1-5 squams, neg LE and nitrites Wet prep: Many rods, no yeast, clue cells, or trichomonas   Assessment/Plan: Suprapubic pain Given that exam findings  are c/w a yeast infection (and many rods were noted on wet prep which are usually associated with yeast infection), this could be one etiology. I would not expect mittelschmerz to last for this long. No evidence of UTI. Previous pelvic U/S with only fibroids noted. No systemic symptoms - Rx for Diflucan. - Discussed RTC precautions: fevers, chills, new onset lower back pain, more pain with urination.  - Patient to return in 4 days if symptoms have not  resolved.     Orders Placed This Encounter  Procedures  . POCT urinalysis dipstick  . POCT Wet Prep [employer])  . POCT UA - Microscopic Only    Meds ordered this encounter  Medications  . fluconazole (DIFLUCAN) 150 MG tablet    Sig: Take 1 tablet (150 mg total) by mouth daily.    Dispense:  1 tablet    Refill:  [place] PGY-2, [place]

## 2015-06-01 NOTE — Assessment & Plan Note (Signed)
Given that exam findings are c/w a yeast infection (and many rods were noted on wet prep which are usually associated with yeast infection), this could be one etiology. I would not expect mittelschmerz to last for this long. No evidence of UTI. Previous pelvic U/S with only fibroids noted. No systemic symptoms - Rx for Diflucan. - Discussed RTC precautions: fevers, chills, new onset lower back pain, more pain with urination.  - Patient to return in 4 days if symptoms have not resolved.

## 2015-06-01 NOTE — Patient Instructions (Signed)
I have prescribed you Diflucan for a yeast infection If the pain and pressure is not improving after the treatment, please return

## 2015-07-11 ENCOUNTER — Emergency Department (INDEPENDENT_AMBULATORY_CARE_PROVIDER_SITE_OTHER)
Admission: EM | Admit: 2015-07-11 | Discharge: 2015-07-11 | Disposition: A | Discharge status: Home / Self Care | Payer: BLUE CROSS/BLUE SHIELD | Source: Home / Self Care | Attending: Emergency Medicine | Admitting: Emergency Medicine

## 2015-07-11 ENCOUNTER — Emergency Department (INDEPENDENT_AMBULATORY_CARE_PROVIDER_SITE_OTHER): Payer: BLUE CROSS/BLUE SHIELD

## 2015-07-11 ENCOUNTER — Encounter (HOSPITAL_COMMUNITY): Payer: Self-pay | Admitting: Emergency Medicine

## 2015-07-11 DIAGNOSIS — J40 Bronchitis, not specified as acute or chronic: Secondary | ICD-10-CM

## 2015-07-11 DIAGNOSIS — R0789 Other chest pain: Secondary | ICD-10-CM

## 2015-07-11 MED ORDER — ALBUTEROL SULFATE HFA 108 (90 BASE) MCG/ACT IN AERS: 2.0000 | INHALATION_SPRAY | RESPIRATORY_TRACT | Status: DC | PRN

## 2015-07-11 MED ORDER — METHYLPREDNISOLONE ACETATE 80 MG/ML IJ SUSP
80.0000 mg | Freq: Once | INTRAMUSCULAR | Status: AC
  Administered 2015-07-11: 80 mg via INTRAMUSCULAR

## 2015-07-11 MED ORDER — METHYLPREDNISOLONE ACETATE 80 MG/ML IJ SUSP
INTRAMUSCULAR | Status: AC
  Filled 2015-07-11: qty 1

## 2015-07-11 MED ORDER — AZITHROMYCIN 250 MG PO TABS: ORAL_TABLET | ORAL | Status: DC

## 2015-07-11 NOTE — ED Notes (Signed)
C/o bad headache, reports everyday since Monday.  Reports pain with breathing.  Uri started 9/18.  Using otc cough medicine

## 2015-07-11 NOTE — ED Notes (Signed)
Patient instructed on gown

## 2015-07-11 NOTE — ED Provider Notes (Signed)
CSN: [id number]     Arrival date & time 07/11/15  1300 History   First MD Initiated Contact with Patient 07/11/15 1325     Chief Complaint  Patient presents with  . Chest Pain  CC: chest pain  (Consider location/radiation/quality/duration/timing/severity/associated sxs/prior Treatment) HPI  She is a 44 year old woman here for evaluation of chest pain and shortness of breath. She states her symptoms started about 2-1/2 weeks ago with cold symptoms. She describes nasal congestion, rhinorrhea, scratchy throat, and low-grade fever. She states this improved after a few days. Then, a week later she developed a nonproductive cough. She had some cough medicine left over from bronchitis last year that she took which improved the cough. She again stated she started to feel better. Then, 5 days ago, she started developing headaches and some chest pain with deep breaths. This has persisted. The pain in her chest has been getting worse. She states it hurts to take a deep breath as well as to move. She also reports feeling short of breath on exertion. She denies any current cough. No fevers or chills.  No associated dizziness or diaphoresis. No nausea or vomiting. Other than gastritis in allergies, she states she is healthy. No family history of early cardiac disease.  Past Medical History  Diagnosis Date  . Gastric polyp 07/23/12    Pathology: Chronic inactive gastritis.  EGD by Dr Verdia Kuba (GI)  . Gastritis, acute 07/16/2012    Dr Collene Mares (GI) ordered abdominal US that was normal (07/13/12).  Dr Collene Mares ordered Nuc Med Hepatobiliary Tc99 scan with cholecystikinin stimulation which was normal on 07/13/12.  Upper Endoscopy by Dr Collene Mares on 07/21/12 showed mild antral gastritis and sessile gastric polyps that were benign on histopathology    . FIBROIDS, UTERUS 02/08/2007    Qualifier: Diagnosis of  By: McDiarmid MD, Sherren Mocha    . MIGRAINE, UNSPEC., W/O INTRACTABLE MIGRAINE 12/03/2006    Qualifier: Diagnosis of  By: Drucie Ip    . History of nephrolithiasis 12/03/2006    Qualifier: History of  By: McDiarmid MD, Sherren Mocha    . PALPITATIONS, RECURRENT 07/31/2008    Qualifier: History of  By: McDiarmid MD, Sherren Mocha    . TOBACCO USE, QUIT 12/11/2009    Qualifier: Diagnosis of  By: Shelbie Proctor    . PAPANICOLAOU SMEAR, ABNORMAL 12/03/2006    Qualifier: History of By: McDiarmid MD, Sherren Mocha     Past Surgical History  Procedure Laterality Date  . Tubal ligation    . Tonsillectomy    . Cesarean section      x 2  . Colonoscopy N/A 02/08/2013    Procedure: COLONOSCOPY;  Surgeon: Beryle Beams, MD;  Location[facility] ENDOSCOPY;  Service: Endoscopy;  Laterality: N/A;  . 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 Hypertension Mother   . Diabetes Mother    Social History  Substance Use Topics  . Smoking status: Former Smoker    Quit date: 07/07/2003  . Smokeless tobacco: None  . Alcohol Use: No   OB History    No data available     Review of Systems As in history of present illness Allergies  Codeine and Prednisone  Home Medications   Prior to Admission medications   Medication Sig Start Date End Date Taking? Authorizing Provider  cetirizine (ZYRTEC) 10 MG tablet Take 10 mg by mouth daily.   Yes Historical Provider, MD  esomeprazole ([place])  40 MG capsule Take 1 capsule (40 mg total) by mouth daily at 12 noon. 05/29/15  Yes Blane Ohara McDiarmid, MD  acetaminophen (TYLENOL) 500 MG tablet Take 500-1,000 mg by mouth every 6 (six) hours as needed for pain or fever.    Historical Provider, MD  albuterol (PROVENTIL HFA;VENTOLIN HFA) 108 (90 BASE) MCG/ACT inhaler Inhale 2 puffs into the lungs every 4 (four) hours as needed for wheezing or shortness of breath. 07/11/15   Melony Overly, MD  azithromycin (ZITHROMAX Z-PAK) 250 MG tablet Take 2 pills today, then 1 pill daily until gone. 07/11/15   Melony Overly, MD  benzonatate (TESSALON) 200 MG capsule Take 1 capsule (200 mg  total) by mouth 3 (three) times daily as needed for cough. 02/08/15   Frazier Richards, MD  HYDROcodone-homatropine [facility]) 5-1.5 MG/5ML syrup Take 5 mLs by mouth every 4 (four) hours as needed for cough. 02/08/15   Frazier Richards, MD  Probiotic Product (PROBIOTIC DAILY PO) Take 1 capsule by mouth daily as needed.    Historical Provider, MD   Meds Ordered and Administered this Visit   Medications  methylPREDNISolone acetate (DEPO-MEDROL) injection 80 mg (not administered)    BP 137/92 mmHg  Pulse 90  Temp(Src) 97.6 F (36.4 C) (Oral)  Resp 14  SpO2 98%  LMP 07/06/2015 No data found.   Physical Exam  Constitutional: She is oriented to person, place, and time. She appears well-developed and well-nourished. No distress.  HENT:  Nose: Nose normal.  Mouth/Throat: No oropharyngeal exudate.  Small amount of postnasal drainage  Neck: Neck supple.  Cardiovascular: Normal rate, regular rhythm and normal heart sounds.   No murmur heard. Pulmonary/Chest: Effort normal and breath sounds normal. No respiratory distress. She has no wheezes. She has no rales. She exhibits tenderness (diffusely across the anterior chest).  Lymphadenopathy:    She has no cervical adenopathy.  Neurological: She is alert and oriented to person, place, and time.    ED Course  Procedures (including critical care time) ED ECG REPORT   Date: 07/11/2015  Rate: 70  Rhythm: normal sinus rhythm  QRS Axis: normal  Intervals: normal  ST/T Wave abnormalities: normal  Conduction Disutrbances:none  Narrative Interpretation: normal ekg  Old EKG Reviewed: unchanged  I have personally reviewed the EKG tracing and agree with the computerized printout as noted.   Labs Review Labs Reviewed - No data to display  Imaging Review Dg Chest 2 View  07/11/2015   CLINICAL DATA:  44 year old female with cold like symptoms for a week now with chest pain on inspiration and fever  EXAM: CHEST  2 VIEW  COMPARISON:  Prior acute  abdominal series including chest x-ray 12/26/2012  FINDINGS: The lungs are clear and negative for focal airspace consolidation, pulmonary edema or suspicious pulmonary nodule. No pleural effusion or pneumothorax. Cardiac and mediastinal contours are within normal limits. No acute fracture or lytic or blastic osseous lesions. The visualized upper abdominal bowel gas pattern is unremarkable.  IMPRESSION: No active cardiopulmonary disease.   Electronically Signed   By: Jacqulynn Cadet M.D.   On: 07/11/2015 14:09      MDM   1. Bronchitis   2. Chest wall pain    Vital signs are stable. She is not hypoxic. She has normal work of breathing. EKG is normal. Chest pain is reproducible on exam. We will treat for bronchitis with azithromycin. She is unable to tolerate prednisone by mouth as it causes vomiting. Will give Depo-Medrol here  prior to discharge. Prescription for albuterol inhaler given to use as needed. Follow-up as needed.    Melony Overly, MD 07/11/15 [phone]

## 2015-07-11 NOTE — Discharge Instructions (Signed)
You have bronchitis. Take azithromycin as prescribed. Use the cough syrup you have as needed for cough. Use the albuterol every 4 hours as needed for shortness of breath. You should see improvement in the next 3-5 days. If you develop fevers, difficulty breathing, or are just not getting better, please come back or go to the emergency room.  The pain in your chest is coming from inflammation of your chest wall. The shot we gave you today should help with this. You can also apply ice packs.

## 2015-07-26 ENCOUNTER — Other Ambulatory Visit: Payer: Self-pay | Admitting: Gastroenterology

## 2015-07-26 DIAGNOSIS — R1011 Right upper quadrant pain: Secondary | ICD-10-CM

## 2015-08-13 ENCOUNTER — Other Ambulatory Visit (HOSPITAL_COMMUNITY): Payer: BLUE CROSS/BLUE SHIELD

## 2015-08-13 ENCOUNTER — Ambulatory Visit (HOSPITAL_COMMUNITY): Payer: BLUE CROSS/BLUE SHIELD

## 2015-08-17 ENCOUNTER — Ambulatory Visit (HOSPITAL_COMMUNITY)
Admission: RE | Admit: 2015-08-17 | Discharge: 2015-08-17 | Disposition: A | Discharge status: Home / Self Care | Payer: BLUE CROSS/BLUE SHIELD | Source: Ambulatory Visit | Attending: Gastroenterology | Admitting: Gastroenterology

## 2015-08-17 DIAGNOSIS — R1011 Right upper quadrant pain: Secondary | ICD-10-CM | POA: Diagnosis present

## 2015-08-17 MED ORDER — TECHNETIUM TC 99M MEBROFENIN IV KIT
5.0000 | PACK | Freq: Once | INTRAVENOUS | Status: DC | PRN
Start: 2015-08-17 — End: 2015-08-23
  Administered 2015-08-17: 5.35 via INTRAVENOUS
  Filled 2015-08-17: qty 6

## 2015-08-17 MED ORDER — SINCALIDE 5 MCG IJ SOLR: 2.0000 ug | Freq: Once | INTRAMUSCULAR | Status: DC

## 2015-08-20 ENCOUNTER — Encounter: Payer: Self-pay | Admitting: Family Medicine

## 2015-09-11 ENCOUNTER — Ambulatory Visit (INDEPENDENT_AMBULATORY_CARE_PROVIDER_SITE_OTHER): Payer: BLUE CROSS/BLUE SHIELD | Admitting: Family Medicine

## 2015-09-11 ENCOUNTER — Encounter: Payer: Self-pay | Admitting: Family Medicine

## 2015-09-11 VITALS — BP 123/75 | HR 87 | Temp 97.8°F | Ht 63.5 in | Wt 257.2 lb

## 2015-09-11 DIAGNOSIS — R05 Cough: Secondary | ICD-10-CM

## 2015-09-11 DIAGNOSIS — R059 Cough, unspecified: Secondary | ICD-10-CM

## 2015-09-11 MED ORDER — MONTELUKAST SODIUM 10 MG PO TABS: 10.0000 mg | ORAL_TABLET | Freq: Every day | ORAL | Status: DC

## 2015-09-11 NOTE — Assessment & Plan Note (Signed)
Pulmonary exam normal and very reassuring. I reviewed her chest xray from Oct, it was normal. She is s/p Zpak and Ceftidinir. Her cough might be related to allergy. Plan to start her on Singulair. May use hycodan as needed for cough. Plan to repeat chest xray if no improvement of her symptoms or if worsening. Return precaution discussed. Consider PFT for COPD given hx of tobacco smoking. F/U as needed.

## 2015-09-11 NOTE — Patient Instructions (Signed)
It was nice seeing you today. I am sorry you are still coughing. This could be allergy related. I will like you to start Singulair for now. If no improvement or cough is worsening please call us. You might need a repeat chest xray then or even lung (PFT) to assess for COPD or asthma. Please see [place] as needed.

## 2015-09-11 NOTE — Progress Notes (Signed)
Subjective:     Patient ID: Carla Berry, female   DOB: Nov 20, 1970, 44 y.o.   MRN: [id number]  Cough This is a new problem. The current episode started more than 1 month ago (Initial onset was in Sept. She went to [facility] urgent care  in Oct and was treated with Zpak. Cough improved some and started back  in Nov. She went to [facility] and was started onCefdinir which she is still on). The problem has been waxing and waning. The cough is non-productive. Associated symptoms include chest pain, headaches and wheezing. Pertinent negatives include no fever, hemoptysis or shortness of breath. Associated symptoms comments: Chest pain with coughing, since she started Cefdinir she has not had fever. Nothing aggravates the symptoms. Risk factors: nothing, she smoked more than 12 years ago. She has tried a beta-agonist inhaler and prescription cough suppressant (antibiotic) for the symptoms. Her past medical history is significant for bronchitis and environmental allergies. There is no history of asthma or COPD. Allergic to mold and dust mite.  She completed her course of Cefdinir on 08/31/15, she felt better for some time and then she started coughing again 2 wks after stopping the A/B.  She is on hycodan prn cough which helps some. Cough is gradually worsening.  Current Outpatient Prescriptions on File Prior to Visit  Medication Sig Dispense Refill  . acetaminophen (TYLENOL) 500 MG tablet Take 500-1,000 mg by mouth every 6 (six) hours as needed for pain or fever.    Marland Kitchen albuterol (PROVENTIL HFA;VENTOLIN HFA) 108 (90 BASE) MCG/ACT inhaler Inhale 2 puffs into the lungs every 4 (four) hours as needed for wheezing or shortness of breath. 1 Inhaler 0  . cetirizine (ZYRTEC) 10 MG tablet Take 10 mg by mouth daily.    Marland Kitchen esomeprazole (NEXIUM) 40 MG capsule Take 1 capsule (40 mg total) by mouth daily at 12 noon. 30 capsule PRN  . HYDROcodone-homatropine (HYCODAN) 5-1.5 MG/5ML syrup Take 5 mLs by mouth every  4 (four) hours as needed for cough. 240 mL 0  . Probiotic Product (PROBIOTIC DAILY PO) Take 1 capsule by mouth daily as needed.    . benzonatate (TESSALON) 200 MG capsule Take 1 capsule (200 mg total) by mouth 3 (three) times daily as needed for cough. (Patient not taking: Reported on 09/11/2015) 30 capsule 0   No current facility-administered medications on file prior to visit.   Past Medical History  Diagnosis Date  . Gastric polyp 07/23/12    Pathology: Chronic inactive gastritis.  EGD by Dr Verdia Kuba (GI)  . Gastritis, acute 07/16/2012    Dr Collene Mares (GI) ordered abdominal US that was normal (07/13/12).  Dr Collene Mares ordered Nuc Med Hepatobiliary Tc99 scan with cholecystikinin stimulation which was normal on 07/13/12.  Upper Endoscopy by Dr Collene Mares on 07/21/12 showed mild antral gastritis and sessile gastric polyps that were benign on histopathology    . FIBROIDS, UTERUS 02/08/2007    Qualifier: Diagnosis of  By: McDiarmid MD, Sherren Mocha    . MIGRAINE, UNSPEC., W/O INTRACTABLE MIGRAINE 12/03/2006    Qualifier: Diagnosis of  By: Drucie Ip    . History of nephrolithiasis 12/03/2006    Qualifier: History of  By: McDiarmid MD, Sherren Mocha    . PALPITATIONS, RECURRENT 07/31/2008    Qualifier: History of  By: McDiarmid MD, Sherren Mocha    . TOBACCO USE, QUIT 12/11/2009    Qualifier: Diagnosis of  By: Shelbie Proctor    . PAPANICOLAOU SMEAR, ABNORMAL 12/03/2006  Qualifier: History of By: McDiarmid MD, Sherren Mocha    . Skin lesion of chest wall 11/17/2014  . Suprapubic pain 11/30/2014  . 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  Respiratory: Positive for cough and wheezing. Negative for hemoptysis and shortness of breath.   Cardiovascular: Positive for chest pain.  Gastrointestinal: Negative.   Allergic/Immunologic: Positive for environmental allergies.  Neurological: Positive for headaches.  All other  systems reviewed and are negative.  Filed Vitals:   09/11/15 1039 09/11/15 1058  BP: 155/105 123/75  Pulse: 73 87  Temp: 97.8 F (36.6 C)   TempSrc: Oral   Height: 5' 3.5" (1.613 m)   Weight: 257 lb 3 oz (116.659 kg)   SpO2: 100%         Objective:   Physical Exam  Constitutional: She is oriented to person, place, and time. She appears well-developed. No distress.  Neck: Neck supple.  Cardiovascular: Normal rate, regular rhythm and normal heart sounds.   No murmur heard. Pulmonary/Chest: Effort normal and breath sounds normal. She has no decreased breath sounds. She has no wheezes. She has no rhonchi. She has no rales.  Abdominal: Soft. Bowel sounds are normal. She exhibits no distension and no mass. There is no tenderness.  Neurological: She is alert and oriented to person, place, and time.  Nursing note and vitals reviewed.      Assessment:     Cough     Plan:     Check problem list.

## 2015-09-14 ENCOUNTER — Telehealth: Payer: Self-pay | Admitting: *Deleted

## 2015-09-14 ENCOUNTER — Telehealth: Payer: Self-pay | Admitting: Family Medicine

## 2015-09-14 ENCOUNTER — Other Ambulatory Visit: Payer: Self-pay | Admitting: Family Medicine

## 2015-09-14 ENCOUNTER — Ambulatory Visit
Admission: RE | Admit: 2015-09-14 | Discharge: 2015-09-14 | Disposition: A | Discharge status: Home / Self Care | Payer: BLUE CROSS/BLUE SHIELD | Source: Ambulatory Visit | Attending: Family Medicine | Admitting: Family Medicine

## 2015-09-14 DIAGNOSIS — R05 Cough: Secondary | ICD-10-CM

## 2015-09-14 DIAGNOSIS — R053 Chronic cough: Secondary | ICD-10-CM

## 2015-09-14 DIAGNOSIS — R059 Cough, unspecified: Secondary | ICD-10-CM

## 2015-09-14 MED ORDER — BENZONATATE 200 MG PO CAPS: 200.0000 mg | ORAL_CAPSULE | Freq: Three times a day (TID) | ORAL | Status: DC | PRN

## 2015-09-14 NOTE — Telephone Encounter (Signed)
She can go for xray right away. Order placed.

## 2015-09-14 NOTE — Telephone Encounter (Signed)
I spoke with patient about negative chest xray. She is concern that she coughs all night. I recommended she calls the front office to schedule PFT with Dr. Maryjean Ka. Note she had a normal PFT in 2011. I recommended honey and also e-scribed tessalon pearl to her pharmacy. Return precaution discussed.

## 2015-09-14 NOTE — Telephone Encounter (Signed)
May schedule follow up with her PCP next week if no improvement and if xray is normal.

## 2015-09-14 NOTE — Telephone Encounter (Signed)
Please set up earliest available appointment and let her see her PCP for f/u soon.

## 2015-09-14 NOTE — Telephone Encounter (Signed)
Spoke with patient and she will plan to go get this xray done today and would a call by the end of the day so she knows the next steps.  Patient is aware that she will likely need another appt next week if there is no relief.  She states that her medication isn't helping and that she is still not sleeping despite the cough medication.

## 2015-09-14 NOTE — Telephone Encounter (Signed)
Also recommend honey for cough. This can help her feel better at night.

## 2015-09-14 NOTE — Telephone Encounter (Signed)
Contacted pt to inform her of below and she wanted to know what she should do, she hasnt slept in 2 days and whatever she is doing for the cough is not working.  She said that she was told that if the xray came back normal there was going to be some other test run.  I told her that there was nothing in this note about another test and that I would send a message to the doctor that had sent the message to be advised. [name], Benicia Bergevin D, [place]

## 2015-09-14 NOTE — Telephone Encounter (Signed)
-----   Message from Kinnie Feil, MD sent at 09/14/2015  2:04 PM EST ----- Please advise patient her chest xray is normal with no PNA. Continue current regimen for cough. F/U with PCP next week for further management or to the ED if symptoms worsens.

## 2015-09-14 NOTE — Telephone Encounter (Signed)
Yes I recommended PFT in my note. Please schedule this for her with Dr. Maryjean Ka. Thank you

## 2015-09-14 NOTE — Telephone Encounter (Signed)
Will forward to Dr. Eniola. Carla Berry,CMA  

## 2015-09-14 NOTE — Telephone Encounter (Signed)
Pt called because she is not feeling any better. She said that the cough medication is not helping. She said that Dr. Gwendlyn Deutscher told her to call back if she isn't feeling better she will order a chest x-ray. Please let patient know when to go. jw

## 2015-09-14 NOTE — Telephone Encounter (Signed)
Contacted pt to inform her of below and try to set up a appt with Koval but he doesn't have anything available until January, Forwarding to Dr. Gwendlyn Deutscher to see what she would like to do. [name], April D, [place]

## 2015-09-17 NOTE — Telephone Encounter (Signed)
Checked schedule for pcp before calling patient and he doesn't have any openings until 10-11-15. Kyrin Garn,CMA

## 2015-09-17 NOTE — Telephone Encounter (Signed)
Noted  

## 2015-09-17 NOTE — Telephone Encounter (Signed)
To do: 1. Please set up appointment with Dr Maryjean Ka for PFT. Earliest available appointment. 2. Please set up follow up appointment with PCP. Earliest available. 3. If patient need to be seen sooner, have her come in to [facility] or another provider. 4. Please send subsequent communication to her PCP so he is aware of what is going on with her.  Thanks.

## 2015-09-17 NOTE — Telephone Encounter (Signed)
Pt scheduled for PFTS on Thursday 09/20/15 with Dr. Valentina Lucks, and f/u with Dr. McDiarmid on 10/11/15. Fleeger, Salome Spotted

## 2015-09-20 ENCOUNTER — Encounter: Payer: Self-pay | Admitting: Pharmacist

## 2015-09-20 ENCOUNTER — Ambulatory Visit (INDEPENDENT_AMBULATORY_CARE_PROVIDER_SITE_OTHER): Payer: BLUE CROSS/BLUE SHIELD | Admitting: Pharmacist

## 2015-09-20 VITALS — BP 141/98 | HR 109 | Ht 64.0 in | Wt 257.7 lb

## 2015-09-20 DIAGNOSIS — R05 Cough: Secondary | ICD-10-CM

## 2015-09-20 DIAGNOSIS — R059 Cough, unspecified: Secondary | ICD-10-CM

## 2015-09-20 NOTE — Addendum Note (Signed)
Addended by: Leavy Cella on: 09/20/2015 04:52 PM   Modules accepted: Orders

## 2015-09-20 NOTE — Progress Notes (Signed)
Patient ID: Carla Berry, female   DOB: 18-Feb-1971, 44 y.o.   MRN: [id number] Reviewed: Agree with Dr. Graylin Shiver documentation and management.

## 2015-09-20 NOTE — Progress Notes (Signed)
S:    Patient arrives in good spirits, ambulating without assistance.    Presents for lung function evaluation.  Patient reports breathing has been about 80% compared to prior to recent lung infection problems/pneumonia.  Patient reports she started taking Mucinex DM BID on Monday, 09/17/15 and reports cough has improved since that time and she is now able to sleep throughout the night lying down in her bed.  Patient reports last dose of albuterol was Tuesday evening.     O: See "scanned report" or Documentation Flowsheet (discrete results - PFTs) for  Spirometry results. Patient provided good effort while attempting spirometry.    A/P: Spirometry evaluation reveals near normal lung function.  Patient is still recovering from a recent lung infection.  Patient has been experiencing cough since 08/17/15.  Patient was diagnosed with bronchitis then pneumonia. She started taking Mucinex DM twice daily on Monday and is taking albuterol as needed. continue  current treatment plan at this time.  Educated patient on purpose, proper use, potential adverse effects.  Reviewed results of pulmonary function tests.  Pt verbalized understanding of results and education.  Written pt instructions provided.  F/U Clinic visit with Dr. Wendy Poet on 10/11/15.   Total time in face to face counseling 25 minutes.  Patient seen with Elisabeth Most, Pharmacy Resident.

## 2015-09-20 NOTE — Assessment & Plan Note (Signed)
Spirometry evaluation reveals near normal lung function.  Patient is still recovering from a recent lung infection.  Patient has been experiencing cough since 08/17/15.  Patient was diagnosed with bronchitis then pneumonia. She started taking Mucinex DM twice daily on Monday and is taking albuterol as needed. continue  current treatment plan at this time.  Educated patient on purpose, proper use, potential adverse effects.  Reviewed results of pulmonary function tests.  Pt verbalized understanding of results and education.  Written pt instructions provided.  F/U Clinic visit with Dr. Wendy Poet on 10/11/15.   Total time in face to face counseling 25 minutes.  Patient seen with Elisabeth Most, Pharmacy Resident.

## 2015-09-20 NOTE — Patient Instructions (Signed)
Thanks for coming in today for lung function testing.   Today's test is near normal.  This is good since you are still recovering from your most recent lung infection.    Please continue your Mucinex DM twice daily until you feel well.   This may take a few more days or even weeks until you feel back to normal.  Follow up with Dr. Wendy Poet.

## 2015-10-11 ENCOUNTER — Encounter: Payer: Self-pay | Admitting: Family Medicine

## 2015-10-11 ENCOUNTER — Other Ambulatory Visit: Payer: Self-pay | Admitting: Family Medicine

## 2015-10-11 ENCOUNTER — Ambulatory Visit (INDEPENDENT_AMBULATORY_CARE_PROVIDER_SITE_OTHER): Payer: BLUE CROSS/BLUE SHIELD | Admitting: Family Medicine

## 2015-10-11 VITALS — BP 133/95 | HR 80 | Temp 97.8°F | Wt 251.7 lb

## 2015-10-11 DIAGNOSIS — K30 Functional dyspepsia: Secondary | ICD-10-CM

## 2015-10-11 DIAGNOSIS — K219 Gastro-esophageal reflux disease without esophagitis: Secondary | ICD-10-CM

## 2015-10-11 DIAGNOSIS — R7303 Prediabetes: Secondary | ICD-10-CM

## 2015-10-11 DIAGNOSIS — Z23 Encounter for immunization: Secondary | ICD-10-CM | POA: Diagnosis not present

## 2015-10-11 DIAGNOSIS — R05 Cough: Secondary | ICD-10-CM

## 2015-10-11 DIAGNOSIS — R053 Chronic cough: Secondary | ICD-10-CM

## 2015-10-11 NOTE — Patient Instructions (Signed)
Options for [facility]: Contact Dr Zella Ball (Clinical Psychologist, [phone])   [facility] (sees children)  Aurelio Jew, Psychologist, [phone]  [facility] ([place])- primarily for grief counseling  Psychologist Today online: Architect.  Find counselors for both children and adults.

## 2015-10-12 ENCOUNTER — Encounter: Payer: Self-pay | Admitting: Family Medicine

## 2015-10-12 DIAGNOSIS — R053 Chronic cough: Secondary | ICD-10-CM | POA: Insufficient documentation

## 2015-10-12 DIAGNOSIS — K30 Functional dyspepsia: Secondary | ICD-10-CM | POA: Insufficient documentation

## 2015-10-12 DIAGNOSIS — R05 Cough: Secondary | ICD-10-CM | POA: Insufficient documentation

## 2015-10-12 HISTORY — DX: Chronic cough: R05.3

## 2015-10-12 NOTE — Assessment & Plan Note (Signed)
Resolved chronic cough Pt places faith in role of Mucinex DM in resolving the cough.  Uncertain origin: Likely multifactorial including contributions from Post-viral respiratory infection, post-nasal drip, possible sinusitis, and GERD/Functional Dyspepsia.  Monitor for recurrence.

## 2015-10-12 NOTE — Assessment & Plan Note (Signed)
New diagnosis for patient from GI (Dr Collene Mares) Starting Medicinal Food, FDGard (caraway and peppermint oil).  Small RCT studies have shown evidence of benefit in GER symptoms in functional dyspepsia. Restarting Probiotic

## 2015-10-12 NOTE — Assessment & Plan Note (Signed)
Need to check A1c at next ov Weight down 7 pounds with exercise and diet.

## 2015-10-12 NOTE — Progress Notes (Signed)
   Subjective:    Patient ID: Carla Berry, female    DOB: [birth year], 45 y.o.   MRN: [id number]  HPI   Cough, chronic Onset: Early Oct 2016 first visit to ED for cough and URI symptoms. CXR unremarkable. Tx'd with Z-Pak and IM Depomedrol& Albuterol MDI prn.  Seen at [facility] for same cough, tx'd with Cefdinir course which ended 08/31/15 and Hycodan cough syrup without improvement.   [facility] ov 12/6 for similar symptoms thought to have allergic origin to cough tx'd with LTRA.  CXR again was unremarkable as were PFTs by Dr Valentina Lucks on 12/15 where patient's cough began to subside. Pt attributes improvement with her self initiated tx with Mucinex DM. Only occasional cough now.   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 from Dr Collene Mares (GI) after workup of RUQ pain - Starting FD Gard (plant oils of caraway seed and peppermint) and restarting Probiotic rx by Dr Collene Mares for FD  Marital Strife - Open acknowledgment by Carla Berry and her husband within the last month with concerns of husband's infidelity - Multiple stressors including personal health, health of family members, multiple responsibilities, interrelationships - Open to counseling and support  SH: No smoking   Review of Systems No fever Denies prolonged sadness or ahedonia    Objective:   Physical Exam VS reviewed GEN: Alert, Cooperative, Groomed, NAD, social HEENT: PERRL; EAC bilaterally not occluded, TM's translucent with normal LM, (+) LR; COR: RRR, No M/G/R, No JVD, Normal PMI size and location LUNGS: BCTA, No Acc mm use, speaking in full sentences Gait: Normal speed, No significant path deviation, Step through +,  Psych: Eyes tear up when discussing marital conflict but self consoles. Affect congruent with expressed mood.  Normal thought and speech. Language cexpresses both abstract and concrete ideas.     Assessment & Plan:

## 2015-10-29 ENCOUNTER — Ambulatory Visit (INDEPENDENT_AMBULATORY_CARE_PROVIDER_SITE_OTHER): Payer: BLUE CROSS/BLUE SHIELD | Admitting: *Deleted

## 2015-10-29 VITALS — BP 146/90 | HR 85

## 2015-10-29 DIAGNOSIS — Z0130 Encounter for examination of blood pressure without abnormal findings: Secondary | ICD-10-CM

## 2015-10-29 DIAGNOSIS — Z136 Encounter for screening for cardiovascular disorders: Secondary | ICD-10-CM

## 2015-10-29 NOTE — Progress Notes (Signed)
   Patient in nurse clinic for blood pressure check.  Patient denies any symptoms today.  BP 146/90 left arm manually.  Patient was upset because she was told by someone to walk into clinic for her blood pressure.  Patient's PCP wanted her blood pressures taken 3 time in a month.  Advise patient that everyone need an appointment to be seen.  Scheduled next visit 11/05/15 at 3:45 PM.  Derl Barrow, RN

## 2015-11-05 ENCOUNTER — Ambulatory Visit (INDEPENDENT_AMBULATORY_CARE_PROVIDER_SITE_OTHER): Payer: BLUE CROSS/BLUE SHIELD | Admitting: *Deleted

## 2015-11-05 VITALS — BP 148/90

## 2015-11-05 DIAGNOSIS — Z136 Encounter for screening for cardiovascular disorders: Secondary | ICD-10-CM

## 2015-11-05 DIAGNOSIS — Z0130 Encounter for examination of blood pressure without abnormal findings: Secondary | ICD-10-CM

## 2015-11-05 NOTE — Progress Notes (Signed)
   Patient in nurse clinic for blood pressure check.  Patient denies any symptoms today.  Patient stated this would be last office visit for blood pressure check because did not have any more opening around 4 PM.  Will forward to PCP.   Today's Vitals   11/05/15 1604  BP: 148/90

## 2015-11-06 ENCOUNTER — Encounter: Payer: Self-pay | Admitting: Family Medicine

## 2015-11-06 ENCOUNTER — Telehealth: Payer: Self-pay | Admitting: Family Medicine

## 2015-11-06 DIAGNOSIS — I10 Essential (primary) hypertension: Secondary | ICD-10-CM

## 2015-11-06 HISTORY — DX: Essential (primary) hypertension: I10

## 2015-11-06 MED ORDER — AMLODIPINE BESYLATE 5 MG PO TABS: 5.0000 mg | ORAL_TABLET | Freq: Every day | ORAL | Status: DC

## 2015-11-06 NOTE — Telephone Encounter (Signed)
BP Readings from Last 3 Encounters:  11/05/15 148/90  10/29/15 146/90  10/11/15 133/95   Vitals - 1 value per visit 11/05/2015 10/29/2015 10/11/2015 [id number]  DIASTOLIC 90 90 95 98   Vitals - 1 value per visit 09/11/2015 08/17/2015 07/11/2015 [id number]  DIASTOLIC 75  92 80   Vitals - 1 value per visit 02/08/2015 01/04/2015 11/30/2014 [id number]  DIASTOLIC 94 72 91 86   Vitals - 1 value per visit 08/18/2014 04/27/2014 08/26/2013 [id number]  DIASTOLIC [id number] 98 95 81   Vitals - 1 value per visit 04/14/2013 02/22/2013 02/09/2013 02/06/2013 [id number]  DIASTOLIC 82 94 58  83   Vitals - 1 value per visit 12/26/2012 05/20/2012 09/12/2010 [id number]  DIASTOLIC 93 76 95 90   Vitals - 1 value per visit 12/11/2009 12/06/2009 10/18/2009 09/27/2009 [id number]  DIASTOLIC 84 97 81 [id number] 84   Vitals - 1 value per visit 08/17/2008 08/07/2008 07/31/2008 [id number]  DIASTOLIC 80 77 [id number] 88   Vitals - 1 value per visit 02/15/2008 02/04/2007 01/14/2007 [id number]  DIASTOLIC 88 83 83 94   I spoke with Ms Caruthers about her recurrent elevations in diastolic blood pressure for last couple years.  I recommended starting an antihypertensive medication.  She was willing to start Amlodipine with the goal of reducing her risk of stroke, heart disease and renal disease.  We discussed the common adverse effect of lower extremity edema.  Her husband had edema with Amlodipine and had to be switched to another agent.   A/ Essential Hypertension - No evidence of end organ damage. Normal ECG 07/2015 - Non-hispanic white - No diabetes, though prediabetic - No renal disease. Normal Creatinine 11/2014  Plan P/ - Start Amlodipine 5 mg daily - Monitor for efficacy with home BP cuff.  Goal < 140/90 without adverse effects.  - If home monitoring reaches goal over a month of therapy, then will refill with 90 day supplies and refills of Amlodipine - If home monitoring does not reach goals or adverse effects occur, Ms Sprenkle is to contact our office to  discuss therapy options.

## 2015-11-06 NOTE — Assessment & Plan Note (Signed)
New problem No evidence of end organ damage Start Amlodipine 5 mg daily Home monitoring BP and adverse effects for one month If adequate DBP control (< 90 mmHg) then continue Amlodipine 5 mg daily with increase in Number tab dispensed to 90 day supply. If inadequate DBP control or intolerable ADE, then patient to contact Dr Khi Mcmillen office  to discuss therapy.

## 2015-11-08 ENCOUNTER — Telehealth: Payer: Self-pay | Admitting: Family Medicine

## 2015-11-08 NOTE — Telephone Encounter (Signed)
[facility] After Hours Telephone Line  Number received via page: [phone] Person calling: Izora Ribas Granda Relationship to patient: Self  Reason for call: Blood pressure  Patient states she feels like her heart rate is fast. When she checked her bp and pulse, her bp 143/106 with a pulse of 83. She checked in her other arm, with a reading of 137/98 with a pulse of 84. She reports some chest tightness with no dyspnea after having found out her husband cheated on her. She has started amlodipine 5mg  which is new.  Blood pressure elevated without urgency or emergency. No changes to management. Patient asking about ways for her to calm herself. Discussed prayer, if she engages in that. Also discussed deep breathing exercises and possibly talking to a friend/family member that she trusts. She is opting to try and get some sleep to hopefully help. She will follow-up in office as needed.  Cordelia Poche, MD PGY-3, [facility] 11/08/2015, 10:46 PM

## 2015-11-16 ENCOUNTER — Other Ambulatory Visit: Payer: Self-pay | Admitting: Family Medicine

## 2016-01-31 ENCOUNTER — Ambulatory Visit (INDEPENDENT_AMBULATORY_CARE_PROVIDER_SITE_OTHER): Payer: BLUE CROSS/BLUE SHIELD | Admitting: Family Medicine

## 2016-01-31 ENCOUNTER — Encounter: Payer: Self-pay | Admitting: Family Medicine

## 2016-01-31 VITALS — BP 135/91 | HR 86 | Temp 98.7°F | Ht 64.0 in | Wt 249.4 lb

## 2016-01-31 DIAGNOSIS — J0110 Acute frontal sinusitis, unspecified: Secondary | ICD-10-CM

## 2016-01-31 MED ORDER — FLUTICASONE PROPIONATE 50 MCG/ACT NA SUSP: 2.0000 | Freq: Every day | NASAL | Status: DC

## 2016-01-31 MED ORDER — CETIRIZINE HCL 10 MG PO TABS: 10.0000 mg | ORAL_TABLET | Freq: Every day | ORAL | Status: DC

## 2016-01-31 NOTE — Progress Notes (Signed)
   Subjective:    Patient ID: Carla Berry, female    DOB: Aug 17, 1971, 45 y.o.   MRN: [id number]  Seen for Same day visit for   CC: Sinus congestion  URI  Has been sick for 7 days. Nasal discharge: yes Medications tried: Zyrtec Sick contacts: no  Symptoms Fever: no Headache or face pain: yes; Frontal Tooth pain: no Sneezing: yes Scratchy throat: yes Allergies: yes Muscle aches: no Severe fatigue: no Stiff neck: no Shortness of breath: no Rash: no Sore throat or swollen glands: no  ROS see HPI Smoking Status noted  Objective:  BP 135/91 mmHg  Pulse 86  Temp(Src) 98.7 F (37.1 C)  Ht 5\' 4"  (1.626 m)  Wt 249 lb 6.4 oz (113.127 kg)  BMI 42.79 kg/m2  SpO2 97%  LMP 01/09/2016  General: NAD HEENT: Tenderness over her forehead bilaterally; TMs with middle ear effusion without bulging or erythema; nasal turbinates swollen bilaterally; pharyngeal cobblestoning; no cervical adenopathy Cardiac: RRR, normal heart sounds, no murmurs. Respiratory: CTAB, normal effort     Assessment & Plan:   1. Acute frontal sinusitis, recurrence not specified - fluticasone (FLONASE) 50 MCG/ACT nasal spray; Place 2 sprays into both nostrils daily.  Dispense: 16 g; Refill: 6 - cetirizine (ZYRTEC) 10 MG tablet; Take 1 tablet (10 mg total) by mouth daily.  Dispense: 90 tablet; Refill: 3

## 2016-01-31 NOTE — Patient Instructions (Addendum)
Your symptoms are consistent with sinusitis.  - Start Flonase 1 spray each nostril daily - Continue Zyrtec nightly - You can try Afrin nasal spray for 2-3 days for nasal congestion / sinus pressure. DO NOT use more than 3 days  Sinusitis, Adult Sinusitis is redness, soreness, and puffiness (inflammation) of the air pockets in the bones of your face (sinuses). The redness, soreness, and puffiness can cause air and mucus to get trapped in your sinuses. This can allow germs to grow and cause an infection.  HOME CARE   Drink enough fluids to keep your pee (urine) clear or pale yellow.  Use a humidifier in your home.  Run a hot shower to create steam in the bathroom. Sit in the bathroom with the door closed. Breathe in the steam 3-4 times a day.  Put a warm, moist washcloth on your face 3-4 times a day, or as told by your doctor.  Use salt water sprays (saline sprays) to wet the thick fluid in your nose. This can help the sinuses drain.  Only take medicine as told by your doctor. GET HELP RIGHT AWAY IF:   Your pain gets worse.  You have very bad headaches.  You are sick to your stomach (nauseous).  You throw up (vomit).  You are very sleepy (drowsy) all the time.  Your face is puffy (swollen).  Your vision changes.  You have a stiff neck.  You have trouble breathing. MAKE SURE YOU:   Understand these instructions.  Will watch your condition.  Will get help right away if you are not doing well or get worse.   This information is not intended to replace advice given to you by your health care provider. Make sure you discuss any questions you have with your health care provider.   Document Released: 03/10/2008 Document Revised: 10/13/2014 Document Reviewed: 04/27/2012 Elsevier Interactive Patient Education 2016 [employer]. Sinusitis, Adult Sinusitis is redness, soreness, and puffiness (inflammation) of the air pockets in the bones of your face (sinuses). The redness,  soreness, and puffiness can cause air and mucus to get trapped in your sinuses. This can allow germs to grow and cause an infection.  HOME CARE   Drink enough fluids to keep your pee (urine) clear or pale yellow.  Use a humidifier in your home.  Run a hot shower to create steam in the bathroom. Sit in the bathroom with the door closed. Breathe in the steam 3-4 times a day.  Put a warm, moist washcloth on your face 3-4 times a day, or as told by your doctor.  Use salt water sprays (saline sprays) to wet the thick fluid in your nose. This can help the sinuses drain.  Only take medicine as told by your doctor. GET HELP RIGHT AWAY IF:   Your pain gets worse.  You have very bad headaches.  You are sick to your stomach (nauseous).  You throw up (vomit).  You are very sleepy (drowsy) all the time.  Your face is puffy (swollen).  Your vision changes.  You have a stiff neck.  You have trouble breathing. MAKE SURE YOU:   Understand these instructions.  Will watch your condition.  Will get help right away if you are not doing well or get worse.   This information is not intended to replace advice given to you by your health care provider. Make sure you discuss any questions you have with your health care provider.   Document Released: 03/10/2008 Document Revised: 10/13/2014  Document Reviewed: 04/27/2012 Elsevier Interactive Patient Education [employer].

## 2016-02-03 ENCOUNTER — Other Ambulatory Visit: Payer: Self-pay | Admitting: Family Medicine

## 2016-04-03 ENCOUNTER — Ambulatory Visit (INDEPENDENT_AMBULATORY_CARE_PROVIDER_SITE_OTHER): Payer: BLUE CROSS/BLUE SHIELD | Admitting: Family Medicine

## 2016-04-03 ENCOUNTER — Encounter: Payer: Self-pay | Admitting: Family Medicine

## 2016-04-03 VITALS — BP 140/94 | HR 70 | Temp 97.8°F | Ht 64.0 in | Wt 252.0 lb

## 2016-04-03 DIAGNOSIS — Z87412 Personal history of vulvar dysplasia: Secondary | ICD-10-CM | POA: Diagnosis not present

## 2016-04-03 DIAGNOSIS — M791 Myalgia, unspecified site: Secondary | ICD-10-CM

## 2016-04-03 DIAGNOSIS — N3946 Mixed incontinence: Secondary | ICD-10-CM

## 2016-04-03 DIAGNOSIS — I10 Essential (primary) hypertension: Secondary | ICD-10-CM | POA: Diagnosis not present

## 2016-04-03 DIAGNOSIS — L659 Nonscarring hair loss, unspecified: Secondary | ICD-10-CM | POA: Diagnosis not present

## 2016-04-03 DIAGNOSIS — M67431 Ganglion, right wrist: Secondary | ICD-10-CM

## 2016-04-03 LAB — VITAMIN B12: Vitamin B-12: 519 pg/mL (ref 200–1100)

## 2016-04-03 LAB — POCT SEDIMENTATION RATE: POCT SED RATE: 12 mm/hr (ref 0–22)

## 2016-04-03 LAB — TSH: TSH: 1.68 m[IU]/L

## 2016-04-03 LAB — RHEUMATOID FACTOR: Rhuematoid fact SerPl-aCnc: <10 IU/mL (ref <=14)

## 2016-04-03 MED ORDER — HYDROCHLOROTHIAZIDE 25 MG PO TABS: 12.5000 mg | ORAL_TABLET | Freq: Every day | ORAL | Status: DC

## 2016-04-03 NOTE — Patient Instructions (Signed)
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  The treament is Rogaine for Women which is an over the counter treatment.  I believe the muscle aching is Fibromyalgia.  We will check for thyroid, vitamin deficiencies and rheumatoid arthritis l;abs.  Dr Saed Hudlow will call you if your tests are not good. Otherwise he will send you a letter.  If you sign up for MyChart online, you will be able to see your test results once Dr Lyndal Reggio has reviewed them.  If you do not hear from [place] with in 2 weeks please call our office  Myofascial Pain Syndrome and Fibromyalgia Myofascial pain syndrome and fibromyalgia are both pain disorders. This pain may be felt mainly in your muscles.   Myofascial pain syndrome:  Always has trigger points or tender points in the muscle that will cause pain when pressed. The pain may come and go.  Usually affects your neck, upper back, and shoulder areas. The pain often radiates into your arms and hands.  Fibromyalgia:  Has muscle pains and tenderness that come and go.  Is often associated with fatigue and sleep disturbances.  Has trigger points.  Tends to be long-lasting (chronic), but is not life-threatening. Fibromyalgia and myofascial pain are not the same. However, they often occur together. If you have both conditions, each can make the other worse. Both are common and can cause enough pain and fatigue to make day-to-day activities difficult.  CAUSES  The exact causes of fibromyalgia and myofascial pain are not known. People with certain gene types may be more likely to develop fibromyalgia. Some factors can be triggers for both conditions, such as:   Spine disorders.  Arthritis.  Severe injury (trauma) and other physical stressors.  Being under a lot of stress.  A medical illness. SIGNS AND SYMPTOMS  Fibromyalgia The main symptom of fibromyalgia is widespread pain and tenderness in  your muscles. This can vary over time. Pain is sometimes described as stabbing, shooting, or burning. You may have tingling or numbness, too. You may also have sleep problems and fatigue. You may wake up feeling tired and groggy (fibro fog). Other symptoms may include:   Bowel and bladder problems.  Headaches.  Visual problems.  Problems with odors and noises.  Depression or mood changes.  Painful menstrual periods (dysmenorrhea).  Dry skin or eyes. Myofascial pain syndrome Symptoms of myofascial pain syndrome include:   Tight, ropy bands of muscle.   Uncomfortable sensations in muscular areas, such as:  Aching.  Cramping.  Burning.  Numbness.  Tingling.   Muscle weakness.  Trouble moving certain muscles freely (range of motion). DIAGNOSIS  There are no specific tests to diagnose fibromyalgia or myofascial pain syndrome. Both can be hard to diagnose because their symptoms are common in many other conditions. Your health care provider may suspect one or both of these conditions based on your symptoms and medical history. Your health care provider will also do a physical exam.  The key to diagnosing fibromyalgia is having pain, fatigue, and other symptoms for more than three months that cannot be explained by another condition.  The key to diagnosing myofascial pain syndrome is finding trigger points in muscles that are tender and cause pain elsewhere in your body (referred pain). TREATMENT  Treating fibromyalgia and myofascial pain often requires a team of health care providers. This usually starts with your primary provider and a physical therapist. You may also find it helpful to  work with alternative health care providers, such as massage therapists or acupuncturists. Treatment for fibromyalgia may include medicines. This may include nonsteroidal anti-inflammatory drugs (NSAIDs), along with other medicines.  Treatment for myofascial pain may also  include:  NSAIDs.  Cooling and stretching of muscles.  Trigger point injections.  Sound wave (ultrasound) treatments to stimulate muscles. HOME CARE INSTRUCTIONS   Take medicines only as directed by your health care provider.  Exercise as directed by your health care provider or physical therapist.  Try to avoid stressful situations.  Practice relaxation techniques to control your stress. You may want to try:  Biofeedback.  Visual imagery.  Hypnosis.  Muscle relaxation.  Yoga.  Meditation.  Talk to your health care provider about alternative treatments, such as acupuncture or massage treatment.  Maintain a healthy lifestyle. This includes eating a healthy diet and getting enough sleep.  Consider joining a support group.  Do not do activities that stress or strain your muscles. That includes repetitive motions and heavy lifting. SEEK MEDICAL CARE IF:   You have new symptoms.  Your symptoms get worse.  You have side effects from your medicines.  You have trouble sleeping.  Your condition is causing depression or anxiety. FOR MORE INFORMATION   National Fibromyalgia Association: http://www.fmaware.orgwww.fmaware.[place]: http://www.arthritis.orgwww.arthritis.org  American Chronic Pain Association: StreetWrestling.at.https://stevens.biz/   This information is not intended to replace advice given to you by your health care provider. Make sure you discuss any questions you have with your health care provider.   Document Released: 09/22/2005 Document Revised: 10/13/2014 Document Reviewed: 06/28/2014 Elsevier Interactive Patient Education [employer].

## 2016-04-04 ENCOUNTER — Encounter: Payer: Self-pay | Admitting: Family Medicine

## 2016-04-04 DIAGNOSIS — L659 Nonscarring hair loss, unspecified: Secondary | ICD-10-CM | POA: Insufficient documentation

## 2016-04-04 DIAGNOSIS — M67431 Ganglion, right wrist: Secondary | ICD-10-CM | POA: Insufficient documentation

## 2016-04-04 DIAGNOSIS — M791 Myalgia, unspecified site: Secondary | ICD-10-CM | POA: Insufficient documentation

## 2016-04-04 DIAGNOSIS — N3946 Mixed incontinence: Secondary | ICD-10-CM | POA: Insufficient documentation

## 2016-04-04 HISTORY — DX: Ganglion, right wrist: M67.431

## 2016-04-04 HISTORY — DX: Nonscarring hair loss, unspecified: L65.9

## 2016-04-04 LAB — VITAMIN D 25 HYDROXY (VIT D DEFICIENCY, FRACTURES): Vit D, 25-Hydroxy: 23 ng/mL — ABNORMAL LOW (ref 30–100)

## 2016-04-04 NOTE — Progress Notes (Signed)
Subjective:    Patient ID: Carla Berry, female    DOB: 1971-08-11, 45 y.o.   MRN: [id number]  HPI Problem List Items Addressed This Visit      Medium   Diastolic hypertension (Chronic) CHRONIC HYPERTENSION  Disease Monitoring  Blood pressure range: not checking a thome  Chest pain: no   Dyspnea: no   Claudication: no   Medication compliance: yes  Medication Side Effects  Lightheadedness: no   Urinary frequency: no   Edema: no    Preventitive Healthcare:  Exercise: no   Diet Pattern: working on lowering fat and simple carbohydrate         Unprioritized   Personal history of vulvar dysplasia   Myalgia, Chronic - Primary (Chronic) - present for years - notices more at night in bed, in thoracic back Arms, procximal legs, anterior chest wall - no fever. No joint swellings or redness, no oral lseions, - msleep is problem with DFA.    Relevant Orders   Vitamin B12 (Completed)   VITAMIN D 25 Hydroxy (Vit-D Deficiency, Fractures) (Completed)   Rheumatoid factor (Completed)   POCT SEDIMENTATION RATE (Completed)   Vitamin B1 (Completed)   Hair loss - Duration: 3 months - Management: no new treatments.  No harsh hair treatments - No focal hair loss, notes hair loss when shampooing or brushing hair. No braiding of hair.  Hair not under traction - Still having menstural cysles.  - Severity: Concerning as pt has always been pround of her thick hair and is concerned that this may stop being the case.  - Associated Symptoms: no rashes, no joint swelling or redness or pain.  No oral lesions. No fevers.  No recent unusually intense physical or emotional stressors.    Ganglion of right wrist - Noted in last couple months - occasionally mildly painful/tender.  - no recent enlargement - no overlying skin changes.     Other Visit Diagnoses    Alopecia        Relevant Orders    Vitamin B12 (Completed)    VITAMIN D 25 Hydroxy (Vit-D Deficiency, Fractures) (Completed)    Rheumatoid factor (Completed)    POCT SEDIMENTATION RATE (Completed)    Vitamin B1 (Completed)    TSH (Completed)     Urinary Incontinence - longstanding - Stress and Urge - Pt reports prior work up by Dr Nicki Reaper MacDiarmid (Uro-Urodynamics) who recommeded a procedure for evaluation that would require patient to be sedated for it.  She does not recall what the procedure was for - Pt reports that if she were to remain with the current level of incontinence that the quality of her life would not be significantly impaired.   No smoking.  Distant history of smoking.    Review of Systems See HPI    Objective:   Physical Exam .VS reviewed GEN: Alert, Cooperative, Groomed, NAD HEENT: Normal scalp, firm traction on about 100 hairs resulted in 2 to 3 hairs released without overt bulbs.   COR: RRR, No M/G/R, No JVD, Normal PMI size and location. No obvious pattern of hair loss at sclap vertex or temples. MSK: tender pointes in bilateral trapezius mm  LUNGS: BCTA, No Acc mm use, speaking in full sentences ABDOMEN: (+)BS, soft, NT, ND, No HSM, No palpable masses EXT: No peripheral leg edema. Feet without deformity or lesions. Palpable bilateral pedal pulses.  SKIN: No lesion nor rashes of face/trunk/extremities Neuro: Oriented to person, place, and time; Strength: 5/5 Bil. UE and LE symmetric;  Sensation: Intact grossly to touch all four extremities; Cerebellar: Finger-to-Nose intact, Rhomberg negative DTR: Bilateral Bicep 2+, Bilateral Triceps 2+, Bilateral Knees 2+, Bilateral Ankles 1+ Gait: Normal speed, No significant path deviation, Step through +,  Psych: Normal affect/thought/speech/language        Assessment & Plan:

## 2016-04-04 NOTE — Assessment & Plan Note (Signed)
Wt Readings from Last 3 Encounters:  04/03/16 252 lb (114.306 kg)  01/31/16 249 lb 6.4 oz (113.127 kg)  10/11/15 251 lb 11.2 oz (114.17 kg)  Established problem. Stable. Pt planning on starting to walk with dgt Family working on reducing fat and simple carbohydrates in meals and snacks. Gave Dr Jenne Campus card to discuss healthy eating.

## 2016-04-04 NOTE — Assessment & Plan Note (Signed)
New problem Non-focal, non-scarring hair loss No clear female-pattern baldness on gross exam today Normal Vit B12 and TSH. 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 Pt can use Rogaine for Women (OTC)

## 2016-04-04 NOTE — Assessment & Plan Note (Signed)
Patient is due for colposcopic surveillance for recurrence Pt said she would schedule appointment in [facility].

## 2016-04-04 NOTE — Assessment & Plan Note (Signed)
Established problem Controlled Pt reports prior work up with Urology (Dr Nicki Reaper Macdiarmid) but has not been back to see him. I can find no record of urology consultation.  Currently, stage of incontinence is insufficient to inpair her quality of life to degree she wishes to return to see Dr Matilde Sprang.

## 2016-04-04 NOTE — Assessment & Plan Note (Signed)
New problem Stable Not cosmetically or functionally important currently Monitor

## 2016-04-04 NOTE — Assessment & Plan Note (Signed)
Established problem Uncontrollled Will add amlodipine 5 mg daily with continuation of HCTZ Will monitor for peripheral edema.  Discussed role of weight loss.

## 2016-04-04 NOTE — Assessment & Plan Note (Signed)
New complaint Suspect that patient meets critieria for Fibromyalgia Poor sleep habits since school is wout and no structure to days.  Gave information about FM and important role of sleep and exercise in its treatment.  Discussed use of professional massage as one component of the treatment of FM

## 2016-04-07 ENCOUNTER — Telehealth: Payer: Self-pay | Admitting: Family Medicine

## 2016-04-07 LAB — VITAMIN B1: VITAMIN B1 (THIAMINE): 7 nmol/L — AB (ref 8–30)

## 2016-04-07 NOTE — Telephone Encounter (Signed)
I spoke with Ms Iida about the possibility of Thiamine deficiency.  She has no clear risk factors for B1 deficiency e.g. Malnourishment, inadequate variety of foods in diet, alcohol abuse. No peripheral neuropathy symptoms and no cardiac symptoms.  Recommendation Start Thiamine (B1) 100 mg tablet (OTC) one a day Will recheck B1 level next visit in August.

## 2016-04-10 ENCOUNTER — Ambulatory Visit (INDEPENDENT_AMBULATORY_CARE_PROVIDER_SITE_OTHER): Payer: BLUE CROSS/BLUE SHIELD | Admitting: Family Medicine

## 2016-04-10 ENCOUNTER — Other Ambulatory Visit (HOSPITAL_COMMUNITY)
Admission: RE | Admit: 2016-04-10 | Discharge: 2016-04-10 | Disposition: A | Discharge status: Home / Self Care | Payer: BLUE CROSS/BLUE SHIELD | Source: Ambulatory Visit | Attending: Family Medicine | Admitting: Family Medicine

## 2016-04-10 VITALS — BP 131/92 | HR 88 | Temp 98.1°F | Ht 64.0 in | Wt 250.4 lb

## 2016-04-10 DIAGNOSIS — Z124 Encounter for screening for malignant neoplasm of cervix: Secondary | ICD-10-CM

## 2016-04-10 DIAGNOSIS — Z87412 Personal history of vulvar dysplasia: Secondary | ICD-10-CM | POA: Diagnosis not present

## 2016-04-10 DIAGNOSIS — Z113 Encounter for screening for infections with a predominantly sexual mode of transmission: Secondary | ICD-10-CM

## 2016-04-10 DIAGNOSIS — Z01419 Encounter for gynecological examination (general) (routine) without abnormal findings: Secondary | ICD-10-CM | POA: Insufficient documentation

## 2016-04-10 NOTE — Progress Notes (Signed)
Patient ID: Carla Berry, female   DOB: Nov 20, 1970, 45 y.o.   MRN: [id number] Patient here for annual exam. 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 She's having some pelvic pressure sensation pretty much all day. Not worse with timing of the day. Not having any dyspareunia. Menstrual cycles have continued be regular. The last 2 cycles before this one were associated with more blood flow and increased cramping. This one has been more normal. She is status post BTL. Denies having any itching or pain in the vulvar area. Patient given informed consent, signed copy in the chart.  Placed in lithotomy position. Careful or hand. Alphonzo Devera area examined using colposcope, with and without acetic acid, with without Lugol solution. Acetowhite lesions?No Punctation?No Mosaicism?  No Abnormal vasculature?  No Biopsies?No I did observe some well-healed scars in the perineal area from previous previous lobectomy. Is well-healed. No sign of any external abnormalities. Bimanual exam was normal although limited by habitus. No adnexal masses or tenderness. Uterus appears normal in size and position. Pap smear obtained. Cervix appears normal there is some mild thin white discharge. No lesions noted. Complications? None  Patient decided STI testing which will be performed on her Pap smear. COMMENTS: Patient was given post procedure instructions.  I will notify her of  pathology results. Recommend repeat colposcopic dated Pap smear in vulvar examination in one year.

## 2016-04-10 NOTE — Addendum Note (Signed)
Addended by: Derl Barrow on: 04/10/2016 12:14 PM   Modules accepted: Orders, SmartSet

## 2016-04-11 LAB — CERVICOVAGINAL ANCILLARY ONLY
CHLAMYDIA, DNA PROBE: POSITIVE — AB
Neisseria Gonorrhea: NEGATIVE

## 2016-04-11 LAB — CYTOLOGY - PAP

## 2016-04-15 ENCOUNTER — Other Ambulatory Visit: Payer: Self-pay | Admitting: Family Medicine

## 2016-04-15 ENCOUNTER — Telehealth: Payer: Self-pay | Admitting: *Deleted

## 2016-04-15 ENCOUNTER — Encounter: Payer: Self-pay | Admitting: Family Medicine

## 2016-04-15 MED ORDER — AZITHROMYCIN 250 MG PO TABS: ORAL_TABLET | ORAL | Status: DC

## 2016-04-15 NOTE — Progress Notes (Signed)
Spoke with her regarding positive chlamydia. I originally wrote a letter but then I was able to get hold of her by phone so I discarded letter. Her pap is normal. I am sending a rx in for her and her husband Carla Berry DOB 04/29/1977) for treatment if chlamydia with azithromycin. They are to take treatment simultaneously. i recommend she come for test of cure at her f/u appt with Dr. McDiarmid in a fe weeks.

## 2016-04-15 NOTE — Telephone Encounter (Signed)
Lab form faxed to health department. [name], Tilak Oakley D, [place]

## 2016-05-12 ENCOUNTER — Other Ambulatory Visit: Payer: Self-pay | Admitting: Family Medicine

## 2016-05-14 NOTE — Telephone Encounter (Signed)
2nd request.  Martin, Tamika L, RN  

## 2016-05-15 ENCOUNTER — Encounter: Payer: Self-pay | Admitting: Family Medicine

## 2016-05-15 ENCOUNTER — Other Ambulatory Visit (HOSPITAL_COMMUNITY)
Admission: RE | Admit: 2016-05-15 | Discharge: 2016-05-15 | Disposition: A | Discharge status: Home / Self Care | Payer: BLUE CROSS/BLUE SHIELD | Source: Ambulatory Visit | Attending: Family Medicine | Admitting: Family Medicine

## 2016-05-15 ENCOUNTER — Ambulatory Visit (INDEPENDENT_AMBULATORY_CARE_PROVIDER_SITE_OTHER): Payer: BLUE CROSS/BLUE SHIELD | Admitting: Family Medicine

## 2016-05-15 VITALS — BP 120/92 | HR 73 | Temp 98.0°F | Wt 248.0 lb

## 2016-05-15 DIAGNOSIS — I10 Essential (primary) hypertension: Secondary | ICD-10-CM | POA: Diagnosis not present

## 2016-05-15 DIAGNOSIS — Z113 Encounter for screening for infections with a predominantly sexual mode of transmission: Secondary | ICD-10-CM | POA: Insufficient documentation

## 2016-05-15 DIAGNOSIS — E519 Thiamine deficiency, unspecified: Secondary | ICD-10-CM | POA: Diagnosis not present

## 2016-05-15 DIAGNOSIS — Z7251 High risk heterosexual behavior: Secondary | ICD-10-CM

## 2016-05-15 DIAGNOSIS — N3945 Continuous leakage: Secondary | ICD-10-CM

## 2016-05-15 DIAGNOSIS — Z630 Problems in relationship with spouse or partner: Secondary | ICD-10-CM

## 2016-05-15 MED ORDER — AMLODIPINE BESYLATE 10 MG PO TABS: 10.0000 mg | ORAL_TABLET | Freq: Every day | ORAL | 3 refills | Status: DC

## 2016-05-15 NOTE — Patient Instructions (Signed)
Increase the Amlodipine blood pressure medicine to 10 mg daily 9 one tablet daily) to improve your blood pressure control.  If you have problems with the Urology referral, please let our office know.  We may be able to help.  Dr Shantice Menger will call you if your tests results. Otherwise he will send you a letter.  If you sign up for MyChart online, you will be able to see your test results once Dr Shahzad Thomann has reviewed them.  If you do not hear from [place] with in 2 weeks please call our office.
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
## 2016-05-15 NOTE — Progress Notes (Signed)
Subjective:  Hypertension  Patient here for follow-up of elevated blood pressure.  She is not exercising and is not adherent to a low-salt diet.  Blood pressure is not well controlled at home. Cardiac symptoms: none. Patient denies: chest pain, claudication, dyspnea, orthopnea, palpitations, syncope and leg edema. Cardiovascular risk factors: hypertension and sedentary lifestyle. Use of agents associated with hypertension: none. History of target organ damage: none. (+) compliant with HCTZ and Amlodipine  Chlamydia Infection - Diagnosed 7/6 during Pap/Colpo at [facility]. - Tx'd 2 gram Azithromycin PO- 7/11.   - Pt reports her husband was treated with Azithromycin as well  - No sexual intercourse since treatment.  - Denies pelvic pain, vaginal discharge  Urinary Incontinence Onset: years ago Frequency: daily Nocturia: yes Volume loss: constant trickle Peripheral edema: no  Urine loss with motion(cough/laugh/sneeze/lift/stand): yes  Strong urge to void and cannot get to toilet fast enough: no Most bothersome consequence of incontinence: constant wetness Impact on Quality of Life: decrease quality   PMH: History (no CHF, no peripheral edema, no Constipation, no diabetes, no mobility impairment, no cognitive impairment, no CVA, No Spinal stenosis, No OSA, No Pulmonary disease with cough)  Medications worsening incontinence: Amlodipine  Prior testing for incontinence: Pt reports seeing Dr Amalia Hailey (Urology) over 10 years ago. 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   Hair Loss - Low thiamine level on last ov -  Pt taking thiamine 100 mg daily now The following portions of the patient's history were reviewed and updated as appropriate: allergies, current medications, past family history, past medical history, past social history, past surgical history and problem list.  Review of Systems Constitutional: negative for fevers and weight loss Respiratory: negative for cough and dyspnea on exertion Cardiovascular: negative for chest pain and syncope Gastrointestinal: negative for constipation and diarrhea Genitourinary:positive for urinary incontinence, negative for dysuria, frequency and nocturia     Objective:    BP (!) 120/92   Pulse 73   Temp 98 F (36.7 C) (Oral)   Wt 248 lb (112.5 kg)   LMP 04/21/2016   SpO2 96%   BMI 42.57 kg/m   General Appearance:    Alert, cooperative, no distress, appears stated age  Head:    Normocephalic, without obvious abnormality, atraumatic        Nose:   Nares normal, septum midline, mucosa normal, no drainage    or sinus tenderness  Throat:   Lips, mucosa, and tongue normal; teeth and gums normal  Neck:   Supple, symmetrical, trachea midline, no adenopathy;    thyroid:  no enlargement/tenderness/nodules; no carotid   bruit or JVD  Back:     Symmetric, no curvature, ROM normal, no CVA tenderness  Lungs:     Clear to auscultation bilaterally, respirations unlabored  Chest Wall:    No tenderness or deformity   Heart:    Regular rate and rhythm, S1 and S2 normal, no murmur, rub   or gallop     Abdomen:  Soft, non-tender, bowel sounds active all four quadrants,    no masses, no organomegaly        Extremities:   Extremities normal, atraumatic, no cyanosis or edema  Pulses:   2+ and symmetric all extremities  Skin:   Skin color, texture, turgor normal, no rashes or lesions             Assessment:     Hypertension, stage 1 . Evidence of target organ damage: none.    Plan:    Medication: continue HCTZ 12.5 mg daily and increase to Amlodipine 10 mg daily.     Urinary Incontinence - Stress incontinence by history, chronic - Referral to Urology for evaluation.  Pt has seen Dr Amalia Hailey (Uro) in past.   Chlamydia cervicitis - Treated one month ago. - Test of Cure today with Urine Chlamydia - check HIV serology today  Low thiamine - On Thiamine 100 mg daily - Check level today.

## 2016-05-16 ENCOUNTER — Encounter: Payer: Self-pay | Admitting: Family Medicine

## 2016-05-16 LAB — URINE CYTOLOGY ANCILLARY ONLY
Chlamydia: NEGATIVE
Neisseria Gonorrhea: NEGATIVE

## 2016-05-16 LAB — HIV ANTIBODY (ROUTINE TESTING W REFLEX): HIV 1&2 Ab, 4th Generation: NONREACTIVE

## 2016-05-20 ENCOUNTER — Telehealth: Payer: Self-pay | Admitting: Family Medicine

## 2016-05-20 NOTE — Telephone Encounter (Signed)
Informed patient of negative GC/Chlamydia test and HIV screen. No questions.  Thiamine level pending

## 2016-05-23 ENCOUNTER — Encounter: Payer: Self-pay | Admitting: Family Medicine

## 2016-05-23 LAB — VITAMIN B1, WHOLE BLOOD: VITAMIN B1 (THIAMINE), BLOOD: 238 nmol/L — AB (ref 78–185)

## 2016-05-27 ENCOUNTER — Ambulatory Visit (INDEPENDENT_AMBULATORY_CARE_PROVIDER_SITE_OTHER): Payer: BLUE CROSS/BLUE SHIELD | Admitting: Licensed Clinical Social Worker

## 2016-05-27 DIAGNOSIS — F439 Reaction to severe stress, unspecified: Secondary | ICD-10-CM

## 2016-05-27 DIAGNOSIS — Z638 Other specified problems related to primary support group: Secondary | ICD-10-CM

## 2016-05-27 NOTE — Progress Notes (Signed)
Patient ID: Carla Berry, female   DOB: [birth year], 45 y.o.   MRN: [id number] Reason for follow-up:  intervention to address stressors and depression. PHQ scores have decrease to 4 since last visit. With indication of min. Depression, however patient was tearful at time and admits to often being tearful.  Depression screen [facility] 2/9 05/27/2016 05/15/2016 05/15/2016  Decreased Interest 1 1 0  Down, Depressed, Hopeless 0 3 0  PHQ - 2 Score 1 4 0  Altered sleeping 1 2 -  Tired, decreased energy 1 2 -  Change in appetite 1 2 -  Feeling bad or failure about yourself  0 3 -  Trouble concentrating 0 1 -  Moving slowly or fidgety/restless 0 0 -  Suicidal thoughts 0 1 -  PHQ-9 Score 4 15 -  Difficult doing work/chores Not difficult at all Not difficult at all -  Patient utilized relaxation techniques this week to avoid relationship conflicts with husband and other family members visiting. Interventions utilized today were Motivational Interviewing and Solution focus. Patient's goals were reviewed, no adjustments made.   The following issues were discussed: what does self- care look like, identify pleasurable activities, Depression self-care work sheet and discussed needs of long-term counseling to meet patient's ongoing needs. Patient admits that she needs help but thinks she needs more intense therapy. Patient would benefit from intense counseling to address long-term issues and concerns that has happened over the past 41 years.  Will continue to see patient until she is connected to a behavioral health provider that is able to assist her with long-term counseling.    Plan: Patient is in agreement to work on pleasurable activity and self-care which includes reading and watching movies, call agencies on list to locate a counselor for long-term therapy, and call to schedule an appointment with INT in two weeks if she is unable to schedule an appointment with a new provider.    Casimer Lanius, LCSW Licensed  Clinical Social Worker [place]   [phone] 3:12 PM

## 2016-06-05 ENCOUNTER — Telehealth: Payer: Self-pay | Admitting: Family Medicine

## 2016-06-05 NOTE — Telephone Encounter (Signed)
Letter sent.

## 2016-06-05 NOTE — Progress Notes (Signed)
Letter to Ms Bonfiglio about thiamine blood levels on thiamine supplement oral.

## 2016-07-03 ENCOUNTER — Ambulatory Visit (INDEPENDENT_AMBULATORY_CARE_PROVIDER_SITE_OTHER): Payer: BLUE CROSS/BLUE SHIELD | Admitting: Family Medicine

## 2016-07-03 VITALS — BP 127/87 | HR 81 | Temp 98.3°F | Ht 64.0 in | Wt 246.0 lb

## 2016-07-03 DIAGNOSIS — I10 Essential (primary) hypertension: Secondary | ICD-10-CM | POA: Diagnosis not present

## 2016-07-03 DIAGNOSIS — Z79899 Other long term (current) drug therapy: Secondary | ICD-10-CM

## 2016-07-03 LAB — BASIC METABOLIC PANEL
BUN: 12 mg/dL (ref 7–25)
CHLORIDE: 103 mmol/L (ref 98–110)
CO2: 25 mmol/L (ref 20–31)
CREATININE: 0.73 mg/dL (ref 0.50–1.10)
Calcium: 8.7 mg/dL (ref 8.6–10.2)
Glucose, Bld: 88 mg/dL (ref 65–99)
POTASSIUM: 3.3 mmol/L — AB (ref 3.5–5.3)
Sodium: 138 mmol/L (ref 135–146)

## 2016-07-03 MED ORDER — AMLODIPINE BESYLATE 10 MG PO TABS: 10.0000 mg | ORAL_TABLET | Freq: Every day | ORAL | 3 refills | Status: DC

## 2016-07-03 MED ORDER — HYDROCHLOROTHIAZIDE 25 MG PO TABS: 12.5000 mg | ORAL_TABLET | Freq: Every day | ORAL | 3 refills | Status: DC

## 2016-07-03 NOTE — Patient Instructions (Signed)
Continue the Amlodpine and HCTZ half tablet  Consider investing in a couple pairs of Over-theCounter Graduated Compression Stocking with 15 to 20 millimeters Mercury compression. These type of stockings are tighter at the ankles then gradually become less tight as they go up the leg.  They are designed to help squeeze the fluid out of the legs and ankles back into the circulation where the fluid belongs.   If you shop around, either on the Online or at [place], you should be able to purchase a pair for about $20.  You do not need to have special fittings for this level of stocking compression, you cmay buy them straight off the store shelf or Online.   The graduated compression stockings come in a variety of style and colors now, so fashion should not get in the way of wearing them.  Knee high compression stockings would be the right length for you.  This degree of compression is not to difficult to pull on and off.  They are suprisingly comfortable.    I have had the most experience with the Graduated Compression Stockings made by the [employer] called [place], but other manufacturers of compression stockings will likely also provide a stocking that compresses and is comfortable.

## 2016-07-04 ENCOUNTER — Other Ambulatory Visit: Payer: Self-pay | Admitting: *Deleted

## 2016-07-04 DIAGNOSIS — Z79899 Other long term (current) drug therapy: Secondary | ICD-10-CM

## 2016-07-04 NOTE — Assessment & Plan Note (Addendum)
Adequate blood pressure control.  No evidence of new end organ damage.  Tolerating Amlodipine 10 mg daily and HCTZ 12.5 mg daily without significant adverse effects.  Plan to continue current blood pressure regiment.  Weight loss continues to be a source of nonpharmacologic interest for her BP.  Pt knows that consultation with Dr Jenne Campus (Nutrition) is available.

## 2016-07-04 NOTE — Progress Notes (Signed)
   Subjective:    Patient ID: Carla Berry, female    DOB: 1971-08-17, 45 y.o.   MRN: [id number] Carla Berry is alone Sources of clinical information for visit is/are patient and past medical records. Nursing assessment for this office visit was reviewed with the patient for accuracy and revision.   HPI CHRONIC HYPERTENSION  Disease Monitoring  Blood pressure range: checked at a drug store with SBP 130 and DBP 94 once time  Chest pain: no   Dyspnea: no   Claudication: no   Medication compliance: yes, Amlodipine 10 mg daily and  HCTZ 12.5 mg daily  Medication Side Effects  Lightheadedness: no   Urinary frequency: no   Edema: no   SH: No smoking    Review of Systems No headache No orthopnea No wheezing    Objective:   Physical Exam VS reviewed GEN: Alert, Cooperative, Groomed, NAD COR: RRR, No M/G/R, No JVD, Normal PMI size and location LUNGS: BCTA, No Acc mm use, speaking in full sentences  EXT: No peripheral leg edema. Gait: Normal speed, No significant path deviation, Step through +,  Psych: Normal affect/thought/speech/language        Assessment & Plan:

## 2016-07-08 NOTE — Telephone Encounter (Signed)
Refilled 07/03/16. Karmyn Lowman, Salome Spotted, CMA

## 2016-07-11 ENCOUNTER — Encounter: Payer: Self-pay | Admitting: Family Medicine

## 2016-07-11 DIAGNOSIS — E876 Hypokalemia: Secondary | ICD-10-CM

## 2016-07-15 DIAGNOSIS — R351 Nocturia: Secondary | ICD-10-CM | POA: Diagnosis not present

## 2016-07-15 DIAGNOSIS — N3946 Mixed incontinence: Secondary | ICD-10-CM | POA: Diagnosis not present

## 2016-07-15 DIAGNOSIS — R350 Frequency of micturition: Secondary | ICD-10-CM | POA: Diagnosis not present

## 2016-07-15 DIAGNOSIS — N3942 Incontinence without sensory awareness: Secondary | ICD-10-CM | POA: Diagnosis not present

## 2016-08-11 ENCOUNTER — Ambulatory Visit: Payer: BLUE CROSS/BLUE SHIELD

## 2016-08-12 ENCOUNTER — Other Ambulatory Visit: Payer: BLUE CROSS/BLUE SHIELD

## 2016-08-12 DIAGNOSIS — E876 Hypokalemia: Secondary | ICD-10-CM | POA: Diagnosis not present

## 2016-08-13 ENCOUNTER — Encounter: Payer: Self-pay | Admitting: Family Medicine

## 2016-08-13 LAB — MAGNESIUM: MAGNESIUM: 1.7 mg/dL (ref 1.5–2.5)

## 2016-08-13 LAB — BASIC METABOLIC PANEL
BUN: 11 mg/dL (ref 7–25)
CHLORIDE: 100 mmol/L (ref 98–110)
CO2: 28 mmol/L (ref 20–31)
Calcium: 8.8 mg/dL (ref 8.6–10.2)
Creat: 0.8 mg/dL (ref 0.50–1.10)
Glucose, Bld: 85 mg/dL (ref 65–99)
POTASSIUM: 3.8 mmol/L (ref 3.5–5.3)
SODIUM: 138 mmol/L (ref 135–146)

## 2016-08-15 DIAGNOSIS — N3946 Mixed incontinence: Secondary | ICD-10-CM | POA: Diagnosis not present

## 2016-08-15 DIAGNOSIS — N3942 Incontinence without sensory awareness: Secondary | ICD-10-CM | POA: Diagnosis not present

## 2016-08-15 DIAGNOSIS — R350 Frequency of micturition: Secondary | ICD-10-CM | POA: Diagnosis not present

## 2016-08-18 ENCOUNTER — Encounter: Payer: Self-pay | Admitting: Family Medicine

## 2016-08-18 NOTE — Progress Notes (Unsigned)
07/15/16 office visit with Carla Loser, MD ([facility]) for nocturia and urinary incontinence with mixed stress & urgency. 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

## 2016-08-27 DIAGNOSIS — N3946 Mixed incontinence: Secondary | ICD-10-CM | POA: Diagnosis not present

## 2016-08-27 DIAGNOSIS — N3944 Nocturnal enuresis: Secondary | ICD-10-CM | POA: Diagnosis not present

## 2016-09-06 DIAGNOSIS — Z23 Encounter for immunization: Secondary | ICD-10-CM | POA: Diagnosis not present

## 2016-10-07 DIAGNOSIS — J069 Acute upper respiratory infection, unspecified: Secondary | ICD-10-CM | POA: Diagnosis not present

## 2017-02-02 ENCOUNTER — Other Ambulatory Visit: Payer: Self-pay | Admitting: Family Medicine

## 2017-02-04 ENCOUNTER — Other Ambulatory Visit: Payer: Self-pay | Admitting: *Deleted

## 2017-02-04 DIAGNOSIS — J0110 Acute frontal sinusitis, unspecified: Secondary | ICD-10-CM

## 2017-02-04 MED ORDER — CETIRIZINE HCL 10 MG PO TABS: 10.0000 mg | ORAL_TABLET | Freq: Every day | ORAL | 3 refills | Status: DC

## 2017-03-06 ENCOUNTER — Encounter: Payer: Self-pay | Admitting: Family Medicine

## 2017-03-16 ENCOUNTER — Other Ambulatory Visit: Payer: Self-pay | Admitting: Family Medicine

## 2017-03-16 DIAGNOSIS — Z8619 Personal history of other infectious and parasitic diseases: Secondary | ICD-10-CM

## 2017-03-16 DIAGNOSIS — Z79899 Other long term (current) drug therapy: Secondary | ICD-10-CM

## 2017-03-16 NOTE — Progress Notes (Unsigned)
Previsit labs CMET, CBC with Diff, urine Chlamydia/GC probe

## 2017-03-19 ENCOUNTER — Other Ambulatory Visit: Payer: BLUE CROSS/BLUE SHIELD

## 2017-03-19 ENCOUNTER — Other Ambulatory Visit (HOSPITAL_COMMUNITY)
Admission: RE | Admit: 2017-03-19 | Discharge: 2017-03-19 | Disposition: A | Discharge status: Home / Self Care | Payer: BLUE CROSS/BLUE SHIELD | Source: Ambulatory Visit | Attending: Family Medicine | Admitting: Family Medicine

## 2017-03-19 DIAGNOSIS — Z79899 Other long term (current) drug therapy: Secondary | ICD-10-CM

## 2017-03-19 DIAGNOSIS — Z8619 Personal history of other infectious and parasitic diseases: Secondary | ICD-10-CM | POA: Insufficient documentation

## 2017-03-20 LAB — CBC WITH DIFFERENTIAL/PLATELET
Basophils Absolute: 0 10*3/uL (ref 0.0–0.2)
Basos: 0 %
EOS (ABSOLUTE): 0.1 10*3/uL (ref 0.0–0.4)
Eos: 1 %
Hematocrit: 41.5 % (ref 34.0–46.6)
Hemoglobin: 13.4 g/dL (ref 11.1–15.9)
Immature Grans (Abs): 0 10*3/uL (ref 0.0–0.1)
Immature Granulocytes: 0 %
LYMPHS: 26 %
Lymphocytes Absolute: 2.4 10*3/uL (ref 0.7–3.1)
MCH: 26.3 pg — ABNORMAL LOW (ref 26.6–33.0)
MCHC: 32.3 g/dL (ref 31.5–35.7)
MCV: 82 fL (ref 79–97)
MONOCYTES: 7 %
MONOS ABS: 0.7 10*3/uL (ref 0.1–0.9)
Neutrophils Absolute: 5.9 10*3/uL (ref 1.4–7.0)
Neutrophils: 66 %
PLATELETS: 376 10*3/uL (ref 150–379)
RBC: 5.09 x10E6/uL (ref 3.77–5.28)
RDW: 15.3 % (ref 12.3–15.4)
WBC: 9.1 10*3/uL (ref 3.4–10.8)

## 2017-03-20 LAB — CMP14+EGFR
ALBUMIN: 3.8 g/dL (ref 3.5–5.5)
ALT: 16 IU/L (ref 0–32)
AST: 31 IU/L (ref 0–40)
Albumin/Globulin Ratio: 1.2 (ref 1.2–2.2)
Alkaline Phosphatase: 117 IU/L (ref 39–117)
BILIRUBIN TOTAL: 0.5 mg/dL (ref 0.0–1.2)
BUN / CREAT RATIO: 12 (ref 9–23)
BUN: 11 mg/dL (ref 6–24)
CALCIUM: 9 mg/dL (ref 8.7–10.2)
CHLORIDE: 99 mmol/L (ref 96–106)
CO2: 23 mmol/L (ref 20–29)
Creatinine, Ser: 0.89 mg/dL (ref 0.57–1.00)
GFR, EST AFRICAN AMERICAN: 92 mL/min/{1.73_m2} (ref >59)
GFR, EST NON AFRICAN AMERICAN: 80 mL/min/{1.73_m2} (ref >59)
GLUCOSE: 87 mg/dL (ref 65–99)
Globulin, Total: 3.1 g/dL (ref 1.5–4.5)
Potassium: 4.5 mmol/L (ref 3.5–5.2)
Sodium: 139 mmol/L (ref 134–144)
TOTAL PROTEIN: 6.9 g/dL (ref 6.0–8.5)

## 2017-03-20 LAB — URINE CYTOLOGY ANCILLARY ONLY
Chlamydia: NEGATIVE
Neisseria Gonorrhea: NEGATIVE

## 2017-04-02 ENCOUNTER — Ambulatory Visit (INDEPENDENT_AMBULATORY_CARE_PROVIDER_SITE_OTHER): Payer: BLUE CROSS/BLUE SHIELD | Admitting: Family Medicine

## 2017-04-02 ENCOUNTER — Encounter: Payer: Self-pay | Admitting: Family Medicine

## 2017-04-02 VITALS — BP 138/84 | HR 63 | Temp 98.1°F | Ht 64.0 in | Wt 245.8 lb

## 2017-04-02 DIAGNOSIS — M791 Myalgia, unspecified site: Secondary | ICD-10-CM

## 2017-04-02 DIAGNOSIS — Z79899 Other long term (current) drug therapy: Secondary | ICD-10-CM | POA: Diagnosis not present

## 2017-04-02 DIAGNOSIS — Z6841 Body Mass Index (BMI) 40.0 and over, adult: Secondary | ICD-10-CM

## 2017-04-02 DIAGNOSIS — L97929 Non-pressure chronic ulcer of unspecified part of left lower leg with unspecified severity: Secondary | ICD-10-CM

## 2017-04-02 DIAGNOSIS — R0683 Snoring: Secondary | ICD-10-CM

## 2017-04-02 DIAGNOSIS — I10 Essential (primary) hypertension: Secondary | ICD-10-CM

## 2017-04-02 DIAGNOSIS — I8393 Asymptomatic varicose veins of bilateral lower extremities: Secondary | ICD-10-CM | POA: Diagnosis not present

## 2017-04-02 DIAGNOSIS — E7439 Other disorders of intestinal carbohydrate absorption: Secondary | ICD-10-CM

## 2017-04-02 DIAGNOSIS — I83029 Varicose veins of left lower extremity with ulcer of unspecified site: Secondary | ICD-10-CM

## 2017-04-02 DIAGNOSIS — IMO0001 Reserved for inherently not codable concepts without codable children: Secondary | ICD-10-CM

## 2017-04-02 DIAGNOSIS — E669 Obesity, unspecified: Secondary | ICD-10-CM | POA: Diagnosis not present

## 2017-04-02 LAB — POCT GLYCOSYLATED HEMOGLOBIN (HGB A1C): Hemoglobin A1C: 5.8

## 2017-04-02 MED ORDER — PREGABALIN 50 MG PO CAPS: 50.0000 mg | ORAL_CAPSULE | Freq: Three times a day (TID) | ORAL | 0 refills | Status: DC

## 2017-04-02 NOTE — Patient Instructions (Signed)
Myofascial Pain Syndrome and Fibromyalgia Myofascial pain syndrome and fibromyalgia are both pain disorders. This pain may be felt mainly in your muscles.  Myofascial pain syndrome: ? Always has trigger points or tender points in the muscle that will cause pain when pressed. The pain may come and go. ? Usually affects your neck, upper back, and shoulder areas. The pain often radiates into your arms and hands.  Fibromyalgia: ? Has muscle pains and tenderness that come and go. ? Is often associated with fatigue and sleep disturbances. ? Has trigger points. ? Tends to be long-lasting (chronic), but is not life-threatening.  Fibromyalgia and myofascial pain are not the same. However, they often occur together. If you have both conditions, each can make the other worse. Both are common and can cause enough pain and fatigue to make day-to-day activities difficult. What are the causes? The exact causes of fibromyalgia and myofascial pain are not known. People with certain gene types may be more likely to develop fibromyalgia. Some factors can be triggers for both conditions, such as:  Spine disorders.  Arthritis.  Severe injury (trauma) and other physical stressors.  Being under a lot of stress.  A medical illness.  What are the signs or symptoms? Fibromyalgia The main symptom of fibromyalgia is widespread pain and tenderness in your muscles. This can vary over time. Pain is sometimes described as stabbing, shooting, or burning. You may have tingling or numbness, too. You may also have sleep problems and fatigue. You may wake up feeling tired and groggy (fibro fog). Other symptoms may include:  Bowel and bladder problems.  Headaches.  Visual problems.  Problems with odors and noises.  Depression or mood changes.  Painful menstrual periods (dysmenorrhea).  Dry skin or eyes.  Myofascial pain syndrome Symptoms of myofascial pain syndrome include:  Tight, ropy bands of  muscle.  Uncomfortable sensations in muscular areas, such as: ? Aching. ? Cramping. ? Burning. ? Numbness. ? Tingling. ? Muscle weakness.  Trouble moving certain muscles freely (range of motion).  How is this diagnosed? There are no specific tests to diagnose fibromyalgia or myofascial pain syndrome. Both can be hard to diagnose because their symptoms are common in many other conditions. Your health care provider may suspect one or both of these conditions based on your symptoms and medical history. Your health care provider will also do a physical exam. The key to diagnosing fibromyalgia is having pain, fatigue, and other symptoms for more than three months that cannot be explained by another condition. The key to diagnosing myofascial pain syndrome is finding trigger points in muscles that are tender and cause pain elsewhere in your body (referred pain). How is this treated? Treating fibromyalgia and myofascial pain often requires a team of health care providers. This usually starts with your primary provider and a physical therapist. You may also find it helpful to work with alternative health care providers, such as massage therapists or acupuncturists. Treatment for fibromyalgia may include medicines. This may include nonsteroidal anti-inflammatory drugs (NSAIDs), along with other medicines. Treatment for myofascial pain may also include:  NSAIDs.  Cooling and stretching of muscles.  Trigger point injections.  Sound wave (ultrasound) treatments to stimulate muscles.  Follow these instructions at home:  Take medicines only as directed by your health care provider.  Exercise as directed by your health care provider or physical therapist.  Try to avoid stressful situations.  Practice relaxation techniques to control your stress. You may want to try: ? Biofeedback. ? Visual   imagery. ? Hypnosis. ? Muscle relaxation. ? Yoga. ? Meditation.  Talk to your health care provider  about alternative treatments, such as acupuncture or massage treatment.  Maintain a healthy lifestyle. This includes eating a healthy diet and getting enough sleep.  Consider joining a support group.  Do not do activities that stress or strain your muscles. That includes repetitive motions and heavy lifting. Where to find more information:  National Fibromyalgia Association: www.fmaware.org  Arthritis Foundation: www.arthritis.org  American Chronic Pain Association: www.theacpa.org/condition/myofascial-pain Contact a health care provider if:  You have new symptoms.  Your symptoms get worse.  You have side effects from your medicines.  You have trouble sleeping.  Your condition is causing depression or anxiety. This information is not intended to replace advice given to you by your health care provider. Make sure you discuss any questions you have with your health care provider. Document Released: 09/22/2005 Document Revised: 02/28/2016 Document Reviewed: 06/28/2014 Elsevier Interactive Patient Education  2018 Elsevier Inc.  

## 2017-04-03 DIAGNOSIS — I8393 Asymptomatic varicose veins of bilateral lower extremities: Secondary | ICD-10-CM | POA: Insufficient documentation

## 2017-04-03 DIAGNOSIS — R0683 Snoring: Secondary | ICD-10-CM | POA: Insufficient documentation

## 2017-04-03 HISTORY — DX: Asymptomatic varicose veins of bilateral lower extremities: I83.93

## 2017-04-03 HISTORY — DX: Snoring: R06.83

## 2017-04-03 NOTE — Assessment & Plan Note (Signed)
Possible Fibromyalgia Trial Lyrica 50 mg TID Warned of sedation, weight gain, edema.  No driving or operating heavy equipment until try med to assess effect on alertness and reflexes.  RTC 2 weeks to assess tolerance and effect. No stopping abruptly.  May add Duloxetine.

## 2017-04-03 NOTE — Assessment & Plan Note (Signed)
Adequate blood pressure control.  No evidence of new end organ damage.  Tolerating medication without significant adverse effects.  Plan to continue current blood pressure regiment.   

## 2017-04-03 NOTE — Progress Notes (Signed)
Subjective:    Patient ID: Carla Berry, female    DOB: January 18, 1971, 46 y.o.   MRN: [id number] Carla Berry is accompanied by daughter, 52 yr old. Sources of clinical information for visit is/are patient and past medical records. Nursing assessment for this office visit was reviewed with the patient for accuracy and revision.   HPI CHRONIC HYPERTENSION  Disease Monitoring  Blood pressure range: not checking at home  Chest pain: no   Dyspnea: no   Claudication: no   Medication compliance: yes  Medication Side Effects  Lightheadedness: yes   Urinary frequency: no   Edema: yes    Preventitive Healthcare:  Exercise: yes, walking     Salt Restriction: no   Generalized Pain    Patient has a many years history of soft tissue pain: abdomen, back, chest, bilateral leg, neck and trunk. severity: varies from moderate to severe: course over time: gradually worsening. fatigue: moderate: course over time: gradually worsening. headaches: moderate: course over time: intermittent. psychosocial stressors: adolescent dgt health.  Onset was gradual.  The symptoms are of moderate severity.  They are made worse by: movement, overuse, resting and lying in bed. T they are helped by nothing. T The patient denies fever, truly inflamed joints, rash, localizing neurologic symptoms, unexplained weight loss.  Previous treatments include OTC meds and Massage Therapy. Unable to tolerate discomfort of massage of muscles Associated symptoms include fatigue.  Patient denies associated arthralgia, depression, fevers, joint pain, morning stiffness, oral ulcers and rashes/photosensitive.  Evaluation to date includes TSH: normal CBC: normal CMET normal.  Recent interventions include no specific treatment yet undertaken..   Limitation on activities include sleep and at work as Training and development officer at [employer].  Patient  is still working full time, no problems with absenteeism. Currently on summer  vacation. Feels lightheaded intermittently unrelated to movment, or transitions.  Firm subucutaneous areas on abdomin, back, legs, arms.  OV with me 04/04/16: CC Chronic myalgia, suspected fibromyalgia with poor sleep.  Discussed sleep and exercise interventions.  No changes in exercise or sleep reported    Depression screen [facility] 2/9 04/02/2017  Decreased Interest 0  Down, Depressed, Hopeless 0  PHQ - 2 Score 0  Altered sleeping -  Tired, decreased energy -  Change in appetite -  Feeling bad or failure about yourself  -  Trouble concentrating -  Moving slowly or fidgety/restless -  Suicidal thoughts -  PHQ-9 Score -  Difficult doing work/chores -   Snoring - present for many years - Dgt reports snoring is daily.  She has not heard gasping or episodes of stopping breathing.   - No falling asleep when in situations of little stimulation - (+) headaches, but not necessarily mornings. - No falling asleep driving.  SH: No alcohol, no smoking, no illicit drug use. . No unsafe relationships.          Reports walking 2 miles 4 times a week PMH: Functional dyspepsia, chronic myalgia Review of Systems  12 point ROS form reviewed (+) Varicose vains (+) general wekaness, lightheadedness, swelling of hands and feet,     Objective:   Physical Exam  Constitutional: She is oriented to person, place, and time. She appears well-developed. No distress.  obese  HENT:  Head: Normocephalic and atraumatic.  Eyes: Conjunctivae are normal. Pupils are equal, round, and reactive to light. Right eye exhibits no discharge. Left eye exhibits no discharge.  Neck: Normal range of motion. Neck supple.  Cardiovascular: Normal rate and regular  rhythm.   No murmur heard. Pulmonary/Chest: Effort normal and breath sounds normal.  Abdominal: Soft.  Tender superficial palpation left and right lateral abdominal walls with rim palpable < 1 cm nodules without overlying erythema  Musculoskeletal:       Arms:       Legs: Neurological: She is alert and oriented to person, place, and time. She has normal reflexes.  Skin: Skin is warm and dry. No rash noted. She is not diaphoretic. No pallor.  Psychiatric: She has a normal mood and affect. Her behavior is normal. Judgment and thought content normal.  no edmea of hands or feet, no ankle edema, no eryhtmea of toes or fingers or wrists.         Assessment & Plan:

## 2017-04-03 NOTE — Assessment & Plan Note (Signed)
No sleep latency decrease Longstanding insomnia.  ?Polysomnography?

## 2017-04-16 ENCOUNTER — Encounter: Payer: Self-pay | Admitting: Family Medicine

## 2017-04-16 ENCOUNTER — Ambulatory Visit (INDEPENDENT_AMBULATORY_CARE_PROVIDER_SITE_OTHER): Payer: BLUE CROSS/BLUE SHIELD | Admitting: Family Medicine

## 2017-04-16 ENCOUNTER — Other Ambulatory Visit (HOSPITAL_COMMUNITY)
Admission: RE | Admit: 2017-04-16 | Discharge: 2017-04-16 | Disposition: A | Discharge status: Home / Self Care | Payer: BLUE CROSS/BLUE SHIELD | Source: Ambulatory Visit | Attending: Family Medicine | Admitting: Family Medicine

## 2017-04-16 VITALS — BP 128/86 | HR 81 | Temp 97.7°F | Ht 64.0 in | Wt 247.6 lb

## 2017-04-16 VITALS — BP 124/82 | HR 66 | Temp 98.2°F | Wt 247.0 lb

## 2017-04-16 DIAGNOSIS — Z124 Encounter for screening for malignant neoplasm of cervix: Secondary | ICD-10-CM | POA: Insufficient documentation

## 2017-04-16 DIAGNOSIS — M791 Myalgia, unspecified site: Secondary | ICD-10-CM

## 2017-04-16 DIAGNOSIS — Z87412 Personal history of vulvar dysplasia: Secondary | ICD-10-CM | POA: Diagnosis not present

## 2017-04-16 DIAGNOSIS — R0683 Snoring: Secondary | ICD-10-CM

## 2017-04-16 MED ORDER — CYCLOBENZAPRINE HCL 5 MG PO TABS: 5.0000 mg | ORAL_TABLET | Freq: Every evening | ORAL | 0 refills | Status: DC | PRN

## 2017-04-16 NOTE — Assessment & Plan Note (Addendum)
External exam with colposcopy of the vulvar area today is negative.   I think it would be fine for her to follow-up in one year or 2 years for repeat vulvar examination. Her partial vulvar excision was in 2005 so at this point she's more than 13 years out. Per ASCCP, recommendations are for annual follow up , but that could be a visual inspection one year alternating with colposcopi examination every other year. Either way, I am always happy to see her.  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 Higher recurrence rates also are seen with multiple lesions (24). Follow up has been limited in most studies, and women with vulvar HSIL (VIN usual type) are at risk of recurrent disease and vulvar cancer throughout their lifetimes. The value of vulvar self-examination and serial office visits in the detection of recurrence has not been proved, but both appear prudent. Given the relatively slow rate of progression, women with a complete response to therapy and no new lesions at follow-up visits scheduled 6 months and 12 months after initial treatment should be monitored by visual inspection of the vulva annually thereafter

## 2017-04-16 NOTE — Assessment & Plan Note (Signed)
Established problem Low level snoring. No sleep latency nor overt complications  Monitor for progression for now.

## 2017-04-16 NOTE — Assessment & Plan Note (Signed)
Established problem Uncontrollled Intolerant of Pregabalin (peripheral edema and sedation) Does not want to try Duloxetine Requests trial of muscle relaxant prn at bedtime  Start Flexeril 5 mg at bedtime prn muscle aches Exercise Weight loss Regular sleep

## 2017-04-16 NOTE — Progress Notes (Signed)
   Subjective:    Patient ID: Carla Berry, female    DOB: [birth year], 46 y.o.   MRN: [id number] Carla Berry is accompanied by daughter, Carla Berry Sources of clinical information for visit is/are patient and past medical records. Nursing assessment for this office visit was reviewed with the patient for accuracy and revision.   HPI  Fibromyalgia Patient here for follow up on fibromyalgia.  Patient has a several years history of soft tissue pain: abdomen, back, buttocks and bilateral leg. severity: moderate: course over time: gradually worsening..  Onset was gradual.  The symptoms are of intermittent mild to moderate severity.  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 T they are helped by nothing. T The patient denies fever, truly inflamed joints, rash, unexplained weight loss.  Previous treatments include Lyrica which caused peripheral edema and sedation..  Associated symptoms include alopecia.  Patient denies associated fatigue and morning stiffness.  Evaluation to date includes diagnosis is of longstanding, no recent workup.Marland Kitchen  Recent interventions include Lyrica.   Limitation on activities include none.  Patient  Off work during school summer holiday.  Snoring History elements: Onset / Duration: several years ago Quality: soft snore as reported by dgt Severity: mild Timing: intermittent Course: intermittent Treatment prior to visit: none Modifying Factors: sleeping on side Associated symptoms: Epworth sleepiness scale 3, no gasping in sleep, no awakening from sleep short of breath. No morning headaches, (+) Difficulty falling asleep and early awakening, denies depressed mood, denies loss of pleasure in life.    SH: No smoking, no alcohol  Review of Systems See HPI    Objective:   Physical Exam VS reviewed General: Alert, groomed, cooperative, NAD HEENT: large neck circumference without mass or thyromegaly.  Ext: no  edema    Assessment & Plan:

## 2017-04-16 NOTE — Progress Notes (Signed)
    CHIEF COMPLAINT / HPI:  Here for Pap smear and colposcopy of vulvar area. Personal history of vulvar cancer status post resection.   REVIEW OF SYSTEMS:  She's been doing well. She has not noticed any new lesions or any unusual pain or bleeding of the vulvar area.  OBJECTIVE:  Vital signs are reviewed.  GEN.: Well-developed female, obese, no acute distress GU: Externally female genitalia is normal. Colposcopic exam of the entire for her area was performed. No lesions were seen. All tissue looked healthy and normal. No acetowhite lesions. Nothing seen on green filter. Pap smear is obtained. Cervix appears normal. No adnexal masses or tenderness are noted although exam is limited somewhat by habitus.  ASSESSMENT / PLAN: Well woman GYN: Pap smear obtained today. #2. Personal history of vulvar cancer.

## 2017-04-16 NOTE — Patient Instructions (Addendum)
Myofascial Pain Syndrome and Fibromyalgia Myofascial pain syndrome and fibromyalgia are both pain disorders. This pain may be felt mainly in your muscles.  Myofascial pain syndrome: ? Always has trigger points or tender points in the muscle that will cause pain when pressed. The pain may come and go. ? Usually affects your neck, upper back, and shoulder areas. The pain often radiates into your arms and hands.  Fibromyalgia: ? Has muscle pains and tenderness that come and go. ? Is often associated with fatigue and sleep disturbances. ? Has trigger points. ? Tends to be long-lasting (chronic), but is not life-threatening.  Fibromyalgia and myofascial pain are not the same. However, they often occur together. If you have both conditions, each can make the other worse. Both are common and can cause enough pain and fatigue to make day-to-day activities difficult. What are the causes? The exact causes of fibromyalgia and myofascial pain are not known. People with certain gene types may be more likely to develop fibromyalgia. Some factors can be triggers for both conditions, such as:  Spine disorders.  Arthritis.  Severe injury (trauma) and other physical stressors.  Being under a lot of stress.  A medical illness.  What are the signs or symptoms? Fibromyalgia The main symptom of fibromyalgia is widespread pain and tenderness in your muscles. This can vary over time. Pain is sometimes described as stabbing, shooting, or burning. You may have tingling or numbness, too. You may also have sleep problems and fatigue. You may wake up feeling tired and groggy (fibro fog). Other symptoms may include:  Bowel and bladder problems.  Headaches.  Visual problems.  Problems with odors and noises.  Depression or mood changes.  Painful menstrual periods (dysmenorrhea).  Dry skin or eyes.  Myofascial pain syndrome Symptoms of myofascial pain syndrome include:  Tight, ropy bands of  muscle.  Uncomfortable sensations in muscular areas, such as: ? Aching. ? Cramping. ? Burning. ? Numbness. ? Tingling. ? Muscle weakness.  Trouble moving certain muscles freely (range of motion).  How is this diagnosed? There are no specific tests to diagnose fibromyalgia or myofascial pain syndrome. Both can be hard to diagnose because their symptoms are common in many other conditions. Your health care provider may suspect one or both of these conditions based on your symptoms and medical history. Your health care provider will also do a physical exam. The key to diagnosing fibromyalgia is having pain, fatigue, and other symptoms for more than three months that cannot be explained by another condition. The key to diagnosing myofascial pain syndrome is finding trigger points in muscles that are tender and cause pain elsewhere in your body (referred pain). How is this treated? Treating fibromyalgia and myofascial pain often requires a team of health care providers. This usually starts with your primary provider and a physical therapist. You may also find it helpful to work with alternative health care providers, such as massage therapists or acupuncturists. Treatment for fibromyalgia may include medicines. This may include nonsteroidal anti-inflammatory drugs (NSAIDs), along with other medicines. Treatment for myofascial pain may also include:  NSAIDs.  Cooling and stretching of muscles.  Trigger point injections.  Sound wave (ultrasound) treatments to stimulate muscles.  Follow these instructions at home:  Take medicines only as directed by your health care provider.  Exercise as directed by your health care provider or physical therapist.  Try to avoid stressful situations.  Practice relaxation techniques to control your stress. You may want to try: ? Biofeedback. ? Visual   imagery. ? Hypnosis. ? Muscle relaxation. ? Yoga. ? Meditation.  Talk to your health care provider  about alternative treatments, such as acupuncture or massage treatment.  Maintain a healthy lifestyle. This includes eating a healthy diet and getting enough sleep.  Consider joining a support group.  Do not do activities that stress or strain your muscles. That includes repetitive motions and heavy lifting. Where to find more information:  National Fibromyalgia Association: www.fmaware.[place]: www.arthritis.org  American Chronic Pain Association: [employer]EMDeals.dk Contact a health care provider if:  You have new symptoms.  Your symptoms get worse.  You have side effects from your medicines.  You have trouble sleeping.  Your condition is causing depression or anxiety. This information is not intended to replace advice given to you by your health care provider. Make sure you discuss any questions you have with your health care provider. Document Released: 09/22/2005 Document Revised: 02/28/2016 Document Reviewed: 06/28/2014 Elsevier Interactive Patient Education  2018 [employer]. Call Orson Eva at [facility] if the [facility] has not received the authorization for Lydia's to see Dr Richardson Landry again.

## 2017-04-17 LAB — CYTOLOGY - PAP
DIAGNOSIS: NEGATIVE
HPV: NOT DETECTED

## 2017-04-19 ENCOUNTER — Encounter: Payer: Self-pay | Admitting: Family Medicine

## 2017-06-22 ENCOUNTER — Encounter: Payer: Self-pay | Admitting: Family Medicine

## 2017-06-22 NOTE — Progress Notes (Signed)
Letter to Sampson Goon' mother, Kayliah Tindol, informing Mrs Reddin of counseling options for Derrick Ravel in the [place] area.

## 2017-06-27 ENCOUNTER — Other Ambulatory Visit: Payer: Self-pay | Admitting: Family Medicine

## 2017-06-27 DIAGNOSIS — Z79899 Other long term (current) drug therapy: Secondary | ICD-10-CM

## 2017-07-21 ENCOUNTER — Other Ambulatory Visit: Payer: Self-pay | Admitting: Family Medicine

## 2017-07-21 DIAGNOSIS — Z79899 Other long term (current) drug therapy: Secondary | ICD-10-CM

## 2017-07-27 ENCOUNTER — Other Ambulatory Visit: Payer: Self-pay | Admitting: Family Medicine

## 2017-07-27 DIAGNOSIS — Z1231 Encounter for screening mammogram for malignant neoplasm of breast: Secondary | ICD-10-CM

## 2017-08-26 ENCOUNTER — Ambulatory Visit
Admission: RE | Admit: 2017-08-26 | Discharge: 2017-08-26 | Disposition: A | Discharge status: Home / Self Care | Payer: BLUE CROSS/BLUE SHIELD | Source: Ambulatory Visit | Attending: Family Medicine | Admitting: Family Medicine

## 2017-08-26 DIAGNOSIS — Z1231 Encounter for screening mammogram for malignant neoplasm of breast: Secondary | ICD-10-CM

## 2017-09-18 ENCOUNTER — Ambulatory Visit: Payer: BLUE CROSS/BLUE SHIELD | Admitting: Internal Medicine

## 2017-09-18 ENCOUNTER — Encounter: Payer: Self-pay | Admitting: Internal Medicine

## 2017-09-18 DIAGNOSIS — J069 Acute upper respiratory infection, unspecified: Secondary | ICD-10-CM | POA: Diagnosis not present

## 2017-09-18 MED ORDER — GUAIFENESIN 200 MG PO TABS: 200.0000 mg | ORAL_TABLET | ORAL | 0 refills | Status: DC | PRN

## 2017-09-18 MED ORDER — BENZONATATE 200 MG PO CAPS: 200.0000 mg | ORAL_CAPSULE | Freq: Three times a day (TID) | ORAL | 0 refills | Status: DC | PRN

## 2017-09-18 NOTE — Progress Notes (Signed)
   Subjective:   Patient: Carla Berry       Birthdate: June 05, 1971       MRN: [id number]      HPI  Carla Berry is a 46 y.o. female presenting for same day appt for cough.   Cough Present for 1 wk. Non-productive. Has been using Tylenol Severe Cough, Mucinex, tea with garlic/honey/lemon, Vicks VapoRub with no improvement. Has also been taking hot steamy showers to try to break up nasal congestion which has not been effective. Decreased appetite but is drinking a lot of water. Is now having trouble eating and sleeping due to persistent cough. Denies fevers. Denies rhinorrhea though does have nasal congestion. Denies ore throat. Daughter was recently sick with similar issues. Does not use tobacco products currently. No history of respiratory issues including asthma.   Smoking status reviewed. Patient is former smoker.   Review of Systems See HPI.     Objective:  Physical Exam  Constitutional: She is oriented to person, place, and time and well-developed, well-nourished, and in no distress.  HENT:  Head: Normocephalic and atraumatic.  Nose: Nose normal.  Mouth/Throat: Oropharynx is clear and moist. No oropharyngeal exudate.  Eyes: Conjunctivae and EOM are normal. Pupils are equal, round, and reactive to light. Right eye exhibits no discharge. Left eye exhibits no discharge.  Cardiovascular: Normal rate, regular rhythm and normal heart sounds.  No murmur heard. Pulmonary/Chest:  Coughing throughout encounter, although normal WOB on RA, able to speak in full sentences, and lungs CTAB.   Neurological: She is alert and oriented to person, place, and time.  Skin: Skin is warm and dry.  Psychiatric: Affect and judgment normal.      Assessment & Plan:  URI (upper respiratory infection) Likely viral etiology. Coughing throughout encounter, however non-toxic in appearance, afebrile, and lungs CTAB. O2 sat 98% on RA. Will treat conseratively.  - Tessalon perles + guaifenesin PRN cough -  Discussed OTC products including Cepacol lozenges and ibuprofen - Return if no improvement    Adin Hector, MD, MPH PGY-3 [facility] Pager [phone]

## 2017-09-18 NOTE — Patient Instructions (Addendum)
It was nice meeting you today Ms. Silsby!  Please begin taking one Tessalon perle with one guaifenesin tablet every 8 hours as needed for cough. If needed, you can take an additional guaifenesin tablet to help with congestion every 4 hours.   Cepachol throat lozenges can be very helpful for cough and sore throat. You can buy these at any drugstore or grocery store.   You can take ibuprofen or Tylenol for fever or body aches.   If you are not feeling better by the middle of next week, please call to schedule another appointment.   If you have any questions or concerns, please feel free to call the clinic.   Be well,  Dr. Avon Gully

## 2017-09-21 NOTE — Assessment & Plan Note (Signed)
Likely viral etiology. Coughing throughout encounter, however non-toxic in appearance, afebrile, and lungs CTAB. O2 sat 98% on RA. Will treat conseratively.  - Tessalon perles + guaifenesin PRN cough - Discussed OTC products including Cepacol lozenges and ibuprofen - Return if no improvement

## 2017-11-26 ENCOUNTER — Encounter (HOSPITAL_COMMUNITY): Payer: Self-pay

## 2017-11-26 ENCOUNTER — Other Ambulatory Visit: Payer: Self-pay

## 2017-11-26 ENCOUNTER — Emergency Department (HOSPITAL_COMMUNITY)
Admission: EM | Admit: 2017-11-26 | Discharge: 2017-11-27 | Disposition: A | Discharge status: Home / Self Care | Payer: BC Managed Care – PPO | Attending: Emergency Medicine | Admitting: Emergency Medicine

## 2017-11-26 ENCOUNTER — Encounter: Payer: Self-pay | Admitting: Internal Medicine

## 2017-11-26 ENCOUNTER — Telehealth: Payer: Self-pay | Admitting: Internal Medicine

## 2017-11-26 ENCOUNTER — Ambulatory Visit (INDEPENDENT_AMBULATORY_CARE_PROVIDER_SITE_OTHER): Payer: BC Managed Care – PPO | Admitting: Internal Medicine

## 2017-11-26 VITALS — BP 124/84 | HR 86 | Temp 98.6°F | Ht 64.0 in | Wt 242.8 lb

## 2017-11-26 DIAGNOSIS — R1084 Generalized abdominal pain: Secondary | ICD-10-CM | POA: Insufficient documentation

## 2017-11-26 DIAGNOSIS — R109 Unspecified abdominal pain: Secondary | ICD-10-CM | POA: Diagnosis not present

## 2017-11-26 DIAGNOSIS — Z87891 Personal history of nicotine dependence: Secondary | ICD-10-CM | POA: Insufficient documentation

## 2017-11-26 DIAGNOSIS — Z79899 Other long term (current) drug therapy: Secondary | ICD-10-CM | POA: Insufficient documentation

## 2017-11-26 HISTORY — DX: Unspecified abdominal pain: R10.9

## 2017-11-26 LAB — I-STAT BETA HCG BLOOD, ED (MC, WL, AP ONLY): I-stat hCG, quantitative: <5 m[IU]/mL (ref <5)

## 2017-11-26 LAB — COMPREHENSIVE METABOLIC PANEL
ALT: 18 U/L (ref 14–54)
AST: 18 U/L (ref 15–41)
Albumin: 3.6 g/dL (ref 3.5–5.0)
Alkaline Phosphatase: 99 U/L (ref 38–126)
Anion gap: 12 (ref 5–15)
BUN: 5 mg/dL — ABNORMAL LOW (ref 6–20)
CO2: 22 mmol/L (ref 22–32)
Calcium: 8.5 mg/dL — ABNORMAL LOW (ref 8.9–10.3)
Chloride: 101 mmol/L (ref 101–111)
Creatinine, Ser: 0.84 mg/dL (ref 0.44–1.00)
GFR calc Af Amer: >60 mL/min (ref >60)
GFR calc non Af Amer: >60 mL/min (ref >60)
Glucose, Bld: 95 mg/dL (ref 65–99)
Potassium: 3 mmol/L — ABNORMAL LOW (ref 3.5–5.1)
Sodium: 135 mmol/L (ref 135–145)
Total Bilirubin: 0.8 mg/dL (ref 0.3–1.2)
Total Protein: 7.2 g/dL (ref 6.5–8.1)

## 2017-11-26 LAB — URINALYSIS, ROUTINE W REFLEX MICROSCOPIC
Bacteria, UA: NONE SEEN
Bilirubin Urine: NEGATIVE
Glucose, UA: NEGATIVE mg/dL
Ketones, ur: 5 mg/dL — AB
Leukocytes, UA: NEGATIVE
Nitrite: NEGATIVE
Protein, ur: NEGATIVE mg/dL
Specific Gravity, Urine: 1.014 (ref 1.005–1.030)
pH: 5 (ref 5.0–8.0)

## 2017-11-26 LAB — CBC
HCT: 42.7 % (ref 36.0–46.0)
Hemoglobin: 13.8 g/dL (ref 12.0–15.0)
MCH: 26.2 pg (ref 26.0–34.0)
MCHC: 32.3 g/dL (ref 30.0–36.0)
MCV: 81.2 fL (ref 78.0–100.0)
Platelets: 353 10*3/uL (ref 150–400)
RBC: 5.26 MIL/uL — ABNORMAL HIGH (ref 3.87–5.11)
RDW: 15.3 % (ref 11.5–15.5)
WBC: 8.1 10*3/uL (ref 4.0–10.5)

## 2017-11-26 LAB — LIPASE, BLOOD: Lipase: 22 U/L (ref 11–51)

## 2017-11-26 MED ORDER — HYDROMORPHONE HCL 1 MG/ML IJ SOLN
0.5000 mg | Freq: Once | INTRAMUSCULAR | Status: AC
  Administered 2017-11-27: 0.5 mg via INTRAVENOUS
  Filled 2017-11-26: qty 1

## 2017-11-26 MED ORDER — POTASSIUM CHLORIDE CRYS ER 20 MEQ PO TBCR
40.0000 meq | EXTENDED_RELEASE_TABLET | Freq: Once | ORAL | Status: AC
  Administered 2017-11-27: 40 meq via ORAL
  Filled 2017-11-26: qty 2

## 2017-11-26 MED ORDER — FAMOTIDINE 20 MG IN NS 100 ML IVPB
20.0000 mg | Freq: Once | INTRAVENOUS | Status: AC
  Administered 2017-11-27: 20 mg via INTRAVENOUS
  Filled 2017-11-26: qty 100

## 2017-11-26 MED ORDER — IOPAMIDOL (ISOVUE-300) INJECTION 61%
INTRAVENOUS | Status: AC
  Filled 2017-11-26: qty 30

## 2017-11-26 NOTE — ED Triage Notes (Signed)
Pt endorses generalized abd pain that began yesterday at 1400, denies n/v/d. Pain is worse every time the pt tries to eat or drink. Hx of colitis and this feels the same. PCP ordered pt to have CT scan but couldn't do it until Tuesday. VSS.

## 2017-11-26 NOTE — Progress Notes (Signed)
   Subjective:   Patient: Carla Berry       Birthdate: 21-Dec-1970       MRN: [id number]      HPI  Carla Berry is a 47 y.o. female presenting for walk in appt for abd pain.   Abdominal pain Started about 24hrs ago. Has been persistent. Describes constant dull pain with intermittent sharp pains. Is concerned because she had similar symptoms in 2014 and was hospitalized with colitis. Pain began in upper quadrants bilaterally and has now progressed to generalized pain. Last ate at lunchtime yesterday. Has been drinking water but says even this worsens her pain. Last BM yesterday. No N/V/D. No fevers, chills. No hematuria, dysuria, increased frequency. Has a history of kidney stones many years ago and had an episode of low back pain a few weeks ago, however this pain has resolved and she had no hematuria or other urinary symptoms at that time. History of low transverse CS in 1992 and 2005, otherwise no abd surgeries. Is still menstruating, with LMP within the past month.   Smoking status reviewed. Patient is former smoker.   Review of Systems See HPI.     Objective:  Physical Exam  Constitutional: She is oriented to person, place, and time.  Well-appearing female sitting on exam table in no acute distress.   HENT:  Head: Normocephalic and atraumatic.  Pulmonary/Chest: Effort normal. No respiratory distress.  Abdominal:  TTP of RLQ. Hypoactive bowel sounds. Soft, non-distended. No masses, no guarding, no rebound.   Neurological: She is alert and oriented to person, place, and time.  Skin: Skin is warm and dry.  Psychiatric: Affect and judgment normal.      Assessment & Plan:  Abdominal pain Etiology unclear, however patient with no red flags and well-appearing on exam. Does have TTP in RLQ. Differential includes appendicitis, colitis, ovarian cyst, among others. Would not expect generalized pain with ovarian cyst, however still on differential. Less likely appendicitis as patient  looks very well on exam. Would also expect nausea and/or vomiting and progressive worsening of pain with appendicitis, which patient has not had. Colitis in differential, however would expect bloody bowel movements. Less likely constipation as last BM yesterday. Less likely kidney stones as patient without colicky pain and no urinary symptoms. Discussed with patient's PCP Dr. McDiarmid per her request, who agrees that patient is stable for outpatient imaging. CT abd/pelvis with contrast ordered outpatient. First available imaging appt was 02/26. Patient felt that this was unacceptable and went to ED for workup so imaging could be performed today.   Precepted with Dr. Wendy Poet, Gerarda Fraction.   Adin Hector, MD, MPH PGY-3 [facility] Pager [phone]

## 2017-11-26 NOTE — Patient Instructions (Signed)
It was nice seeing you again today Ms. Pusch!  Please go to the emergency room if your pain worsens, if you notice blood in your stool or urine, or if you are unable to tolerate liquids due to vomiting.   We will call you when the results of your imaging studies are ready.   If you have any questions or concerns, please feel free to call the clinic.   Be well,  Dr. Avon Gully

## 2017-11-26 NOTE — ED Provider Notes (Signed)
[facility] EMERGENCY DEPARTMENT Provider Note   CSN: [id number] Arrival date & time: 11/26/17  1516     History   Chief Complaint Chief Complaint  Patient presents with  . Abdominal Pain    HPI Carla Berry is a 47 y.o. female.  HPI   47 year old female with abdominal pain.  Generalized.  Onset yesterday.  She describes a constant dull ache and occasionally gets sharper pain that "pops up in different areas."  Worse after eating.  No nausea vomiting or diarrhea.  No urinary complaints.  Past Medical History:  Diagnosis Date  . Chronic cough 10/12/2015  . Diastolic hypertension 0/93/8182  . FIBROIDS, UTERUS 02/08/2007   Qualifier: Diagnosis of  By: McDiarmid MD, Sherren Mocha    . Ganglion of right wrist 04/04/2016  . Gastric polyp 07/23/12   Pathology: Chronic inactive gastritis.  EGD by Dr Verdia Kuba (GI)  . Gastritis, acute 07/16/2012   Dr Collene Mares (GI) ordered abdominal US that was normal (07/13/12).  Dr Collene Mares ordered Nuc Med Hepatobiliary Tc99 scan with cholecystikinin stimulation which was normal on 07/13/12.  Upper Endoscopy by Dr Collene Mares on 07/21/12 showed mild antral gastritis and sessile gastric polyps that were benign on histopathology    . GASTROESOPHAGEAL REFLUX, NO ESOPHAGITIS 12/03/2006   Qualifier: Diagnosis of  By: Drucie Ip    . Glucose intolerance 12/03/2006   Qualifier: Diagnosis of  By: McDiarmid MD, Sherren Mocha    . Hair loss 04/04/2016  . History of nephrolithiasis 12/03/2006   Qualifier: History of  By: McDiarmid MD, Sherren Mocha    . MIGRAINE, UNSPEC., W/O INTRACTABLE MIGRAINE 12/03/2006   Qualifier: Diagnosis of  By: Drucie Ip    . Myalgia, Chronic   . PALPITATIONS, RECURRENT 07/31/2008   Qualifier: History of  By: McDiarmid MD, Sherren Mocha    . PAPANICOLAOU SMEAR, ABNORMAL 12/03/2006   Qualifier: History of By: McDiarmid MD, Sherren Mocha    . Skin lesion of chest wall 11/17/2014  . Suprapubic pain 11/30/2014  . TOBACCO USE, QUIT 12/11/2009   Qualifier: Diagnosis of  By:  Shelbie Proctor    . Urinary incontinence, mixed   . 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 Abdominal pain 11/26/2017  . Snoring 04/03/2017  . Myalgia, Chronic   . Urinary incontinence, mixed   . Diastolic hypertension 99/37/1696  . Functional dyspepsia 10/12/2015  . Personal history of vulvar dysplasia 11/30/2014  . Metabolic syndrome 78/93/8101  . URI (upper respiratory infection) 08/26/2013  . Severe obesity (BMI >= 40) ([place]) 12/03/2006  . RHINITIS, ALLERGIC 12/03/2006  . Glucose intolerance 12/03/2006  . GASTROESOPHAGEAL REFLUX, NO ESOPHAGITIS 12/03/2006    Past Surgical History:  Procedure Laterality Date  . CESAREAN SECTION     x 2  . COLONOSCOPY N/A 02/08/2013   Procedure: COLONOSCOPY;  Surgeon: Beryle Beams, MD;  Location: [facility];  Service: Endoscopy;  Laterality: N/A;  . TONSILLECTOMY    . TUBAL LIGATION    . 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 Authorizing Provider  amLODipine (NORVASC) 10 MG tablet TAKE 1 TABLET (10 MG TOTAL) BY  MOUTH DAILY. 07/21/17  Yes McDiarmid, Blane Ohara, MD  cetirizine (ZYRTEC) 10 MG tablet Take 1 tablet (10 mg total) by mouth daily. 02/04/17  Yes McDiarmid, Blane Ohara, MD  esomeprazole (NEXIUM) 40 MG capsule TAKE 1 CAPSULE BY MOUTH DAILY 02/02/17  Yes McDiarmid, Blane Ohara, MD  hydrochlorothiazide (HYDRODIURIL) 25 MG tablet TAKE 0.5 TABLETS (12.5 MG TOTAL) BY MOUTH DAILY. 06/29/17  Yes McDiarmid, Blane Ohara, MD  Probiotic Product (PROBIOTIC DAILY PO) Take 1 capsule by mouth daily as needed. Reported on 09/20/2015   Yes [provider]  benzonatate (TESSALON) 200 MG capsule Take 1 capsule (200 mg total) by mouth 3 (three)  times daily as needed for cough. Patient not taking: Reported on 11/26/2017 09/18/17   Verner Mould, MD  cyclobenzaprine (FLEXERIL) 5 MG tablet Take 1 tablet (5 mg total) by mouth at bedtime as needed for muscle spasms. Patient not taking: Reported on 11/26/2017 04/16/17   McDiarmid, Blane Ohara, MD  fluticasone (FLONASE) 50 MCG/ACT nasal spray Place 2 sprays into both nostrils daily. Patient not taking: Reported on 11/26/2017 01/31/16   Olam Idler, MD  guaiFENesin 200 MG tablet Take 1 tablet (200 mg total) by mouth every 4 (four) hours as needed for cough or to loosen phlegm. Patient not taking: Reported on 11/26/2017 09/18/17   Verner Mould, MD    Family History Family History  Problem Relation Age of Onset  . Hypertension Mother   . Diabetes Mother   . Nephrolithiasis Mother   . Stroke Mother   . Early death Father        ?Encephalitis  . Factor V Leiden deficiency Maternal Grandmother   . Stroke Maternal Grandfather     Social History Social History   Tobacco Use  . Smoking status: Former Smoker    Packs/day: 1.00    Years: 15.00    Pack years: 15.00    Types: Cigarettes    Start date: 10/07/1987    Last attempt to quit: 07/07/2003    Years since quitting: 14.4  . Smokeless tobacco: Never Used  Substance Use Topics  . Alcohol use: No    Alcohol/week: 0.0 oz  . Drug use: No     Allergies   Codeine; Prednisone; and Pregabalin   Review of Systems Review of Systems  All systems reviewed and negative, other than as noted in HPI.  Physical Exam Updated Vital Signs BP 115/79   Pulse 72   Temp 98.5 F (36.9 C) (Oral)   Resp 18   Ht 5' 3.5" (1.613 m)   Wt 109.8 kg (242 lb)   LMP 10/30/2017 (Approximate)   SpO2 94%   BMI 42.20 kg/m   Physical Exam  Constitutional: She appears well-developed and well-nourished. No distress.  HENT:  Head: Normocephalic and atraumatic.  Eyes: Conjunctivae are normal. Right eye exhibits no discharge. Left  eye exhibits no discharge.  Neck: Neck supple.  Cardiovascular: Normal rate, regular rhythm and normal heart sounds. Exam reveals no gallop and no friction rub.  No murmur heard. Pulmonary/Chest: Effort normal and breath sounds normal. No respiratory distress.  Abdominal: Soft. She exhibits no distension. There is tenderness.  Musculoskeletal: She exhibits no edema or tenderness.  Neurological: She is alert.  Skin: Skin is warm and dry.  Psychiatric: She has a normal mood and affect. Her behavior is normal. Thought content normal.  Nursing note and vitals reviewed.    ED Treatments / Results  Labs (all labs ordered are listed, but only abnormal  results are displayed) Labs Reviewed  COMPREHENSIVE METABOLIC PANEL - Abnormal; Notable for the following components:      Result Value   Potassium 3.0 (*)    BUN 5 (*)    Calcium 8.5 (*)    All other components within normal limits  CBC - Abnormal; Notable for the following components:   RBC 5.26 (*)    All other components within normal limits  URINALYSIS, ROUTINE W REFLEX MICROSCOPIC - Abnormal; Notable for the following components:   Hgb urine dipstick MODERATE (*)    Ketones, ur 5 (*)    Squamous Epithelial / LPF 0-5 (*)    All other components within normal limits  LIPASE, BLOOD  I-STAT BETA HCG BLOOD, ED ([facility], [facility], [facility] ONLY)    EKG  EKG Interpretation None       Radiology No results found.  Procedures Procedures (including critical care time)  Medications Ordered in ED Medications  iopamidol (ISOVUE-300) 61 % injection (not administered)  HYDROmorphone (DILAUDID) injection 0.5 mg (not administered)     Initial Impression / Assessment and Plan / ED Course  I have reviewed the triage vital signs and the nursing notes.  Pertinent labs & imaging results that were available during my care of the patient were reviewed by me and considered in my medical decision making (see chart for details).     47 year old female  with abdominal pain.  Diffuse.  She is afebrile.  Generally well-appearing.  Some tenderness on exam, but no peritonitis.  PCP was apparently planning for CT.  Will obtain today.  Disposition pending results.  Final Clinical Impressions(s) / ED Diagnoses   Final diagnoses:  Generalized abdominal pain    ED Discharge Orders    None       Virgel Manifold, MD 11/26/17 2339

## 2017-11-26 NOTE — Telephone Encounter (Signed)
Emergency Line Page:   Patient reports stabbing abdominal pain in epigastric region with associated "gurgling" since 3 pm on 2/21. It worsens if she eats or drinks anything. Has not had any vomiting, diarrhea, or fevers. Endorses fatigue. She has a history of GERD and takes Nexium daily. Thought it may be related to acid reflux, "really bad gas", or to a stomach bug. However, she got nervous because she has a history of being hospitalized for colitis several years ago and that's why she called after hours line. I have recommended that patient call clinic in about 1 hour when it opens to get an appointment for today. If symptoms worsen, I have recommended ER evaluation. Will route to PCP as FYI.   Phill Myron, D.O. 11/26/2017, 7:21 AM PGY-3, [place] [facility]

## 2017-11-26 NOTE — Assessment & Plan Note (Signed)
Etiology unclear, however patient with no red flags and well-appearing on exam. Does have TTP in RLQ. Differential includes appendicitis, colitis, ovarian cyst, among others. Would not expect generalized pain with ovarian cyst, however still on differential. Less likely appendicitis as patient looks very well on exam. Would also expect nausea and/or vomiting and progressive worsening of pain with appendicitis, which patient has not had. Colitis in differential, however would expect bloody bowel movements. Less likely constipation as last BM yesterday. Less likely kidney stones as patient without colicky pain and no urinary symptoms. Discussed with patient's PCP Dr. McDiarmid per her request, who agrees that patient is stable for outpatient imaging. CT abd/pelvis with contrast ordered outpatient. First available imaging appt was 02/26. Patient felt that this was unacceptable and went to ED for workup so imaging could be performed today.

## 2017-11-27 ENCOUNTER — Emergency Department (HOSPITAL_COMMUNITY): Payer: BC Managed Care – PPO

## 2017-11-27 DIAGNOSIS — R1084 Generalized abdominal pain: Secondary | ICD-10-CM | POA: Diagnosis not present

## 2017-11-27 DIAGNOSIS — R109 Unspecified abdominal pain: Secondary | ICD-10-CM | POA: Diagnosis not present

## 2017-11-27 MED ORDER — IOPAMIDOL (ISOVUE-300) INJECTION 61%
INTRAVENOUS | Status: AC
  Administered 2017-11-27: 100 mL
  Filled 2017-11-27: qty 100

## 2017-11-27 NOTE — ED Provider Notes (Signed)
Ct Abdomen Pelvis W Contrast  Result Date: 11/27/2017 CLINICAL DATA:  Generalized abdominal pain EXAM: CT ABDOMEN AND PELVIS WITH CONTRAST TECHNIQUE: Multidetector CT imaging of the abdomen and pelvis was performed using the standard protocol following bolus administration of intravenous contrast. CONTRAST:  19mL ISOVUE-300 IOPAMIDOL (ISOVUE-300) INJECTION 61% COMPARISON:  02/05/2013, 05/10/2009 FINDINGS: Lower chest: No acute abnormality. Hepatobiliary: No focal liver abnormality is seen. No gallstones, gallbladder wall thickening, or biliary dilatation. Pancreas: Unremarkable. No pancreatic ductal dilatation or surrounding inflammatory changes. Spleen: Normal in size without focal abnormality. Adrenals/Urinary Tract: Adrenal glands are unremarkable. Kidneys are normal, without renal calculi, focal lesion, or hydronephrosis. Bladder is unremarkable. Stomach/Bowel: Stomach is within normal limits. Appendix appears normal. No evidence of bowel wall thickening, distention, or inflammatory changes. Scattered left colon diverticula without acute inflammation Vascular/Lymphatic: No significant vascular findings are present. No enlarged abdominal or pelvic lymph nodes. Reproductive: No adnexal masses. 2 cm low-density lesion in the right lower uterine segment, probably a degenerative fibroid. Other: Negative for free air or free fluid.  Fatty umbilical hernia Musculoskeletal: No acute or significant osseous findings. IMPRESSION: 1. No CT evidence for acute intra-abdominal or pelvic abnormality 2. Probable degenerating uterine fibroid Electronically Signed   By: Donavan Foil M.D.   On: 11/27/2017 02:10     I personally reviewed the imaging tests through PACS system I reviewed available ER/hospitalization records through the EMR   2:47 AM Overall well-appearing.  CT imaging without acute pathology.  Outpatient primary care follow-up.  She likely needs referral to a gastroenterologist.  I will allow her primary  care physician to complete this.  Patient understands to return to the emergency department for new or worsening symptoms   Jola Schmidt, MD 11/27/17 [phone]

## 2018-02-02 ENCOUNTER — Other Ambulatory Visit: Payer: Self-pay | Admitting: Family Medicine

## 2018-02-23 ENCOUNTER — Other Ambulatory Visit: Payer: Self-pay | Admitting: Family Medicine

## 2018-02-23 DIAGNOSIS — J0110 Acute frontal sinusitis, unspecified: Secondary | ICD-10-CM

## 2018-04-24 ENCOUNTER — Other Ambulatory Visit: Payer: Self-pay | Admitting: Family Medicine

## 2018-05-20 ENCOUNTER — Ambulatory Visit (INDEPENDENT_AMBULATORY_CARE_PROVIDER_SITE_OTHER): Payer: BC Managed Care – PPO | Admitting: Family Medicine

## 2018-05-20 ENCOUNTER — Encounter: Payer: Self-pay | Admitting: Family Medicine

## 2018-05-20 VITALS — BP 116/68 | HR 83 | Temp 98.0°F | Wt 247.0 lb

## 2018-05-20 DIAGNOSIS — E876 Hypokalemia: Secondary | ICD-10-CM

## 2018-05-20 DIAGNOSIS — D509 Iron deficiency anemia, unspecified: Secondary | ICD-10-CM | POA: Diagnosis not present

## 2018-05-20 DIAGNOSIS — J45990 Exercise induced bronchospasm: Secondary | ICD-10-CM | POA: Diagnosis not present

## 2018-05-20 DIAGNOSIS — E611 Iron deficiency: Secondary | ICD-10-CM

## 2018-05-20 DIAGNOSIS — Z87412 Personal history of vulvar dysplasia: Secondary | ICD-10-CM

## 2018-05-20 DIAGNOSIS — D259 Leiomyoma of uterus, unspecified: Secondary | ICD-10-CM

## 2018-05-20 DIAGNOSIS — G2581 Restless legs syndrome: Secondary | ICD-10-CM

## 2018-05-20 DIAGNOSIS — M797 Fibromyalgia: Secondary | ICD-10-CM

## 2018-05-20 DIAGNOSIS — N920 Excessive and frequent menstruation with regular cycle: Secondary | ICD-10-CM

## 2018-05-20 DIAGNOSIS — M791 Myalgia, unspecified site: Secondary | ICD-10-CM

## 2018-05-20 MED ORDER — CYCLOBENZAPRINE HCL 5 MG PO TABS: 5.0000 mg | ORAL_TABLET | Freq: Three times a day (TID) | ORAL | 5 refills | Status: DC | PRN

## 2018-05-20 NOTE — Patient Instructions (Addendum)
We are scheduling you I the [facility] to follow up your VAIN and pap smear.   We are checking for iron deficiency today.  We are checking your potassium level today.  Flexeril was sent into the pharmacy to use when you have bad fibromyalgia symptoms.  It should also help with sleep.. It is non-addictive.

## 2018-05-20 NOTE — Progress Notes (Signed)
Subjective:    Patient ID: Carla Berry, female    DOB: [birth year], 47 y.o.   MRN: [id number]  HPI Fibromyalgia Patient here for follow up on fibromyalgia.  Patient has a several years history of soft tissue pain: back, neck and trunk. Severity:  FM interferes with somewhat with climbing a flight of stairs.  She feel mild-moderately overwhelmed by FM sxs.; moderate pain rating, moderate loss of energy, moderate severe stiffness, moderate severe problems with sleep, moderate level of depression, moderate problems with memory, moderate severe anxiety, severe tenderness to touch, moderate problems with balance, mild moderate senistivity to noise, light, odors, cold. FIQR subtotal Activity score = 5, Subtotal impact score = 6, Subtotal symptom intensity score = 62, Total FIQR score = 73.  overall course: stable, over time they are helped by Flexeril at bedtime when symptoms are very severe. T The patient denies fever, truly inflamed joints, unexplained weight loss.  Previous treatments include Massage Therapy. Declined Duloxetine.  Not exercising. Intolerant of Pregabalin (edema and sedation) Associated symptoms include depression, fatigue, memory loss, morning stiffness and tenderness to touch, abd anxuiety.  Patient denies associated joint pain and oral ulcers.  Evaluation to date includes TSH: normal CBC: normal.  Recent interventions include started Flexeril which has been somewhat effective.   Limitation on activities include none.  Patient  is still working full time, no problems with absenteeism.  Abnormal Uterine Bleeding LMP: 04/19/18 Usual Menses characteristics:     Frequency: monthly     Regularity: yes, though had a brief midcyle bleed in May where passed clots     Duration: usually 6 days which is longer than in past Onset of change: in last year Severity: mild Function impairment: none  Heavy Menstrual Bleed: Yes  Self-treatment: none Associated Symptoms: Lower  abdominal pain: no Fever: no Vaginal discharge: no Dysmenorrhea: yes  Galactorrhea: no,  Hirsutism: no Hot flashes: no   Cyclicity of Symptoms  Cyclic Symptoms: cyclic, for most part    Intermenstrual bleed Symptoms: once in mid-cycle in May   Prior Diagnostic Testing or Treatments: CT AP 11/27/17 for generalized abdominal pain showed 2 cm degenerative uterine fibroid.   Relevant PMH/PSH: Prior Pelvic Surgery: BTL   Medications: Anticoagulants/Antiplatelets: no Hormone/OCP: no        Endometrial Cancer Risk Factors:  PCOS diagnosis: No Menarche age: 89  Late Menopause age: NA   DIABETES: no      Unopposed Estrogen Therapy: obesity   Gravid 2Parity 2  Contraceptive history:     Current: BTL     Past: trial OCP as young adult    ROS: Lactation: No  Stress: Yes  Eating D/O: no  Intense Exercise: no   Pap: 04/16/17 Adequate, no evidence intraepithelial dysplasia, High-risk HPV negative  Endometrial Biopsy: none  Acne:  no   Labs    LFTs: WNL 11/26/17    CBC: WNL 11/26/17    TSH: WNL 04/03/16    GC/Chlamydia: Chlamydia positive 04/10/16 (treated), Chlamydia negative 05/15/16 and 03/19/17. All GC tests negative.   Imaging:       Pelvic Ultrasound: TVUS 12/06/14 for abd pain: 1.6 cm fibroid o/w WNL     CT AP 11/27/17: degenerating fibroid   PALM-COIEN differential Pregnancy Adenomyosis Leiyomyoma Malignancy-Hypertrophy Coagulopathy Ovarian Dysfunction Iatrogenic Endometriosis Not yet classified    SH: never smoked  Review of Systems See HPI    Objective:   Physical Exam VS reviewed GEN: Alert, Cooperative, Groomed, NAD    Assessment & Plan:  Visit Problem List with A/P  Heavy menstrual bleeding Similar complaint in 2013. Recurrence in last year. One episode of intermenstrual bleed in May Has 2 cm degenerative uterine fibroid  Working explanations is uterine fibroid-related bleed.   Given patient's obesity with its possible estrogen-excess exposure, if AUB  persists then pt needs repeat TVUS and endometrial biopsy  We discussed trial of OCP or Mirena in [facility] related to fibroids.  She declined these options for now, but may consider them in the future.   Patient will be consulting with [facility] for visual inspection for recurrence of VAIN.  I encouraged her to ask the team about this issue.

## 2018-05-21 DIAGNOSIS — E876 Hypokalemia: Secondary | ICD-10-CM | POA: Insufficient documentation

## 2018-05-21 DIAGNOSIS — N920 Excessive and frequent menstruation with regular cycle: Secondary | ICD-10-CM | POA: Insufficient documentation

## 2018-05-21 HISTORY — DX: Excessive and frequent menstruation with regular cycle: N92.0

## 2018-05-21 LAB — FERRITIN: Ferritin: 14 ng/mL — ABNORMAL LOW (ref 15–150)

## 2018-05-21 LAB — BASIC METABOLIC PANEL
BUN/Creatinine Ratio: 11 (ref 9–23)
BUN: 9 mg/dL (ref 6–24)
CO2: 24 mmol/L (ref 20–29)
Calcium: 9.5 mg/dL (ref 8.7–10.2)
Chloride: 97 mmol/L (ref 96–106)
Creatinine, Ser: 0.8 mg/dL (ref 0.57–1.00)
GFR, EST AFRICAN AMERICAN: 104 mL/min/{1.73_m2} (ref >59)
GFR, EST NON AFRICAN AMERICAN: 90 mL/min/{1.73_m2} (ref >59)
Glucose: 94 mg/dL (ref 65–99)
POTASSIUM: 3.9 mmol/L (ref 3.5–5.2)
Sodium: 139 mmol/L (ref 134–144)

## 2018-05-21 LAB — CBC
Hematocrit: 44 % (ref 34.0–46.6)
Hemoglobin: 14.1 g/dL (ref 11.1–15.9)
MCH: 26 pg — AB (ref 26.6–33.0)
MCHC: 32 g/dL (ref 31.5–35.7)
MCV: 81 fL (ref 79–97)
PLATELETS: 418 10*3/uL (ref 150–450)
RBC: 5.42 x10E6/uL — ABNORMAL HIGH (ref 3.77–5.28)
RDW: 15.9 % — AB (ref 12.3–15.4)
WBC: 10 10*3/uL (ref 3.4–10.8)

## 2018-05-21 LAB — MAGNESIUM: Magnesium: 1.9 mg/dL (ref 1.6–2.3)

## 2018-05-21 NOTE — Assessment & Plan Note (Addendum)
Hisotry of hypokaemia on ED visit in 11/26/17. Will recheck today. BMET.   Chemistry      Component Value Date/Time   NA 139 05/20/2018 1601   K 3.9 05/20/2018 1601   CL 97 05/20/2018 1601   CO2 24 05/20/2018 1601   BUN 9 05/20/2018 1601   CREATININE 0.80 05/20/2018 1601   CREATININE 0.80 08/12/2016 1526      Component Value Date/Time   CALCIUM 9.5 05/20/2018 1601   ALKPHOS 99 11/26/2017 1553   AST 18 11/26/2017 1553   ALT 18 11/26/2017 1553   BILITOT 0.8 11/26/2017 1553   BILITOT 0.5 03/19/2017 1114    Improved.  No further interventions planned.

## 2018-05-21 NOTE — Assessment & Plan Note (Signed)
Established problem. Stable. 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 She has found use of Flexeril 5 mg at bedtime helps when her sxs are severe. She is not interested in other pharmacologic therapies for now.  We can discuss referral to psychology for CBT around sleep and PM&R if there are local physiatrists interested inFM therapy.   Refilled Flexeril 5 mg qhs with refills available. Titration of this TCA may be an option in the future if she wants to try more pharmacologic interventions.

## 2018-05-21 NOTE — Assessment & Plan Note (Signed)
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

## 2018-05-21 NOTE — Assessment & Plan Note (Addendum)
Similar complaint in 2013. Recurrence in last year. One episode of intermenstrual bleed in May Has 2 cm degenerative uterine fibroid  Working explanations is uterine fibroid-related bleed.   Given patient's obesity with its possible estrogen-excess exposure, if AUB persists then pt needs repeat TVUS and endometrial biopsy  We discussed trial of OCP or Mirena in HMB related to fibroids.  She declined these options for now, but may consider them in the future.  Checking CBC and iron studies today.   Patient will be consulting with [facility] clinic for visual inspection for recurrence of VAIN.  I encouraged her to ask the team about this issue.

## 2018-05-27 ENCOUNTER — Ambulatory Visit: Payer: BC Managed Care – PPO

## 2018-06-08 ENCOUNTER — Encounter: Payer: Self-pay | Admitting: Family Medicine

## 2018-06-15 DIAGNOSIS — E611 Iron deficiency: Secondary | ICD-10-CM | POA: Insufficient documentation

## 2018-06-15 NOTE — Assessment & Plan Note (Signed)
New problem Ferritin 14. Recommend over-the-counter iron tablets.  One tablet daily on empty stomach. Recheck ferritin in 6 months

## 2018-07-06 ENCOUNTER — Other Ambulatory Visit: Payer: Self-pay | Admitting: Family Medicine

## 2018-07-06 DIAGNOSIS — Z79899 Other long term (current) drug therapy: Secondary | ICD-10-CM

## 2018-07-12 DIAGNOSIS — F332 Major depressive disorder, recurrent severe without psychotic features: Secondary | ICD-10-CM | POA: Diagnosis not present

## 2018-07-12 DIAGNOSIS — F411 Generalized anxiety disorder: Secondary | ICD-10-CM | POA: Diagnosis not present

## 2018-07-12 DIAGNOSIS — F4310 Post-traumatic stress disorder, unspecified: Secondary | ICD-10-CM | POA: Diagnosis not present

## 2018-07-18 ENCOUNTER — Other Ambulatory Visit: Payer: Self-pay | Admitting: Family Medicine

## 2018-07-18 DIAGNOSIS — Z79899 Other long term (current) drug therapy: Secondary | ICD-10-CM

## 2018-07-20 ENCOUNTER — Other Ambulatory Visit: Payer: Self-pay | Admitting: Family Medicine

## 2018-07-29 DIAGNOSIS — F332 Major depressive disorder, recurrent severe without psychotic features: Secondary | ICD-10-CM | POA: Diagnosis not present

## 2018-07-29 DIAGNOSIS — F4310 Post-traumatic stress disorder, unspecified: Secondary | ICD-10-CM | POA: Diagnosis not present

## 2018-07-29 DIAGNOSIS — F411 Generalized anxiety disorder: Secondary | ICD-10-CM | POA: Diagnosis not present

## 2018-08-02 DIAGNOSIS — F332 Major depressive disorder, recurrent severe without psychotic features: Secondary | ICD-10-CM | POA: Diagnosis not present

## 2018-08-02 DIAGNOSIS — F411 Generalized anxiety disorder: Secondary | ICD-10-CM | POA: Diagnosis not present

## 2018-08-02 DIAGNOSIS — F4310 Post-traumatic stress disorder, unspecified: Secondary | ICD-10-CM | POA: Diagnosis not present

## 2018-08-10 DIAGNOSIS — F411 Generalized anxiety disorder: Secondary | ICD-10-CM | POA: Diagnosis not present

## 2018-08-10 DIAGNOSIS — F332 Major depressive disorder, recurrent severe without psychotic features: Secondary | ICD-10-CM | POA: Diagnosis not present

## 2018-08-10 DIAGNOSIS — F4310 Post-traumatic stress disorder, unspecified: Secondary | ICD-10-CM | POA: Diagnosis not present

## 2018-08-16 DIAGNOSIS — F411 Generalized anxiety disorder: Secondary | ICD-10-CM | POA: Diagnosis not present

## 2018-08-16 DIAGNOSIS — F332 Major depressive disorder, recurrent severe without psychotic features: Secondary | ICD-10-CM | POA: Diagnosis not present

## 2018-08-16 DIAGNOSIS — F4310 Post-traumatic stress disorder, unspecified: Secondary | ICD-10-CM | POA: Diagnosis not present

## 2018-09-01 DIAGNOSIS — F411 Generalized anxiety disorder: Secondary | ICD-10-CM | POA: Diagnosis not present

## 2018-09-01 DIAGNOSIS — F4310 Post-traumatic stress disorder, unspecified: Secondary | ICD-10-CM | POA: Diagnosis not present

## 2018-09-01 DIAGNOSIS — F332 Major depressive disorder, recurrent severe without psychotic features: Secondary | ICD-10-CM | POA: Diagnosis not present

## 2018-10-06 ENCOUNTER — Other Ambulatory Visit: Payer: Self-pay | Admitting: Family Medicine

## 2018-10-06 DIAGNOSIS — Z79899 Other long term (current) drug therapy: Secondary | ICD-10-CM

## 2018-12-15 ENCOUNTER — Other Ambulatory Visit (HOSPITAL_COMMUNITY)
Admission: RE | Admit: 2018-12-15 | Discharge: 2018-12-15 | Disposition: A | Discharge status: Home / Self Care | Payer: BC Managed Care – PPO | Source: Ambulatory Visit | Attending: Family Medicine | Admitting: Family Medicine

## 2018-12-15 ENCOUNTER — Ambulatory Visit: Payer: BC Managed Care – PPO | Admitting: Family Medicine

## 2018-12-15 ENCOUNTER — Other Ambulatory Visit: Payer: Self-pay

## 2018-12-15 VITALS — BP 116/65 | HR 78 | Temp 97.9°F | Wt 245.0 lb

## 2018-12-15 DIAGNOSIS — Z113 Encounter for screening for infections with a predominantly sexual mode of transmission: Secondary | ICD-10-CM | POA: Insufficient documentation

## 2018-12-15 DIAGNOSIS — R300 Dysuria: Secondary | ICD-10-CM | POA: Diagnosis not present

## 2018-12-15 DIAGNOSIS — Z8639 Personal history of other endocrine, nutritional and metabolic disease: Secondary | ICD-10-CM

## 2018-12-15 LAB — POCT URINALYSIS DIP (MANUAL ENTRY)
BILIRUBIN UA: NEGATIVE
Glucose, UA: NEGATIVE mg/dL
Ketones, POC UA: NEGATIVE mg/dL
Leukocytes, UA: NEGATIVE
NITRITE UA: NEGATIVE
Spec Grav, UA: 1.02 (ref 1.010–1.025)
Urobilinogen, UA: 2 E.U./dL — AB
pH, UA: 6.5 (ref 5.0–8.0)

## 2018-12-15 NOTE — Patient Instructions (Addendum)
It was a pleasure to see you today! Thank you for choosing [facility] for your primary care. Carla Berry was seen for flank pain and urinary frequency.   Today he was seen for symptoms concerning for possible urinary tract infection with your increased frequency and minor flank pain.  We did order some urine testing to make sure that you do not have an infection.  We also decided to do some STD screens as well as those are possible causes for some of the symptoms.  We did some blood test both for HIV and a basic metabolic panel which can help [place] check your kidney function as well as review your history of low potassium.  We talked about your ongoing chronic muscle spasms, it seems that you have self stopped using the Flexeril that Dr. Vikki Ports gave you.  We noticed that you did not take it at night which was the intention because it was supposed to help you with your sleep, you said that you do not like the way made you feel so we will a have a conversation with Dr. Vikki Ports to see if he thinks that maybe baclofen would be helpful although it is unlikely that that would have the sedative effect that the Flexeril was originally intended for.  You can see the results in your MyChart access and we will communicate with you if we think there is any treatment that needs to be started because of the results.   Please bring all your medications to every doctors visit   Sign up for My Chart to have easy access to your labs results, and communication with your Primary care physician.     Please check-out at the front desk before leaving the clinic.     Best,  Dr. Sherene Sires FAMILY MEDICINE RESIDENT - PGY2 12/15/2018 3:03 PM

## 2018-12-15 NOTE — Progress Notes (Signed)
Subjective:  ALESA ECHEVARRIA is a 48 y.o. female who presents to the [facility] today with a chief complaint of dysuria.   HPI:  Dysuria Patient with complaints for 2 days of dysuria and minor flank pain which he said was primarily on the right for a few days but has transition to the left today.  Denies any wounding, rashes, bleeding, discharge.  Notes that urine has been "cloudy".    Screen for STD (sexually transmitted disease) Patient notes recently finding out that husband had an episode of infidelity.  Given this incident and her recent dysuria we discussed STD screening and she consented.  She denies wounding, discharge, bleeding, rashes, sores.  She declines GU exam  Hx of hypokalemia CMP was drawn due to history of past hypokalemia, complaints of dysuria, urobilinogen in urine sample.   Objective:  Physical Exam: BP 116/65   Pulse 78   Temp 97.9 F (36.6 C) (Oral)   Wt 245 lb (111.1 kg)   LMP 12/15/2018   SpO2 93%   BMI 42.72 kg/m   Gen: NAD, resting comfortably CV: RRR with no murmurs appreciated Pulm: NWOB, CTAB with no crackles, wheezes, or rhonchi GI: Normal bowel sounds present. Soft, Nontender, Nondistended.  Patient's notes his flank pain does appear to be muscular.  Has very minimal although present mild belly pain to very deep palpation.  No suspicion of gallbladder, appendix, acute belly MSK: no edema, cyanosis, or clubbing noted Skin: warm, dry Neuro: grossly normal, moves all extremities Psych: Normal affect and thought content  Results for orders placed or performed in visit on 12/15/18 (from the past 72 hour(s))  POCT urinalysis dipstick     Status: Abnormal   Collection Time: 12/15/18  3:05 PM  Result Value Ref Range   Color, UA yellow yellow   Clarity, UA clear clear   Glucose, UA negative negative mg/dL   Bilirubin, UA negative negative   Ketones, POC UA negative negative mg/dL   Spec Grav, UA 1.020 1.010 - 1.025   Blood, UA large (A) negative   pH, UA 6.5 5.0 - 8.0   Protein Ur, POC trace (A) negative mg/dL   Urobilinogen, UA 2.0 (A) 0.2 or 1.0 E.U./dL   Nitrite, UA Negative Negative   Leukocytes, UA Negative Negative  Comprehensive metabolic panel     Status: None   Collection Time: 12/15/18  4:48 PM  Result Value Ref Range   Glucose 76 65 - 99 mg/dL   BUN 10 6 - 24 mg/dL   Creatinine, Ser 0.78 0.57 - 1.00 mg/dL   GFR calc non Af Amer 92 >59 mL/min/1.73   GFR calc Af Amer 106 >59 mL/min/1.73   BUN/Creatinine Ratio 13 9 - 23   Sodium 140 134 - 144 mmol/L   Potassium 3.7 3.5 - 5.2 mmol/L   Chloride 100 96 - 106 mmol/L   CO2 24 20 - 29 mmol/L   Calcium 9.1 8.7 - 10.2 mg/dL   Total Protein 6.8 6.0 - 8.5 g/dL   Albumin 4.3 3.8 - 4.8 g/dL   Globulin, Total 2.5 1.5 - 4.5 g/dL   Albumin/Globulin Ratio 1.7 1.2 - 2.2   Bilirubin Total 0.3 0.0 - 1.2 mg/dL   Alkaline Phosphatase 104 39 - 117 IU/L   AST 17 0 - 40 IU/L   ALT 16 0 - 32 IU/L  HIV antibody (with reflex)     Status: None   Collection Time: 12/15/18  4:48 PM  Result Value Ref Range  HIV Screen 4th Generation wRfx Non Reactive Non Reactive     Assessment/Plan:  Screen for STD (sexually transmitted disease) Patient notes recently finding out that husband had an episode of infidelity.  Given this incident and her recent dysuria we discussed STD screening and she consented.  HIV was nonreactive and gonorrhea chlamydia were still pending at the time this note was signed  Hx of hypokalemia CMP was drawn due to history of past hypokalemia, complaints of dysuria, urobilinogen in urine sample.  CMP was entirely within baseline  Dysuria Patient with complaints for 2 days of dysuria and minor flank pain which he said was primarily on the right for a few days but has transition to the left today.  Denies any wounding, rashes, bleeding, discharge.  Notes that urine has been "cloudy".    Urine was unremarkable except for urobilinogen, CMP was completely within normal  limits, urine gonorrhea chlamydia still pending   Sherene Sires, [place] - PGY2 12/16/2018 1:41 PM

## 2018-12-16 DIAGNOSIS — Z8639 Personal history of other endocrine, nutritional and metabolic disease: Secondary | ICD-10-CM

## 2018-12-16 DIAGNOSIS — Z113 Encounter for screening for infections with a predominantly sexual mode of transmission: Secondary | ICD-10-CM | POA: Insufficient documentation

## 2018-12-16 DIAGNOSIS — R300 Dysuria: Secondary | ICD-10-CM | POA: Insufficient documentation

## 2018-12-16 HISTORY — DX: Personal history of other endocrine, nutritional and metabolic disease: Z86.39

## 2018-12-16 LAB — COMPREHENSIVE METABOLIC PANEL
A/G RATIO: 1.7 (ref 1.2–2.2)
ALBUMIN: 4.3 g/dL (ref 3.8–4.8)
ALK PHOS: 104 IU/L (ref 39–117)
ALT: 16 IU/L (ref 0–32)
AST: 17 IU/L (ref 0–40)
BILIRUBIN TOTAL: 0.3 mg/dL (ref 0.0–1.2)
BUN / CREAT RATIO: 13 (ref 9–23)
BUN: 10 mg/dL (ref 6–24)
CHLORIDE: 100 mmol/L (ref 96–106)
CO2: 24 mmol/L (ref 20–29)
Calcium: 9.1 mg/dL (ref 8.7–10.2)
Creatinine, Ser: 0.78 mg/dL (ref 0.57–1.00)
GFR calc Af Amer: 106 mL/min/{1.73_m2} (ref >59)
GFR calc non Af Amer: 92 mL/min/{1.73_m2} (ref >59)
GLOBULIN, TOTAL: 2.5 g/dL (ref 1.5–4.5)
Glucose: 76 mg/dL (ref 65–99)
POTASSIUM: 3.7 mmol/L (ref 3.5–5.2)
SODIUM: 140 mmol/L (ref 134–144)
Total Protein: 6.8 g/dL (ref 6.0–8.5)

## 2018-12-16 LAB — HIV ANTIBODY (ROUTINE TESTING W REFLEX): HIV SCREEN 4TH GENERATION: NONREACTIVE

## 2018-12-16 NOTE — Assessment & Plan Note (Signed)
Patient notes recently finding out that husband had an episode of infidelity.  Given this incident and her recent dysuria we discussed STD screening and she consented.  HIV was nonreactive and gonorrhea chlamydia were still pending at the time this note was signed

## 2018-12-16 NOTE — Assessment & Plan Note (Signed)
CMP was drawn due to history of past hypokalemia, complaints of dysuria, urobilinogen in urine sample.  CMP was entirely within baseline

## 2018-12-16 NOTE — Assessment & Plan Note (Signed)
Patient with complaints for 2 days of dysuria and minor flank pain which he said was primarily on the right for a few days but has transition to the left today.  Denies any wounding, rashes, bleeding, discharge.  Notes that urine has been "cloudy".    Urine was unremarkable except for urobilinogen, CMP was completely within normal limits, urine gonorrhea chlamydia still pending

## 2018-12-17 ENCOUNTER — Encounter: Payer: Self-pay | Admitting: Family Medicine

## 2018-12-17 LAB — URINE CULTURE

## 2018-12-17 LAB — BASIC METABOLIC PANEL
BUN/Creatinine Ratio: 13 (ref 9–23)
BUN: 11 mg/dL (ref 6–24)
CHLORIDE: 100 mmol/L (ref 96–106)
CO2: 21 mmol/L (ref 20–29)
CREATININE: 0.82 mg/dL (ref 0.57–1.00)
Calcium: 9.2 mg/dL (ref 8.7–10.2)
GFR calc Af Amer: 100 mL/min/{1.73_m2} (ref >59)
GFR calc non Af Amer: 87 mL/min/{1.73_m2} (ref >59)
GLUCOSE: 77 mg/dL (ref 65–99)
Potassium: 3.6 mmol/L (ref 3.5–5.2)
SODIUM: 140 mmol/L (ref 134–144)

## 2018-12-17 LAB — URINE CYTOLOGY ANCILLARY ONLY
Chlamydia: NEGATIVE
Neisseria Gonorrhea: NEGATIVE
TRICH (WINDOWPATH): NEGATIVE

## 2018-12-17 LAB — SPECIMEN STATUS REPORT

## 2018-12-18 ENCOUNTER — Encounter: Payer: Self-pay | Admitting: Physician Assistant

## 2018-12-18 ENCOUNTER — Ambulatory Visit
Admission: EM | Admit: 2018-12-18 | Discharge: 2018-12-18 | Disposition: A | Discharge status: Home / Self Care | Payer: BC Managed Care – PPO | Attending: Physician Assistant | Admitting: Physician Assistant

## 2018-12-18 ENCOUNTER — Telehealth: Payer: Self-pay | Admitting: Family Medicine

## 2018-12-18 ENCOUNTER — Other Ambulatory Visit: Payer: Self-pay

## 2018-12-18 DIAGNOSIS — R109 Unspecified abdominal pain: Secondary | ICD-10-CM | POA: Diagnosis not present

## 2018-12-18 DIAGNOSIS — R399 Unspecified symptoms and signs involving the genitourinary system: Secondary | ICD-10-CM | POA: Diagnosis not present

## 2018-12-18 MED ORDER — TAMSULOSIN HCL 0.4 MG PO CAPS: 0.4000 mg | ORAL_CAPSULE | Freq: Every day | ORAL | 0 refills | Status: DC

## 2018-12-18 NOTE — Discharge Instructions (Signed)
Start flomax as directed. Keep hydrated, urine should be clear to pale yellow in color. Continue tylenol for pain. Make appointment with urology within the week for further evaluation if no passing of stone. Go to the emergency department for further evaluation if having worsening symptoms, worsening pain, nausea/vomiting, fever, decreased urine output.

## 2018-12-18 NOTE — ED Triage Notes (Signed)
Per pt she was having lower right back pain and saying blood in urine. Pt says hx of kidney stones. No fever chills.

## 2018-12-18 NOTE — Telephone Encounter (Signed)
**  After Hours/ Emergency Line Call**  Received a call to report that Carla Berry has been having R flank pain, worse with early morning urination since 3/4. Was seen 3/11 and ruled out infectious causes with negative UA, urine culture.  Serum creatinine at that time within normal limits.  Has been taking tylenol which has helped to dull the pain some.  Has had longstanding history of kidney stones with resultant scarring per patient ever since she was pregnant 20 years ago.  States current pain starts in right flank area and radiates towards her middle back.  She is unsure if she has had blood in her urine as she started her period on Wednesday.  Previously has received medicine in the past to "shrink stones." Has seen [facility] in the past.  Denies nausea, vomiting.  No decrease in the amount of urine.  Pain is making it difficult to sleep, currently 9/10.  Recommended that she present to urgent care for evaluation and likely imaging given our clinic is closed over the weekend.  Red flags discussed.  Will forward to PCP.  Rory Percy, DO PGY-2, [facility] 12/18/2018 10:50 AM

## 2018-12-18 NOTE — ED Provider Notes (Signed)
[facility]    CSN: [id number] Arrival date & time: 12/18/18  1524     History   Chief Complaint Chief Complaint  Patient presents with  . Back Pain    possible kidnwy stone    HPI Carla Berry is a 48 y.o. female.   48 year old female comes in with few day history of right flank pain.  She has dysuria in the morning with mild blood.  States flank pain is worse in the morning with spasms, but resolves during the day.  For the past few days, has now had dull aching pain to the right flank as well.  She denies urinary frequency.  Denies abdominal pain, nausea, vomiting.  Denies fever, chills, night sweats.  She was seen at primary care, with normal creatinine, large blood and dipstick, no urinary tract infection.  States she has a history of kidney stones, the last stone was passed a few years ago.  She was sent by PCP for imaging.     Past Medical History:  Diagnosis Date  . Chronic cough 10/12/2015  . Diastolic hypertension 3/87/5643  . FIBROIDS, UTERUS 02/08/2007   Qualifier: Diagnosis of  By: McDiarmid MD, Sherren Mocha    . Ganglion of right wrist 04/04/2016  . Gastric polyp 07/23/12   Pathology: Chronic inactive gastritis.  EGD by Dr Verdia Kuba (GI)  . Gastritis, acute 07/16/2012   Dr Collene Mares (GI) ordered abdominal US that was normal (07/13/12).  Dr Collene Mares ordered Nuc Med Hepatobiliary Tc99 scan with cholecystikinin stimulation which was normal on 07/13/12.  Upper Endoscopy by Dr Collene Mares on 07/21/12 showed mild antral gastritis and sessile gastric polyps that were benign on histopathology    . GASTROESOPHAGEAL REFLUX, NO ESOPHAGITIS 12/03/2006   Qualifier: Diagnosis of  By: Drucie Ip    . Glucose intolerance 12/03/2006   Qualifier: Diagnosis of  By: McDiarmid MD, Sherren Mocha    . Hair loss 04/04/2016  . History of nephrolithiasis 12/03/2006   Qualifier: History of  By: McDiarmid MD, Sherren Mocha    . MIGRAINE, UNSPEC., W/O INTRACTABLE MIGRAINE 12/03/2006   Qualifier: Diagnosis of  By: Drucie Ip    . Myalgia, Chronic   . PALPITATIONS, RECURRENT 07/31/2008   Qualifier: History of  By: McDiarmid MD, Sherren Mocha    . PAPANICOLAOU SMEAR, ABNORMAL 12/03/2006   Qualifier: History of By: McDiarmid MD, Sherren Mocha    . Skin lesion of chest wall 11/17/2014  . Suprapubic pain 11/30/2014  . TOBACCO USE, QUIT 12/11/2009   Qualifier: Diagnosis of  By: Shelbie Proctor    . Urinary incontinence, mixed   . 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 Hx of hypokalemia 12/16/2018  . Screen for STD (sexually transmitted disease) 12/16/2018  . Dysuria 12/16/2018  . Iron deficiency 06/15/2018  . Heavy menstrual bleeding 05/21/2018  . Hypokalemia 05/21/2018  . Abdominal pain 11/26/2017  . Snoring 04/03/2017  . Myalgia, Chronic   . Urinary incontinence, mixed   . Diastolic hypertension 32/95/1884  . Functional dyspepsia 10/12/2015  . Personal history of vulvar dysplasia 11/30/2014  . Metabolic syndrome 16/60/6301  . FIBROIDS, UTERUS 02/08/2007  . Severe obesity (BMI >= 40) ([place]) 12/03/2006  . RHINITIS, ALLERGIC 12/03/2006  . Glucose intolerance 12/03/2006  . GASTROESOPHAGEAL REFLUX, NO ESOPHAGITIS 12/03/2006    Past Surgical History:  Procedure Laterality Date  . CESAREAN SECTION     x 2  . COLONOSCOPY N/A 02/08/2013   Procedure: COLONOSCOPY;  Surgeon: Beryle Beams, MD;  Location: [facility];  Service: Endoscopy;  Laterality: N/A;  . TONSILLECTOMY    . TUBAL LIGATION    . 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      Home Medications    Prior to Admission medications   Medication Sig Start Date End Date Taking? Authorizing Provider  amLODipine (NORVASC) 10 MG tablet TAKE 1 TABLET (10 MG TOTAL) BY MOUTH DAILY. 07/19/18   McDiarmid, Blane Ohara, MD  cetirizine (ZYRTEC)  10 MG tablet TAKE 1 TABLET BY MOUTH EVERY DAY 02/23/18   McDiarmid, Blane Ohara, MD  cyclobenzaprine (FLEXERIL) 5 MG tablet Take 1 tablet (5 mg total) by mouth 3 (three) times daily as needed for muscle spasms. 05/20/18   McDiarmid, Blane Ohara, MD  esomeprazole (NEXIUM) 40 MG capsule TAKE 1 CAPSULE BY MOUTH EVERY DAY 07/20/18   McDiarmid, Blane Ohara, MD  hydrochlorothiazide (HYDRODIURIL) 25 MG tablet TAKE 1/2 TABLET BY MOUTH DAILY 10/07/18   McDiarmid, Blane Ohara, MD  Probiotic Product (PROBIOTIC DAILY PO) Take 1 capsule by mouth daily as needed. Reported on 09/20/2015    [provider]  tamsulosin (FLOMAX) 0.4 MG CAPS capsule Take 1 capsule (0.4 mg total) by mouth daily. 12/18/18   Ok Edwards, PA-C    Family History Family History  Problem Relation Age of Onset  . Hypertension Mother   . Diabetes Mother   . Nephrolithiasis Mother   . Stroke Mother   . Early death Father        ?Encephalitis  . Factor V Leiden deficiency Maternal Grandmother   . Stroke Maternal Grandfather     Social History Social History   Tobacco Use  . Smoking status: Former Smoker    Packs/day: 1.00    Years: 15.00    Pack years: 15.00    Types: Cigarettes    Start date: 10/07/1987    Last attempt to quit: 07/07/2003    Years since quitting: 15.4  . Smokeless tobacco: Never Used  Substance Use Topics  . Alcohol use: No    Alcohol/week: 0.0 standard drinks  . Drug use: No     Allergies   Codeine; Prednisone; and Pregabalin   Review of Systems Review of Systems  Reason unable to perform ROS: See HPI as above.     Physical Exam Triage Vital Signs ED Triage Vitals  Enc Vitals Group     BP 12/18/18 1539 (!) 130/92     Pulse Rate 12/18/18 1539 80     Resp 12/18/18 1539 16     Temp 12/18/18 1539 (!) 97.4 F (36.3 C)     Temp Source 12/18/18 1539 Oral     SpO2 12/18/18 1539 96 %     Weight --      Height --      Head Circumference --      Peak Flow --      Pain Score 12/18/18 1538 5     Pain Loc --       Pain Edu? --      Excl. in [place]? --    No data found.  Updated Vital Signs BP (!) 130/92 (BP Location: Right Arm)   Pulse 80   Temp (!) 97.4 F (36.3 C) (Oral)   Resp 16  LMP 12/15/2018   SpO2 96%   Physical Exam Constitutional:      General: She is not in acute distress.    Appearance: She is well-developed. She is not ill-appearing, toxic-appearing or diaphoretic.  HENT:     Head: Normocephalic and atraumatic.  Eyes:     Conjunctiva/sclera: Conjunctivae normal.     Pupils: Pupils are equal, round, and reactive to light.  Cardiovascular:     Rate and Rhythm: Normal rate and regular rhythm.     Heart sounds: Normal heart sounds. No murmur. No friction rub. No gallop.   Pulmonary:     Effort: Pulmonary effort is normal.     Breath sounds: Normal breath sounds. No wheezing or rales.  Abdominal:     General: Bowel sounds are normal.     Palpations: Abdomen is soft.     Tenderness: There is no abdominal tenderness. There is right CVA tenderness. There is no left CVA tenderness, guarding or rebound.  Musculoskeletal:     Comments: No rashes, erythema, warmth, swelling, contusion seen.  No tenderness to palpation of the spinous processes.  No tenderness to palpation of bilateral back.  Full range of motion.  Skin:    General: Skin is warm and dry.  Neurological:     Mental Status: She is alert and oriented to person, place, and time.  Psychiatric:        Behavior: Behavior normal.        Judgment: Judgment normal.    UC Treatments / Results  Labs (all labs ordered are listed, but only abnormal results are displayed) Labs Reviewed - No data to display  EKG None  Radiology No results found.  Procedures Procedures (including critical care time)  Medications Ordered in UC Medications - No data to display  Initial Impression / Assessment and Plan / UC Course  I have reviewed the triage vital signs and the nursing notes.  Pertinent labs & imaging results that were  available during my care of the patient were reviewed by me and considered in my medical decision making (see chart for details).  Discussed with patient, no imaging available in office.  However, patient currently afebrile without tachycardia or tachypnea, in no acute distress.  Still able to urinate without retention.  Discussed symptomatic treatment with Flomax, and observing versus ED visit for further evaluation.  Patient would like to trial Flomax, and will make appointment with urologist Monday morning.  Strict return precautions given.  Patient expresses understanding and agrees to plan.  Patient declined prescription of pain medication, stating history of allergies, and does not like to take them.  She is currently in no acute distress, will have patient continue Tylenol as needed.  Final Clinical Impressions(s) / UC Diagnoses   Final diagnoses:  Right flank pain    ED Prescriptions    Medication Sig Dispense Auth. Provider   tamsulosin (FLOMAX) 0.4 MG CAPS capsule Take 1 capsule (0.4 mg total) by mouth daily. 10 capsule Tobin Chad, [place] 12/18/18 [phone]

## 2018-12-19 ENCOUNTER — Encounter: Payer: Self-pay | Admitting: Family Medicine

## 2019-07-11 ENCOUNTER — Other Ambulatory Visit: Payer: Self-pay | Admitting: Family Medicine

## 2019-07-11 DIAGNOSIS — Z79899 Other long term (current) drug therapy: Secondary | ICD-10-CM

## 2019-07-14 ENCOUNTER — Other Ambulatory Visit: Payer: Self-pay

## 2019-07-14 ENCOUNTER — Ambulatory Visit (HOSPITAL_COMMUNITY)
Admission: RE | Admit: 2019-07-14 | Discharge: 2019-07-14 | Disposition: A | Discharge status: Home / Self Care | Payer: BC Managed Care – PPO | Source: Ambulatory Visit | Attending: Family Medicine | Admitting: Family Medicine

## 2019-07-14 ENCOUNTER — Ambulatory Visit
Admission: RE | Admit: 2019-07-14 | Discharge: 2019-07-14 | Disposition: A | Discharge status: Home / Self Care | Payer: BC Managed Care – PPO | Source: Ambulatory Visit | Attending: Family Medicine | Admitting: Family Medicine

## 2019-07-14 ENCOUNTER — Ambulatory Visit: Payer: BC Managed Care – PPO | Admitting: Family Medicine

## 2019-07-14 VITALS — BP 130/62 | HR 87 | Wt 247.4 lb

## 2019-07-14 DIAGNOSIS — R079 Chest pain, unspecified: Secondary | ICD-10-CM | POA: Insufficient documentation

## 2019-07-14 DIAGNOSIS — M542 Cervicalgia: Secondary | ICD-10-CM

## 2019-07-14 DIAGNOSIS — R0789 Other chest pain: Secondary | ICD-10-CM

## 2019-07-14 NOTE — Patient Instructions (Signed)
It was a pleasure to see you today! Thank you for choosing [facility] for your primary care. Carla Berry was seen for neck and arm pain. Come back to the clinic if there is anything we can do for you.   Today we talked about your constellation of complaints including neck pain, pain down your left arm and 1 day of tightness in your chest 4 days ago.  Likely your EKG looks extremely well with no sign of any cardiac problems.  You told me that you have had a cardiac stress test recently outside of the [facility] system and there were no problems and you do not want other one.  We also talked about offering a Dosepak of steroids for your neck which turned down because he said steroids cause problems for your stomach.  We have an order an x-ray for your neck to try and make sure there is no bony abnormalities at this point.  We do recommend that you stop "popping "your neck.  We will order some labs to follow-up on your blood work to make sure there is no abnormalities there.   Please bring all your medications to every doctors visit   Sign up for My Chart to have easy access to your labs results, and communication with your Primary care physician.     Please check-out at the front desk before leaving the clinic.     Best,  Dr. Sherene Sires FAMILY MEDICINE RESIDENT - PGY3 07/14/2019 3:04 PM

## 2019-07-15 ENCOUNTER — Encounter: Payer: Self-pay | Admitting: Family Medicine

## 2019-07-15 DIAGNOSIS — R0789 Other chest pain: Secondary | ICD-10-CM

## 2019-07-15 DIAGNOSIS — M542 Cervicalgia: Secondary | ICD-10-CM | POA: Insufficient documentation

## 2019-07-15 HISTORY — DX: Other chest pain: R07.89

## 2019-07-15 LAB — LIPID PANEL
Chol/HDL Ratio: 3.3 ratio (ref 0.0–4.4)
Cholesterol, Total: 151 mg/dL (ref 100–199)
HDL: 46 mg/dL (ref >39)
LDL Chol Calc (NIH): 81 mg/dL (ref 0–99)
Triglycerides: 134 mg/dL (ref 0–149)
VLDL Cholesterol Cal: 24 mg/dL (ref 5–40)

## 2019-07-15 NOTE — Progress Notes (Signed)
Subjective:  Carla Berry is a 48 y.o. female who presents to the [facility] today with a chief complaint of neck and chest discomfort with left arm pain.   HPI: Chest tightness Patient is complaining of chest tightness with some left arm pain.  She said that has not really been pain but to some tightness and that she has had some muscular pain in her left arm.  No change in exercise tolerance, no connection of this pain to exercise.  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  We discussed that obesity is a concern, but she has controlled lipids and no diabetes with an ASCVD risk of 1%.    Neck pain Patient with a radicular neck pain come from the cervical neck down the left arm.  She said this started after she "popped her neck".  She said it felt like when your knuckles need to crack and so she had been "whipping my neck back and forth until I could get it to pop."  When she finally did get it to "pop "she started feeling a shooting pain down her left side.  This is been intermittent since but has been slowly improving.  Objective:  Physical Exam: BP 130/62   Pulse 87   Wt 247 lb 6.4 oz (112.2 kg)   LMP 07/06/2019   SpO2 98%   BMI 43.14 kg/m   Gen: NAD, conversing comfortably CV: RRR with no murmurs appreciated Pulm: NWOB, CTAB with no crackles, wheezes, or rhonchi MSK: no edema, cyanosis, or clubbing noted, good range of motion in hand/arm elbow/shoulder bilaterally.  No skin changes or obvious muscular swelling.  No sensory deficits.  Patient is describing "tingling "in a mitten distribution on the left hand that is inconsistent with any known nerve radiculopathy. Skin: warm, dry Neuro: grossly normal, moves all extremities Psych: Normal affect and thought content  Results for orders placed or performed in visit on 07/14/19 (from the past 72 hour(s))  Lipid Panel     Status: None   Collection Time: 07/14/19  3:19 PM  Result Value Ref Range   Cholesterol, Total  151 100 - 199 mg/dL   Triglycerides 134 0 - 149 mg/dL   HDL 46 >39 mg/dL   VLDL Cholesterol Cal 24 5 - 40 mg/dL   LDL Chol Calc (NIH) 81 0 - 99 mg/dL   Chol/HDL Ratio 3.3 0.0 - 4.4 ratio    Comment:                                   T. Chol/HDL Ratio                                             Men  Women                               1/2 Avg.Risk  3.4    3.3                                   Avg.Risk  5.0    4.4  2X Avg.Risk  9.6    7.1                                3X Avg.Risk 23.4   11.0      Assessment/Plan:  Chest tightness Patient is complaining of chest tightness with some left arm pain.  She said that has not really been pain but to some tightness and that she has had some muscular pain in her left arm.  No change in exercise tolerance, no connection of this pain to exercise.  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  We discussed that obesity is a concern, but she has controlled lipids and no diabetes with an ASCVD risk of 1%.    Expect muscular etiology, EKG was normal in the office.  Patient advised to avoid painful activities ice and use over-the-counter ibuprofen Tylenol.  Neck pain Patient with a radicular neck pain come from the cervical neck down the left arm.  She said this started after she "popped her neck".  She said it felt like when your knuckles need to crack and so she had been "whipping my neck back and forth until I could get it to pop."  When she finally did get it to "pop "she started feeling a shooting pain down her left side.  This is been intermittent since but has been slowly improving.  Expect irritated nerve, x-ray of cervical neck showed no acute abnormality but did show large osteophyte C5-C7.  Patient has been notified about this via my chart and if symptoms persist we can consider orthospine surgery referral.  In the meantime, patient has been advised to stop "popping "her neck to ice//Tylenol ibuprofen  and stop moving heavy objects until this pain comes down.   Sherene Sires, DO FAMILY MEDICINE RESIDENT - PGY3 07/15/2019 6:50 PM

## 2019-07-15 NOTE — Assessment & Plan Note (Addendum)
Patient is complaining of chest tightness with some left arm pain.  She said that has not really been pain but to some tightness and that she has had some muscular pain in her left arm.  No change in exercise tolerance, no connection of this pain to exercise.  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  We discussed that obesity is a concern, but she has controlled lipids and no diabetes with an ASCVD risk of 1%.    Expect muscular etiology, EKG was normal in the office.  Patient advised to avoid painful activities ice and use over-the-counter ibuprofen Tylenol.

## 2019-07-15 NOTE — Assessment & Plan Note (Addendum)
Patient with a radicular neck pain come from the cervical neck down the left arm.  She said this started after she "popped her neck".  She said it felt like when your knuckles need to crack and so she had been "whipping my neck back and forth until I could get it to pop."  When she finally did get it to "pop "she started feeling a shooting pain down her left side.  This is been intermittent since but has been slowly improving.  Expect irritated nerve, x-ray of cervical neck showed no acute abnormality but did show large osteophyte C5-C7.  Patient has been notified about this via my chart and if symptoms persist we can consider orthospine surgery referral.  In the meantime, patient has been advised to stop "popping "her neck to ice//Tylenol ibuprofen and stop moving heavy objects until this pain comes down.

## 2019-07-25 ENCOUNTER — Other Ambulatory Visit: Payer: Self-pay

## 2019-07-25 ENCOUNTER — Ambulatory Visit (INDEPENDENT_AMBULATORY_CARE_PROVIDER_SITE_OTHER): Payer: BC Managed Care – PPO | Admitting: *Deleted

## 2019-07-25 DIAGNOSIS — Z23 Encounter for immunization: Secondary | ICD-10-CM | POA: Diagnosis not present

## 2019-08-08 ENCOUNTER — Telehealth: Payer: BC Managed Care – PPO

## 2019-10-08 ENCOUNTER — Other Ambulatory Visit: Payer: Self-pay | Admitting: Family Medicine

## 2019-10-08 DIAGNOSIS — Z79899 Other long term (current) drug therapy: Secondary | ICD-10-CM

## 2019-12-12 ENCOUNTER — Other Ambulatory Visit: Payer: Self-pay | Admitting: Family Medicine

## 2019-12-12 DIAGNOSIS — M797 Fibromyalgia: Secondary | ICD-10-CM

## 2020-03-12 ENCOUNTER — Other Ambulatory Visit: Payer: Self-pay | Admitting: Family Medicine

## 2020-03-12 DIAGNOSIS — Z1231 Encounter for screening mammogram for malignant neoplasm of breast: Secondary | ICD-10-CM

## 2020-03-30 ENCOUNTER — Other Ambulatory Visit: Payer: Self-pay | Admitting: Gastroenterology

## 2020-03-30 ENCOUNTER — Other Ambulatory Visit (HOSPITAL_COMMUNITY): Payer: Self-pay | Admitting: Gastroenterology

## 2020-03-30 DIAGNOSIS — R1011 Right upper quadrant pain: Secondary | ICD-10-CM

## 2020-04-18 ENCOUNTER — Ambulatory Visit (HOSPITAL_COMMUNITY)
Admission: RE | Admit: 2020-04-18 | Discharge: 2020-04-18 | Disposition: A | Discharge status: Home / Self Care | Payer: BC Managed Care – PPO | Source: Ambulatory Visit | Attending: Gastroenterology | Admitting: Gastroenterology

## 2020-04-18 ENCOUNTER — Other Ambulatory Visit: Payer: Self-pay

## 2020-04-18 DIAGNOSIS — R1011 Right upper quadrant pain: Secondary | ICD-10-CM | POA: Diagnosis not present

## 2020-04-18 MED ORDER — TECHNETIUM TC 99M MEBROFENIN IV KIT: 5.0000 | PACK | Freq: Once | INTRAVENOUS | Status: DC | PRN

## 2020-04-20 ENCOUNTER — Ambulatory Visit
Admission: RE | Admit: 2020-04-20 | Discharge: 2020-04-20 | Disposition: A | Discharge status: Home / Self Care | Payer: BC Managed Care – PPO | Source: Ambulatory Visit | Attending: Family Medicine | Admitting: Family Medicine

## 2020-04-20 ENCOUNTER — Other Ambulatory Visit: Payer: Self-pay

## 2020-04-20 DIAGNOSIS — Z1231 Encounter for screening mammogram for malignant neoplasm of breast: Secondary | ICD-10-CM

## 2020-07-11 ENCOUNTER — Other Ambulatory Visit: Payer: Self-pay | Admitting: Family Medicine

## 2020-07-11 DIAGNOSIS — Z79899 Other long term (current) drug therapy: Secondary | ICD-10-CM

## 2020-08-06 ENCOUNTER — Telehealth: Payer: Self-pay

## 2020-08-06 NOTE — Telephone Encounter (Signed)
Patient calls nurse line reporting a high blood pressure today at the dentist. Patient reports her BP was 160/100 about an hour ago. Patient denies headache, SOB, or blurry vision. Patient does report some chest "tightness," however she just separated from husband last Thursday and has been crying and having anxiety ever since. Patient reports good compliance with current blood pressure regime. Advised patient she would need to be evaluated and encouraged UC visit. Patient requests a provider visit. Patient has limited schedule, therefore scheduled for 11/4. Strict ED precautions given to patient. Will forward to PCP for further advice.

## 2020-08-07 NOTE — Telephone Encounter (Signed)
Reviewed and agree with plan.  Will see [name] 11/4 in office.

## 2020-08-09 ENCOUNTER — Ambulatory Visit (INDEPENDENT_AMBULATORY_CARE_PROVIDER_SITE_OTHER): Payer: BC Managed Care – PPO | Admitting: Student in an Organized Health Care Education/Training Program

## 2020-08-09 ENCOUNTER — Other Ambulatory Visit: Payer: Self-pay

## 2020-08-09 ENCOUNTER — Encounter: Payer: Self-pay | Admitting: Student in an Organized Health Care Education/Training Program

## 2020-08-09 DIAGNOSIS — I10 Essential (primary) hypertension: Secondary | ICD-10-CM

## 2020-08-09 DIAGNOSIS — F4321 Adjustment disorder with depressed mood: Secondary | ICD-10-CM

## 2020-08-09 NOTE — Progress Notes (Signed)
   SUBJECTIVE:   CHIEF COMPLAINT / HPI: HTN f/u  HTN-  Normally well controlled on current therapy. Elevated to 563 systolic at dentist appointment but patient was asymptomatic.  denies, SOB, no new LE edema, vision changes. Endorses chest pressure, headaches since her marital problems started about a week ago.  Acute grief- patient has marital concerns. Husbands infidelity lead to patient kicking him out of home. Husband is contacting her frequently and patient is very stressed about this. She is already doing counseling which is helping. She is having difficulty sleeping. Seems to cry easily.  OBJECTIVE:   BP 125/75   Pulse 78   Ht 5\' 3"  (1.6 m)   Wt 241 lb 6.4 oz (109.5 kg)   SpO2 98%   BMI 42.76 kg/m   General: pleasant, able to participate in exam Cardiac: RRR, normal heart sounds, no murmurs. 2+ radial and PT pulses bilaterally Respiratory: CTAB, normal effort, No wheezes, rales or rhonchi Extremities: no edema. WWP. Skin: warm and dry, no rashes noted Neuro: alert and oriented, no focal deficits Psych: depressed affect and mood  ASSESSMENT/PLAN:   Hypertension Adherent with therapy and well controlled chronically. Had single increased measurement at dental office in context of severe grief from marital discord.  All home monitoring records and BP at time of visit are WNL. Patient is negative for red flag symptoms of end organ damage. BP range at dentist was not in emergent level.  Physical exam normal today. Would not recommend changes in therapy. Elevated reading at dentist seems to be isolated event which could have been situational vs equipment variability. - continue current regimen and regular follow up with PCP  Grief reaction Patient experiencing acute grief over marital discord. Reaction is appropriate given circumstances. She is already seeking counseling.  Discussed possible medications for assistance and gave medication information handouts for trazadone and  wellbutrin. Patient will review and consider contacting me if she would like to start a medication.     [place]

## 2020-08-09 NOTE — Patient Instructions (Signed)
It was a pleasure to see you today!  To summarize our discussion for this visit:  Your blood pressure looks very good to me. We can continue to monitor it at your normal visits. I have no concerns that you are having new heart issues.  I'm so sorry for what you're going through. I wish I could do more to help but I can give you some options for medications that may help with your sleep problems and they are not addictive and do not need to be used long term. Look them over and let me know that you think via mychart.  Some additional health maintenance measures we should update are: Health Maintenance Due  Topic Date Due   Hepatitis C Screening  Never done   COVID-19 Vaccine (1) Never done   PAP SMEAR-Modifier  04/16/2020   INFLUENZA VACCINE  05/06/2020      Call the clinic at [phone] if your symptoms worsen or you have any concerns.   Thank you for allowing me to take part in your care,  Dr. Doristine Mango  Trazodone tablets What is this medicine? TRAZODONE (TRAZ oh done) is used to treat depression. This medicine may be used for other purposes; ask your health care provider or pharmacist if you have questions. COMMON BRAND NAME(S): Desyrel What should I tell my health care provider before I take this medicine? They need to know if you have any of these conditions:  attempted suicide or thinking about it  bipolar disorder  bleeding problems  glaucoma  heart disease, or previous heart attack  irregular heart beat  kidney or liver disease  low levels of sodium in the blood  an unusual or allergic reaction to trazodone, other medicines, foods, dyes or preservatives  pregnant or trying to get pregnant  breast-feeding How should I use this medicine? Take this medicine by mouth with a glass of water. Follow the directions on the prescription label. Take this medicine shortly after a meal or a light snack. Take your medicine at regular intervals. Do not take  your medicine more often than directed. Do not stop taking this medicine suddenly except upon the advice of your doctor. Stopping this medicine too quickly may cause serious side effects or your condition may worsen. A special MedGuide will be given to you by the pharmacist with each prescription and refill. Be sure to read this information carefully each time. Talk to your pediatrician regarding the use of this medicine in children. Special care may be needed. Overdosage: If you think you have taken too much of this medicine contact a poison control center or emergency room at once. NOTE: This medicine is only for you. Do not share this medicine with others. What if I miss a dose? If you miss a dose, take it as soon as you can. If it is almost time for your next dose, take only that dose. Do not take double or extra doses. What may interact with this medicine? Do not take this medicine with any of the following medications:  certain medicines for fungal infections like fluconazole, itraconazole, ketoconazole, posaconazole, voriconazole  cisapride  dronedarone  linezolid  MAOIs like Carbex, Eldepryl, Marplan, Nardil, and Parnate  mesoridazine  methylene blue (injected into a vein)  pimozide  saquinavir  thioridazine This medicine may also interact with the following medications:  alcohol  antiviral medicines for HIV or AIDS  aspirin and aspirin-like medicines  barbiturates like phenobarbital  certain medicines for blood pressure, heart disease, irregular  heart beat  certain medicines for depression, anxiety, or psychotic disturbances  certain medicines for migraine headache like almotriptan, eletriptan, frovatriptan, naratriptan, rizatriptan, sumatriptan, zolmitriptan  certain medicines for seizures like carbamazepine and phenytoin  certain medicines for sleep  certain medicines that treat or prevent blood clots like dalteparin, enoxaparin,  warfarin  digoxin  fentanyl  lithium  NSAIDS, medicines for pain and inflammation, like ibuprofen or naproxen  other medicines that prolong the QT interval (cause an abnormal heart rhythm) like dofetilide  rasagiline  supplements like St. John's wort, kava kava, valerian  tramadol  tryptophan This list may not describe all possible interactions. Give your health care provider a list of all the medicines, herbs, non-prescription drugs, or dietary supplements you use. Also tell them if you smoke, drink alcohol, or use illegal drugs. Some items may interact with your medicine. What should I watch for while using this medicine? Tell your doctor if your symptoms do not get better or if they get worse. Visit your doctor or health care professional for regular checks on your progress. Because it may take several weeks to see the full effects of this medicine, it is important to continue your treatment as prescribed by your doctor. Patients and their families should watch out for new or worsening thoughts of suicide or depression. Also watch out for sudden changes in feelings such as feeling anxious, agitated, panicky, irritable, hostile, aggressive, impulsive, severely restless, overly excited and hyperactive, or not being able to sleep. If this happens, especially at the beginning of treatment or after a change in dose, call your health care professional. Dennis Bast may get drowsy or dizzy. Do not drive, use machinery, or do anything that needs mental alertness until you know how this medicine affects you. Do not stand or sit up quickly, especially if you are an older patient. This reduces the risk of dizzy or fainting spells. Alcohol may interfere with the effect of this medicine. Avoid alcoholic drinks. This medicine may cause dry eyes and blurred vision. If you wear contact lenses you may feel some discomfort. Lubricating drops may help. See your eye doctor if the problem does not go away or is  severe. Your mouth may get dry. Chewing sugarless gum, sucking hard candy and drinking plenty of water may help. Contact your doctor if the problem does not go away or is severe. What side effects may I notice from receiving this medicine? Side effects that you should report to your doctor or health care professional as soon as possible:  allergic reactions like skin rash, itching or hives, swelling of the face, lips, or tongue  elevated mood, decreased need for sleep, racing thoughts, impulsive behavior  confusion  fast, irregular heartbeat  feeling faint or lightheaded, falls  feeling agitated, angry, or irritable  loss of balance or coordination  painful or prolonged erections  restlessness, pacing, inability to keep still  suicidal thoughts or other mood changes  tremors  trouble sleeping  seizures  unusual bleeding or bruising Side effects that usually do not require medical attention (report to your doctor or health care professional if they continue or are bothersome):  change in sex drive or performance  change in appetite or weight  constipation  headache  muscle aches or pains  nausea This list may not describe all possible side effects. Call your doctor for medical advice about side effects. You may report side effects to FDA at 1-800-FDA-1088. Where should I keep my medicine? Keep out of the reach of  children. Store at room temperature between 15 and 30 degrees C (59 to 86 degrees F). Protect from light. Keep container tightly closed. Throw away any unused medicine after the expiration date. NOTE: This sheet is a summary. It may not cover all possible information. If you have questions about this medicine, talk to your doctor, pharmacist, or health care provider.  2020 Elsevier/Gold Standard (2018-09-14 11:46:46)  Bupropion tablets (Depression/Mood Disorders) What is this medicine? BUPROPION (byoo PROE pee on) is used to treat depression. This  medicine may be used for other purposes; ask your health care provider or pharmacist if you have questions. COMMON BRAND NAME(S): Wellbutrin What should I tell my health care provider before I take this medicine? They need to know if you have any of these conditions:  an eating disorder, such as anorexia or bulimia  bipolar disorder or psychosis  diabetes or high blood sugar, treated with medication  glaucoma  heart disease, previous heart attack, or irregular heart beat  head injury or brain tumor  high blood pressure  kidney or liver disease  seizures  suicidal thoughts or a previous suicide attempt  Tourette's syndrome  weight loss  an unusual or allergic reaction to bupropion, other medicines, foods, dyes, or preservatives  breast-feeding  pregnant or trying to become pregnant How should I use this medicine? Take this medicine by mouth with a glass of water. Follow the directions on the prescription label. You can take it with or without food. If it upsets your stomach, take it with food. Take your medicine at regular intervals. Do not take your medicine more often than directed. Do not stop taking this medicine suddenly except upon the advice of your doctor. Stopping this medicine too quickly may cause serious side effects or your condition may worsen. A special MedGuide will be given to you by the pharmacist with each prescription and refill. Be sure to read this information carefully each time. Talk to your pediatrician regarding the use of this medicine in children. Special care may be needed. Overdosage: If you think you have taken too much of this medicine contact a poison control center or emergency room at once. NOTE: This medicine is only for you. Do not share this medicine with others. What if I miss a dose? If you miss a dose, take it as soon as you can. If it is less than four hours to your next dose, take only that dose and skip the missed dose. Do not take  double or extra doses. What may interact with this medicine? Do not take this medicine with any of the following medications:  linezolid  MAOIs like Azilect, Carbex, Eldepryl, Marplan, Nardil, and Parnate  methylene blue (injected into a vein)  other medicines that contain bupropion like Zyban This medicine may also interact with the following medications:  alcohol  certain medicines for anxiety or sleep  certain medicines for blood pressure like metoprolol, propranolol  certain medicines for depression or psychotic disturbances  certain medicines for HIV or AIDS like efavirenz, lopinavir, nelfinavir, ritonavir  certain medicines for irregular heart beat like propafenone, flecainide  certain medicines for Parkinson's disease like amantadine, levodopa  certain medicines for seizures like carbamazepine, phenytoin, phenobarbital  cimetidine  clopidogrel  cyclophosphamide  digoxin  furazolidone  isoniazid  nicotine  orphenadrine  procarbazine  steroid medicines like prednisone or cortisone  stimulant medicines for attention disorders, weight loss, or to stay awake  tamoxifen  theophylline  thiotepa  ticlopidine  tramadol  warfarin This  list may not describe all possible interactions. Give your health care provider a list of all the medicines, herbs, non-prescription drugs, or dietary supplements you use. Also tell them if you smoke, drink alcohol, or use illegal drugs. Some items may interact with your medicine. What should I watch for while using this medicine? Tell your doctor if your symptoms do not get better or if they get worse. Visit your doctor or healthcare provider for regular checks on your progress. Because it may take several weeks to see the full effects of this medicine, it is important to continue your treatment as prescribed by your doctor. This medicine may cause serious skin reactions. They can happen weeks to months after starting the  medicine. Contact your healthcare provider right away if you notice fevers or flu-like symptoms with a rash. The rash may be red or purple and then turn into blisters or peeling of the skin. Or, you might notice a red rash with swelling of the face, lips or lymph nodes in your neck or under your arms. Patients and their families should watch out for new or worsening thoughts of suicide or depression. Also watch out for sudden changes in feelings such as feeling anxious, agitated, panicky, irritable, hostile, aggressive, impulsive, severely restless, overly excited and hyperactive, or not being able to sleep. If this happens, especially at the beginning of treatment or after a change in dose, call your healthcare provider. Avoid alcoholic drinks while taking this medicine. Drinking excessive alcoholic beverages, using sleeping or anxiety medicines, or quickly stopping the use of these agents while taking this medicine may increase your risk for a seizure. Do not drive or use heavy machinery until you know how this medicine affects you. This medicine can impair your ability to perform these tasks. Do not take this medicine close to bedtime. It may prevent you from sleeping. Your mouth may get dry. Chewing sugarless gum or sucking hard candy, and drinking plenty of water may help. Contact your doctor if the problem does not go away or is severe. What side effects may I notice from receiving this medicine? Side effects that you should report to your doctor or health care professional as soon as possible:  allergic reactions like skin rash, itching or hives, swelling of the face, lips, or tongue  breathing problems  changes in vision  confusion  elevated mood, decreased need for sleep, racing thoughts, impulsive behavior  fast or irregular heartbeat  hallucinations, loss of contact with reality  increased blood pressure  rash, fever, and swollen lymph nodes  redness, blistering, peeling, or  loosening of the skin, including inside the mouth  seizures  suicidal thoughts or other mood changes  unusually weak or tired  vomiting Side effects that usually do not require medical attention (report to your doctor or health care professional if they continue or are bothersome):  constipation  headache  loss of appetite  nausea  tremors  weight loss This list may not describe all possible side effects. Call your doctor for medical advice about side effects. You may report side effects to FDA at 1-800-FDA-1088. Where should I keep my medicine? Keep out of the reach of children. Store at room temperature between 20 and 25 degrees C (68 and 77 degrees F), away from direct sunlight and moisture. Keep tightly closed. Throw away any unused medicine after the expiration date. NOTE: This sheet is a summary. It may not cover all possible information. If you have questions about this medicine, talk  to your doctor, pharmacist, or health care provider.  2020 Elsevier/Gold Standard (2018-12-16 14:02:47)

## 2020-08-10 DIAGNOSIS — I10 Essential (primary) hypertension: Secondary | ICD-10-CM | POA: Insufficient documentation

## 2020-08-10 DIAGNOSIS — F4321 Adjustment disorder with depressed mood: Secondary | ICD-10-CM | POA: Insufficient documentation

## 2020-08-10 DIAGNOSIS — F4320 Adjustment disorder, unspecified: Secondary | ICD-10-CM | POA: Insufficient documentation

## 2020-08-10 NOTE — Assessment & Plan Note (Signed)
Patient experiencing acute grief over marital discord. Reaction is appropriate given circumstances. She is already seeking counseling.  Discussed possible medications for assistance and gave medication information handouts for trazadone and wellbutrin. Patient will review and consider contacting me if she would like to start a medication.

## 2020-08-10 NOTE — Assessment & Plan Note (Signed)
Adherent with therapy and well controlled chronically. Had single increased measurement at dental office in context of severe grief from marital discord.  All home monitoring records and BP at time of visit are WNL. Patient is negative for red flag symptoms of end organ damage. BP range at dentist was not in emergent level.  Physical exam normal today. Would not recommend changes in therapy. Elevated reading at dentist seems to be isolated event which could have been situational vs equipment variability. - continue current regimen and regular follow up with PCP

## 2020-08-16 ENCOUNTER — Other Ambulatory Visit: Payer: Self-pay

## 2020-08-16 ENCOUNTER — Ambulatory Visit (INDEPENDENT_AMBULATORY_CARE_PROVIDER_SITE_OTHER): Payer: BC Managed Care – PPO | Admitting: *Deleted

## 2020-08-16 DIAGNOSIS — Z23 Encounter for immunization: Secondary | ICD-10-CM | POA: Diagnosis not present

## 2020-08-19 ENCOUNTER — Encounter: Payer: Self-pay | Admitting: Student in an Organized Health Care Education/Training Program

## 2020-08-20 ENCOUNTER — Other Ambulatory Visit: Payer: Self-pay | Admitting: Student in an Organized Health Care Education/Training Program

## 2020-08-20 MED ORDER — BUPROPION HCL ER (SR) 100 MG PO TB12
100.0000 mg | ORAL_TABLET | Freq: Two times a day (BID) | ORAL | 0 refills | Status: DC
Start: 2020-08-20 — End: 2020-09-12

## 2020-08-20 NOTE — Progress Notes (Signed)
Patient messaged with request for wellbutrin which was discussed at our last appointment.  I have filled 1 month and requested she follow up with me or PCP in about 2 weeks.

## 2020-08-21 ENCOUNTER — Ambulatory Visit (INDEPENDENT_AMBULATORY_CARE_PROVIDER_SITE_OTHER): Payer: BC Managed Care – PPO

## 2020-08-21 ENCOUNTER — Ambulatory Visit
Admission: EM | Admit: 2020-08-21 | Discharge: 2020-08-21 | Disposition: A | Discharge status: Home / Self Care | Payer: BC Managed Care – PPO | Attending: Family Medicine | Admitting: Family Medicine

## 2020-08-21 DIAGNOSIS — R0789 Other chest pain: Secondary | ICD-10-CM | POA: Diagnosis not present

## 2020-08-21 DIAGNOSIS — W19XXXA Unspecified fall, initial encounter: Secondary | ICD-10-CM | POA: Diagnosis not present

## 2020-08-21 MED ORDER — TRAMADOL HCL 50 MG PO TABS: 50.0000 mg | ORAL_TABLET | Freq: Four times a day (QID) | ORAL | 0 refills | Status: DC | PRN

## 2020-08-21 MED ORDER — DICLOFENAC SODIUM 75 MG PO TBEC
75.0000 mg | DELAYED_RELEASE_TABLET | Freq: Two times a day (BID) | ORAL | 0 refills | Status: DC
Start: 2020-08-21 — End: 2020-12-13

## 2020-08-21 NOTE — ED Provider Notes (Signed)
[facility]    CSN: [id number] Arrival date & time: 08/21/20  1843      History   Chief Complaint Chief Complaint  Patient presents with  . Fall    HPI Carla Berry is a 49 y.o. female.   Established [facility] patient  Pt states she fell on her rt side on concrete last Wednesday and had rib pain. States today sitting in a meeting had a sharp pain to rt ribs. States pain to move and take a deep breath. No distress noted. Speaking in complete sentences.  Pain is pleuritic, sharp, and provoked with deep breaths or sudden movement.     Past Medical History:  Diagnosis Date  . Chronic cough 10/12/2015  . Diastolic hypertension 2/99/2426  . FIBROIDS, UTERUS 02/08/2007   Qualifier: Diagnosis of  By: McDiarmid MD, Sherren Mocha    . Ganglion of right wrist 04/04/2016  . Gastric polyp 07/23/12   Pathology: Chronic inactive gastritis.  EGD by Dr Verdia Kuba (GI)  . Gastritis, acute 07/16/2012   Dr Collene Mares (GI) ordered abdominal US that was normal (07/13/12).  Dr Collene Mares ordered Nuc Med Hepatobiliary Tc99 scan with cholecystikinin stimulation which was normal on 07/13/12.  Upper Endoscopy by Dr Collene Mares on 07/21/12 showed mild antral gastritis and sessile gastric polyps that were benign on histopathology    . GASTROESOPHAGEAL REFLUX, NO ESOPHAGITIS 12/03/2006   Qualifier: Diagnosis of  By: Drucie Ip    . Glucose intolerance 12/03/2006   Qualifier: Diagnosis of  By: McDiarmid MD, Sherren Mocha    . Hair loss 04/04/2016  . History of nephrolithiasis 12/03/2006   Qualifier: History of  By: McDiarmid MD, Sherren Mocha    . MIGRAINE, UNSPEC., W/O INTRACTABLE MIGRAINE 12/03/2006   Qualifier: Diagnosis of  By: Drucie Ip    . Myalgia, Chronic   . PALPITATIONS, RECURRENT 07/31/2008   Qualifier: History of  By: McDiarmid MD, Sherren Mocha    . PAPANICOLAOU SMEAR, ABNORMAL 12/03/2006   Qualifier: History of By: McDiarmid MD, Sherren Mocha    . Skin lesion of chest wall 11/17/2014  . Suprapubic pain 11/30/2014  . TOBACCO USE, QUIT  12/11/2009   Qualifier: Diagnosis of  By: Shelbie Proctor    . Urinary incontinence, mixed   . 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 Hypertension 08/10/2020  . Grief reaction 08/10/2020  . Neck pain 07/15/2019  . Chest tightness 07/15/2019  . Hx of hypokalemia 12/16/2018  . Screen for STD (sexually transmitted disease) 12/16/2018  . Dysuria 12/16/2018  . Iron deficiency 06/15/2018  . Heavy menstrual bleeding 05/21/2018  . Hypokalemia 05/21/2018  . Abdominal pain 11/26/2017  . Snoring 04/03/2017  . Myalgia, Chronic   . Urinary incontinence, mixed   . Diastolic hypertension 83/41/9622  . Functional dyspepsia 10/12/2015  . Personal history of vulvar dysplasia 11/30/2014  . Metabolic syndrome 29/79/8921  . FIBROIDS, UTERUS 02/08/2007  . Severe obesity (BMI >= 40) ([place]) 12/03/2006  . RHINITIS, ALLERGIC 12/03/2006  . Glucose intolerance 12/03/2006  . GASTROESOPHAGEAL REFLUX, NO ESOPHAGITIS 12/03/2006    Past Surgical History:  Procedure Laterality Date  . CESAREAN SECTION     x 2  . COLONOSCOPY N/A 02/08/2013   Procedure: COLONOSCOPY;  Surgeon: Beryle Beams, MD;  Location: [place];  Service: Endoscopy;  Laterality: N/A;  . TONSILLECTOMY    .  TUBAL LIGATION    . 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      Home Medications    Prior to Admission medications   Medication Sig Start Date End Date Taking? Authorizing Provider  potassium chloride (KLOR-CON) 10 MEQ tablet Take 10 mEq by mouth daily.   Yes [provider]  amLODipine (NORVASC) 10 MG tablet TAKE 1 TABLET (10 MG TOTAL) BY MOUTH DAILY. 07/13/20   Zenia Resides, MD  buPROPion (WELLBUTRIN SR) 100 MG 12 hr tablet Take 1 tablet (100 mg total) by mouth 2 (two) times daily.  08/20/20   Anderson, Chelsey L, DO  cetirizine (ZYRTEC) 10 MG tablet TAKE 1 TABLET BY MOUTH EVERY DAY 02/23/18   McDiarmid, Blane Ohara, MD  cyclobenzaprine (FLEXERIL) 5 MG tablet TAKE 1 TABLET BY MOUTH THREE TIMES A DAY AS NEEDED FOR MUSCLE SPASMS 12/13/19   McDiarmid, Blane Ohara, MD  diclofenac (VOLTAREN) 75 MG EC tablet Take 1 tablet (75 mg total) by mouth 2 (two) times daily. 08/21/20   Robyn Haber, MD  esomeprazole (NEXIUM) 40 MG capsule TAKE 1 CAPSULE BY MOUTH EVERY DAY 07/13/20   Zenia Resides, MD  hydrochlorothiazide (HYDRODIURIL) 25 MG tablet TAKE 1/2 TABLET BY MOUTH EVERY DAY 10/10/19   McDiarmid, Blane Ohara, MD  traMADol (ULTRAM) 50 MG tablet Take 1 tablet (50 mg total) by mouth every 6 (six) hours as needed. 08/21/20   Robyn Haber, MD    Family History Family History  Problem Relation Age of Onset  . Hypertension Mother   . Diabetes Mother   . Nephrolithiasis Mother   . Stroke Mother   . Early death Father        ?Encephalitis  . Factor V Leiden deficiency Maternal Grandmother   . Stroke Maternal Grandfather     Social History Social History   Tobacco Use  . Smoking status: Former Smoker    Packs/day: 1.00    Years: 15.00    Pack years: 15.00    Types: Cigarettes    Start date: 10/07/1987    Quit date: 07/07/2003    Years since quitting: 17.1  . Smokeless tobacco: Never Used  Substance Use Topics  . Alcohol use: No    Alcohol/week: 0.0 standard drinks  . Drug use: No     Allergies   Codeine, Prednisone, and Pregabalin   Review of Systems Review of Systems  Cardiovascular: Positive for chest pain.     Physical Exam Triage Vital Signs ED Triage Vitals  Enc Vitals Group     BP 08/21/20 1929 (!) 131/93     Pulse Rate 08/21/20 1929 83     Resp 08/21/20 1929 20     Temp 08/21/20 1929 97.8 F (36.6 C)     Temp Source 08/21/20 1929 Oral     SpO2 08/21/20 1929 94 %     Weight --      Height --      Head Circumference --      Peak Flow --      Pain Score  08/21/20 1935 6     Pain Loc --      Pain Edu? --      Excl. in [place]? --    No data found.  Updated Vital Signs BP (!) 131/93 (BP Location: Left Arm)   Pulse 83   Temp 97.8 F (36.6 C) (Oral)   Resp 20   LMP  08/14/2020   SpO2 94%    Physical Exam Vitals and nursing note reviewed.  Constitutional:      Appearance: Normal appearance. She is obese.  HENT:     Mouth/Throat:     Pharynx: Oropharynx is clear.  Eyes:     Conjunctiva/sclera: Conjunctivae normal.  Cardiovascular:     Rate and Rhythm: Normal rate and regular rhythm.     Heart sounds: Normal heart sounds.  Pulmonary:     Effort: Pulmonary effort is normal.     Breath sounds: Normal breath sounds.  Musculoskeletal:        General: Normal range of motion.     Cervical back: Normal range of motion and neck supple.  Skin:    General: Skin is warm and dry.  Neurological:     General: No focal deficit present.     Mental Status: She is alert and oriented to person, place, and time.  Psychiatric:        Mood and Affect: Mood normal.        Behavior: Behavior normal.        Thought Content: Thought content normal.        Judgment: Judgment normal.      UC Treatments / Results  Labs (all labs ordered are listed, but only abnormal results are displayed) Labs Reviewed - No data to display  EKG   Radiology No results found.  Procedures Procedures (including critical care time)  Medications Ordered in UC Medications - No data to display  Initial Impression / Assessment and Plan / UC Course  I have reviewed the triage vital signs and the nursing notes.  Pertinent labs & imaging results that were available during my care of the patient were reviewed by me and considered in my medical decision making (see chart for details).    Final Clinical Impressions(s) / UC Diagnoses   Final diagnoses:  Chest wall pain   Discharge Instructions   None    ED Prescriptions    Medication Sig Dispense Auth.  Provider   diclofenac (VOLTAREN) 75 MG EC tablet Take 1 tablet (75 mg total) by mouth 2 (two) times daily. 10 tablet Robyn Haber, MD   traMADol (ULTRAM) 50 MG tablet Take 1 tablet (50 mg total) by mouth every 6 (six) hours as needed. 15 tablet Robyn Haber, MD     I have reviewed the PDMP during this encounter.   Robyn Haber, MD 08/21/20 2030

## 2020-08-21 NOTE — ED Triage Notes (Signed)
Pt states she fell on her rt side on concrete last Wednesday and had rib pain. States today sitting in a meeting had a sharp pain to rt ribs. States pain to move and take a deep breath. No distress noted. Speaking in complete sentences.

## 2020-09-03 ENCOUNTER — Other Ambulatory Visit: Payer: Self-pay

## 2020-09-03 ENCOUNTER — Ambulatory Visit (INDEPENDENT_AMBULATORY_CARE_PROVIDER_SITE_OTHER): Payer: BC Managed Care – PPO | Admitting: Student in an Organized Health Care Education/Training Program

## 2020-09-03 ENCOUNTER — Encounter: Payer: Self-pay | Admitting: Student in an Organized Health Care Education/Training Program

## 2020-09-03 DIAGNOSIS — F4321 Adjustment disorder with depressed mood: Secondary | ICD-10-CM | POA: Diagnosis not present

## 2020-09-03 DIAGNOSIS — I10 Essential (primary) hypertension: Secondary | ICD-10-CM | POA: Diagnosis not present

## 2020-09-03 DIAGNOSIS — R0789 Other chest pain: Secondary | ICD-10-CM

## 2020-09-03 NOTE — Patient Instructions (Addendum)
It was a pleasure to see you today!  To summarize our discussion for this visit:  Please do not take tramadol any more. You can try a binder at home like we discussed and use tylenol as needed for pain. Continue to stay active and take deep breaths.  Since you are going through a lot, keep at your therapy and start the wellbutrin which we talked about at your last visit. I'd like to see you after you have been on the medication for 2 weeks. You can follow up with me or Dr. Wendy Poet.  You can also use exercise to help with your depression. Start slow and increase as you feel you can.  I hope you see some silver lining in this storm you're going through. Stay strong.   Some additional health maintenance measures we should update are: Health Maintenance Due  Topic Date Due  . Hepatitis C Screening  Never done  . PAP SMEAR-Modifier  04/16/2020  .    Call the clinic at [phone] if your symptoms worsen or you have any concerns.   Thank you for allowing me to take part in your care,  Dr. Doristine Mango   Exercising to Stay Healthy To become healthy and stay healthy, it is recommended that you do moderate-intensity and vigorous-intensity exercise. You can tell that you are exercising at a moderate intensity if your heart starts beating faster and you start breathing faster but can still hold a conversation. You can tell that you are exercising at a vigorous intensity if you are breathing much harder and faster and cannot hold a conversation while exercising. Exercising regularly is important. It has many health benefits, such as:  Improving overall fitness, flexibility, and endurance.  Increasing bone density.  Helping with weight control.  Decreasing body fat.  Increasing muscle strength.  Reducing stress and tension.  Improving overall health. How often should I exercise? Choose an activity that you enjoy, and set realistic goals. Your health care provider can help you  make an activity plan that works for you. Exercise regularly as told by your health care provider. This may include:  Doing strength training two times a week, such as: ? Lifting weights. ? Using resistance bands. ? Push-ups. ? Sit-ups. ? Yoga.  Doing a certain intensity of exercise for a given amount of time. Choose from these options: ? A total of 150 minutes of moderate-intensity exercise every week. ? A total of 75 minutes of vigorous-intensity exercise every week. ? A mix of moderate-intensity and vigorous-intensity exercise every week. Children, pregnant women, people who have not exercised regularly, people who are overweight, and older adults may need to talk with a health care provider about what activities are safe to do. If you have a medical condition, be sure to talk with your health care provider before you start a new exercise program. What are some exercise ideas? Moderate-intensity exercise ideas include:  Walking 1 mile (1.6 km) in about 15 minutes.  Biking.  Hiking.  Golfing.  Dancing.  Water aerobics. Vigorous-intensity exercise ideas include:  Walking 4.5 miles (7.2 km) or more in about 1 hour.  Jogging or running 5 miles (8 km) in about 1 hour.  Biking 10 miles (16.1 km) or more in about 1 hour.  Lap swimming.  Roller-skating or in-line skating.  Cross-country skiing.  Vigorous competitive sports, such as football, basketball, and soccer.  Jumping rope.  Aerobic dancing. What are some everyday activities that can help me to get exercise?  [place] work, such as: ? Pushing a Conservation officer, nature. ? Raking and bagging leaves.  Washing your car.  Pushing a stroller.  Shoveling snow.  Gardening.  Washing windows or floors. How can I be more active in my day-to-day activities?  Use stairs instead of an elevator.  Take a walk during your lunch break.  If you drive, park your car farther away from your work or school.  If you take public  transportation, get off one stop early and walk the rest of the way.  Stand up or walk around during all of your indoor phone calls.  Get up, stretch, and walk around every 30 minutes throughout the day.  Enjoy exercise with a friend. Support to continue exercising will help you keep a regular routine of activity. What guidelines can I follow while exercising?  Before you start a new exercise program, talk with your health care provider.  Do not exercise so much that you hurt yourself, feel dizzy, or get very short of breath.  Wear comfortable clothes and wear shoes with good support.  Drink plenty of water while you exercise to prevent dehydration or heat stroke.  Work out until your breathing and your heartbeat get faster. Where to find more information  U.S. Department of Health and Human Services: Bonded[employer].at  Centers for Disease Control and Prevention (CDC): http://www.wolf.info/ Summary  Exercising regularly is important. It will improve your overall fitness, flexibility, and endurance.  Regular exercise also will improve your overall health. It can help you control your weight, reduce stress, and improve your bone density.  Do not exercise so much that you hurt yourself, feel dizzy, or get very short of breath.  Before you start a new exercise program, talk with your health care provider. This information is not intended to replace advice given to you by your health care provider. Make sure you discuss any questions you have with your health care provider. Document Revised: 09/04/2017 Document Reviewed: 08/13/2017 Elsevier Patient Education  2020 [employer].

## 2020-09-03 NOTE — Progress Notes (Signed)
   SUBJECTIVE:   CHIEF COMPLAINT / HPI: f/u acute grief, fall f/u   Acute grief- associated with marital discourse. Since we last spoke, patient has not taken the Wellbutrin. She had a fall and given pain medication so was afraid to mix the medications together. She has a counseling appointment tomorrow. Can only afford co-pay for once a month appointment. Had worsening of her depression over the holidays when she was forced to spend time with her husband and his family. She continues to deny SI and her daughter is her motivation to get better.  Fall on the 10th which resulted in right sided rib pain. Seen at [facility] on the 16th. No rib fractures seen on xray.  Prescribed tramadol and used for three days. has h/o seizures.  She is feeling much better now but still has some pain with deep breaths. Does not impede breathing or ambulation. Has some difficulty with lifting heavy objects at work.  OBJECTIVE:   BP 126/75   Pulse (!) 102   Ht 5\' 3"  (1.6 m)   Wt 240 lb 9.6 oz (109.1 kg)   LMP 08/14/2020   SpO2 97%   BMI 42.62 kg/m   General: NAD, pleasant, able to participate in exam Respiratory: CTAB, normal effort, No wheezes, rales or rhonchi Chest wall: mild tenderness to palpation on right side, approximately T7-10. Extremities: no edema. WWP. Skin: warm and dry, no rashes noted Neuro: alert and oriented, no focal deficits Psych: Normal affect and mood. Tearful when discussing her marital discord.   ASSESSMENT/PLAN:   Hypertension Well controlled this visit.  Grief reaction Patient has not started the prescribed medication or counseling.  She endorses that her grief was worsened over the holiday but still denies SI. She plans to start the medication today and has a counseling appointment this upcoming week. We discussed following up 2 weeks after starting the medication.  Chest wall pain Xray at [facility] neg for fx Pain significantly improving. Recommended patient not take any more  tramadol with h/o seizure but she has not taken for over a week now any way - tylenol PRN  - described how to use binder PRN - continue deep breaths to prevent atelectasis      Richarda Osmond, [place]

## 2020-09-04 DIAGNOSIS — R0789 Other chest pain: Secondary | ICD-10-CM | POA: Insufficient documentation

## 2020-09-04 HISTORY — DX: Other chest pain: R07.89

## 2020-09-04 NOTE — Assessment & Plan Note (Signed)
Xray at [facility] neg for fx Pain significantly improving. Recommended patient not take any more tramadol with h/o seizure but she has not taken for over a week now any way - tylenol PRN  - described how to use binder PRN - continue deep breaths to prevent atelectasis

## 2020-09-04 NOTE — Assessment & Plan Note (Signed)
Patient has not started the prescribed medication or counseling.  She endorses that her grief was worsened over the holiday but still denies SI. She plans to start the medication today and has a counseling appointment this upcoming week. We discussed following up 2 weeks after starting the medication.

## 2020-09-04 NOTE — Assessment & Plan Note (Signed)
Well controlled this visit.

## 2020-09-11 ENCOUNTER — Other Ambulatory Visit: Payer: Self-pay | Admitting: Student in an Organized Health Care Education/Training Program

## 2020-09-17 ENCOUNTER — Encounter: Payer: Self-pay | Admitting: Student in an Organized Health Care Education/Training Program

## 2020-09-17 ENCOUNTER — Other Ambulatory Visit: Payer: Self-pay

## 2020-09-17 ENCOUNTER — Ambulatory Visit (INDEPENDENT_AMBULATORY_CARE_PROVIDER_SITE_OTHER): Payer: BC Managed Care – PPO | Admitting: Student in an Organized Health Care Education/Training Program

## 2020-09-17 VITALS — BP 125/80 | HR 88 | Ht 63.0 in | Wt 241.6 lb

## 2020-09-17 DIAGNOSIS — F3289 Other specified depressive episodes: Secondary | ICD-10-CM

## 2020-09-17 DIAGNOSIS — I10 Essential (primary) hypertension: Secondary | ICD-10-CM

## 2020-09-17 DIAGNOSIS — Z1159 Encounter for screening for other viral diseases: Secondary | ICD-10-CM | POA: Diagnosis not present

## 2020-09-17 DIAGNOSIS — F339 Major depressive disorder, recurrent, unspecified: Secondary | ICD-10-CM

## 2020-09-17 DIAGNOSIS — E876 Hypokalemia: Secondary | ICD-10-CM

## 2020-09-17 MED ORDER — METAMUCIL 0.52 G PO CAPS: 0.5200 g | ORAL_CAPSULE | Freq: Every day | ORAL | 0 refills | Status: DC

## 2020-09-17 NOTE — Progress Notes (Signed)
   SUBJECTIVE:   CHIEF COMPLAINT / HPI: depression f/u, medication management  Depression- taking medication at nighttime. Feels a great improvement. - saw a counselor and got some good tools for managing symptoms.  Weight loss- patient taking shakes, a fiber supplement, probiotic, and caffeine combination pill from weight loss program.   OBJECTIVE:   BP 125/80   Pulse 88   Ht 5\' 3"  (1.6 m)   Wt 241 lb 9.6 oz (109.6 kg)   SpO2 98%   BMI 42.80 kg/m   General: NAD, pleasant, able to participate in exam Cardiac: RRR, normal heart sounds, no murmurs. 2+ radial and PT pulses bilaterally Respiratory: CTAB, normal effort, No wheezes, rales or rhonchi Abdomen: soft, nontender, nondistended, no hepatic or splenomegaly, +BS Extremities: no edema. WWP. Skin: warm and dry, no rashes noted Neuro: alert and oriented, no focal deficits Psych: Normal affect and mood  ASSESSMENT/PLAN:   Depression Patient has started wellbutrin and counseling and endorses great improvement in symptoms.  Also has decreased stressors from family situation and starting a new job in January. - f/u in 1 month  Hypokalemia K+ 3.2 Increase from 10 to 52mEq daily and recheck BMP in 2 weeks  Severe obesity (BMI >= 40) (HCC) Reviewed ingredients in supplements patient is taking and they seem to be healthy and low likelihood to interact with other medications.  However, they are basic fiber, caffeine, and probiotics and can be prescribed or purchased OTC much cheaper than what the program is charging so recommended patient purchase alternative products in the future.  A1c today is elevated to 6.0 Patient will follow up in a few weeks and we can discuss starting metformin for pre-diabetes/weight loss benefit     Richarda Osmond, [place]

## 2020-09-17 NOTE — Patient Instructions (Signed)
It was a pleasure to see you today!  To summarize our discussion for this visit:  I'm glad that you are doing better. You can continue taking the medication at current dose and we can follow up in about 2 months.   Use your tools to improve self worth, holidays can be especially tough  I hope you have a great experience with your new job  We are getting some blood work today to then discuss some weight loss options  For the supplements you've been taking, I would recommend using similar OTC options including a probiotic, fiber supplement which I prescribed  Some additional health maintenance measures we should update are: Health Maintenance Due  Topic Date Due  . Hepatitis C Screening  Never done  . PAP SMEAR-Modifier  04/16/2020  .    Please return to our clinic to see [place] in about 2 months.  Call the clinic at [phone] if your symptoms worsen or you have any concerns.   Thank you for allowing me to take part in your care,  Dr. Doristine Mango  Lactobacillus Oral formulations What is this medicine? LACTOBACILLUS (lak toh buh SIL uhs) is a supplement. It is used to help the normal balance of bacteria in the colon. This may treat or prevent diarrhea caused by an infection or by antibiotics. The FDA has not approved this supplement for any medical use. This supplement may be used for other purposes; ask your health care provider or pharmacist if you have questions. This medicine may be used for other purposes; ask your health care provider or pharmacist if you have questions. COMMON BRAND NAME(S): BioGaia, Culturelle, Florajen, Floranex, Probiotic Acidophilus What should I tell my health care provider before I take this medicine? They need to know if you have any of these conditions:  chronic disease  immune system problems  prosthetic heart valve or valvular heart disease  an unusual or allergic reaction to Lactobacillus, any medicines, lactose or milk, other foods,  dyes, or preservatives  pregnant or trying to get pregnant  breast-feeding How should I use this medicine? Take this medicine by mouth with a small amount of milk, fruit juice, or water. Follow the directions on the package labeling, or take as directed by your health care professional. This medicine can be taken with cereal or other food. Do not take this medicine more often than directed. Contact your pediatrician regarding the use of this medicine in children. Special care may be needed. This medicine is not recommended for children under 65 years old unless prescribed by a doctor. Overdosage: If you think you have taken too much of this medicine contact a poison control center or emergency room at once. NOTE: This medicine is only for you. Do not share this medicine with others. What if I miss a dose? If you miss a dose, take it as soon as you can. If it is almost time for your next dose, take only that dose. Do not take double or extra doses. What may interact with this medicine? Interactions are not expected. This list may not describe all possible interactions. Give your health care provider a list of all the medicines, herbs, non-prescription drugs, or dietary supplements you use. Also tell them if you smoke, drink alcohol, or use illegal drugs. Some items may interact with your medicine. What should I watch for while using this medicine? See your doctor if your symptoms do not get better or if they get worse. Do not take this supplement  for more than 2 days or if you have a fever unless your doctor tells you to. If you have allergies to milk or you are sensitive to lactose, do not use this supplement. What side effects may I notice from receiving this medicine? Side effects that you should report to your doctor or health care professional as soon as possible:  allergic reactions like skin rash, itching or hives, swelling of the face, lips, or tongue  breathing problems  severe  nausea, vomiting  unusually weak or tired Side effects that usually do not require medical attention (report to your doctor or health care professional if they continue or are bothersome):  hiccups  stomach gas This list may not describe all possible side effects. Call your doctor for medical advice about side effects. You may report side effects to FDA at 1-800-FDA-1088. Where should I keep my medicine? Keep out of the reach of children. Store in the refrigerator or as directed on the package label. Do not freeze. Throw away any unused medicine after the expiration date. NOTE: This sheet is a summary. It may not cover all possible information. If you have questions about this medicine, talk to your doctor, pharmacist, or health care provider.  2020 Elsevier/Gold Standard (2011-09-19 07:41:13)

## 2020-09-18 LAB — CBC
Hematocrit: 43 % (ref 34.0–46.6)
Hemoglobin: 13.9 g/dL (ref 11.1–15.9)
MCH: 26.2 pg — ABNORMAL LOW (ref 26.6–33.0)
MCHC: 32.3 g/dL (ref 31.5–35.7)
MCV: 81 fL (ref 79–97)
Platelets: 388 10*3/uL (ref 150–450)
RBC: 5.31 x10E6/uL — ABNORMAL HIGH (ref 3.77–5.28)
RDW: 15.3 % (ref 11.7–15.4)
WBC: 10.1 10*3/uL (ref 3.4–10.8)

## 2020-09-18 LAB — BASIC METABOLIC PANEL
BUN/Creatinine Ratio: 12 (ref 9–23)
BUN: 11 mg/dL (ref 6–24)
CO2: 25 mmol/L (ref 20–29)
Calcium: 9.3 mg/dL (ref 8.7–10.2)
Chloride: 99 mmol/L (ref 96–106)
Creatinine, Ser: 0.94 mg/dL (ref 0.57–1.00)
GFR calc Af Amer: 84 mL/min/{1.73_m2} (ref >59)
GFR calc non Af Amer: 72 mL/min/{1.73_m2} (ref >59)
Glucose: 132 mg/dL — ABNORMAL HIGH (ref 65–99)
Potassium: 3.2 mmol/L — ABNORMAL LOW (ref 3.5–5.2)
Sodium: 139 mmol/L (ref 134–144)

## 2020-09-18 LAB — TSH: TSH: 0.923 u[IU]/mL (ref 0.450–4.500)

## 2020-09-18 LAB — HEMOGLOBIN A1C
Est. average glucose Bld gHb Est-mCnc: 126 mg/dL
Hgb A1c MFr Bld: 6 % — ABNORMAL HIGH (ref 4.8–5.6)

## 2020-09-18 LAB — LIPID PANEL
Chol/HDL Ratio: 2.5 ratio (ref 0.0–4.4)
Cholesterol, Total: 137 mg/dL (ref 100–199)
HDL: 54 mg/dL (ref >39)
LDL Chol Calc (NIH): 66 mg/dL (ref 0–99)
Triglycerides: 87 mg/dL (ref 0–149)
VLDL Cholesterol Cal: 17 mg/dL (ref 5–40)

## 2020-09-18 LAB — HEPATITIS C ANTIBODY: Hep C Virus Ab: <0.1 s/co ratio (ref 0.0–0.9)

## 2020-09-19 DIAGNOSIS — F32A Depression, unspecified: Secondary | ICD-10-CM | POA: Insufficient documentation

## 2020-09-19 HISTORY — DX: Depression, unspecified: F32.A

## 2020-09-19 NOTE — Assessment & Plan Note (Signed)
K+ 3.2 Increase from 10 to 72mEq daily and recheck BMP in 2 weeks

## 2020-09-19 NOTE — Assessment & Plan Note (Signed)
Patient has started wellbutrin and counseling and endorses great improvement in symptoms.  Also has decreased stressors from family situation and starting a new job in January. - f/u in 1 month

## 2020-09-19 NOTE — Assessment & Plan Note (Signed)
Reviewed ingredients in supplements patient is taking and they seem to be healthy and low likelihood to interact with other medications.  However, they are basic fiber, caffeine, and probiotics and can be prescribed or purchased OTC much cheaper than what the program is charging so recommended patient purchase alternative products in the future.  A1c today is elevated to 6.0 Patient will follow up in a few weeks and we can discuss starting metformin for pre-diabetes/weight loss benefit

## 2020-10-01 ENCOUNTER — Other Ambulatory Visit: Payer: BC Managed Care – PPO

## 2020-10-01 ENCOUNTER — Other Ambulatory Visit: Payer: Self-pay

## 2020-10-01 DIAGNOSIS — E876 Hypokalemia: Secondary | ICD-10-CM

## 2020-10-02 LAB — BASIC METABOLIC PANEL
BUN/Creatinine Ratio: 14 (ref 9–23)
BUN: 13 mg/dL (ref 6–24)
CO2: 24 mmol/L (ref 20–29)
Calcium: 9.6 mg/dL (ref 8.7–10.2)
Chloride: 100 mmol/L (ref 96–106)
Creatinine, Ser: 0.93 mg/dL (ref 0.57–1.00)
GFR calc Af Amer: 85 mL/min/{1.73_m2} (ref >59)
GFR calc non Af Amer: 73 mL/min/{1.73_m2} (ref >59)
Glucose: 77 mg/dL (ref 65–99)
Potassium: 4.3 mmol/L (ref 3.5–5.2)
Sodium: 140 mmol/L (ref 134–144)

## 2020-10-10 ENCOUNTER — Other Ambulatory Visit: Payer: Self-pay | Admitting: Family Medicine

## 2020-10-10 DIAGNOSIS — Z79899 Other long term (current) drug therapy: Secondary | ICD-10-CM

## 2020-10-14 ENCOUNTER — Other Ambulatory Visit: Payer: Self-pay | Admitting: Family Medicine

## 2020-10-15 NOTE — Telephone Encounter (Signed)
Ms Carla Berry needs to be seen at [facility] before further refills.

## 2020-10-17 NOTE — Telephone Encounter (Signed)
Spoke with pt. 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 Salvatore Marvel, CMA

## 2020-10-30 NOTE — Telephone Encounter (Signed)
Pt has appointment on 12/13/20 with PCP. Ravan Schlemmer Zimmerman Rumple, CMA

## 2020-12-13 ENCOUNTER — Ambulatory Visit (INDEPENDENT_AMBULATORY_CARE_PROVIDER_SITE_OTHER): Payer: BC Managed Care – PPO | Admitting: Family Medicine

## 2020-12-13 ENCOUNTER — Other Ambulatory Visit: Payer: Self-pay

## 2020-12-13 VITALS — BP 110/80 | HR 82 | Ht 63.0 in | Wt 247.4 lb

## 2020-12-13 DIAGNOSIS — E876 Hypokalemia: Secondary | ICD-10-CM

## 2020-12-13 DIAGNOSIS — I10 Essential (primary) hypertension: Secondary | ICD-10-CM | POA: Diagnosis not present

## 2020-12-13 DIAGNOSIS — F3289 Other specified depressive episodes: Secondary | ICD-10-CM

## 2020-12-14 ENCOUNTER — Encounter: Payer: Self-pay | Admitting: Family Medicine

## 2020-12-14 LAB — BASIC METABOLIC PANEL
BUN/Creatinine Ratio: 17 (ref 9–23)
BUN: 15 mg/dL (ref 6–24)
CO2: 24 mmol/L (ref 20–29)
Calcium: 9.2 mg/dL (ref 8.7–10.2)
Chloride: 101 mmol/L (ref 96–106)
Creatinine, Ser: 0.9 mg/dL (ref 0.57–1.00)
Glucose: 103 mg/dL — ABNORMAL HIGH (ref 65–99)
Potassium: 3.4 mmol/L — ABNORMAL LOW (ref 3.5–5.2)
Sodium: 141 mmol/L (ref 134–144)
eGFR: 79 mL/min/{1.73_m2} (ref >59)

## 2020-12-17 ENCOUNTER — Encounter: Payer: Self-pay | Admitting: Family Medicine

## 2020-12-17 DIAGNOSIS — E876 Hypokalemia: Secondary | ICD-10-CM

## 2020-12-17 DIAGNOSIS — I10 Essential (primary) hypertension: Secondary | ICD-10-CM

## 2020-12-17 MED ORDER — TRIAMTERENE-HCTZ 37.5-25 MG PO TABS: 0.5000 | ORAL_TABLET | Freq: Every day | ORAL | 3 refills | Status: DC

## 2020-12-17 NOTE — Progress Notes (Signed)
Aryahi Denzler Espinola is accompanied by patient and daughter 50 yr old) Sources of clinical information for visit is/are patient and past medical records. Nursing assessment for this office visit was reviewed with the patient for accuracy and revision.     Previous Report(s) Reviewed: historical medical records  Depression screen [facility] 2/9 12/13/2020  Decreased Interest 1  Down, Depressed, Hopeless 1  PHQ - 2 Score 2  Altered sleeping 1  Tired, decreased energy 1  Change in appetite 1  Feeling bad or failure about yourself  1  Trouble concentrating 1  Moving slowly or fidgety/restless 0  Suicidal thoughts 0  PHQ-9 Score 7  Difficult doing work/chores -  Some recent data might be hidden    Fall Risk  07/14/2019 09/18/2017 04/16/2017 05/15/2016 04/10/2016  Falls in the past year? 0 No No No No    PHQ9 SCORE ONLY 12/13/2020 09/17/2020 08/09/2020  PHQ-9 Total Score 7 2 11     Adult vaccines due  Topic Date Due  . TETANUS/TDAP  04/29/2023    There are no preventive care reminders to display for this patient.    History/P.E. limitations: none  Adult vaccines due  Topic Date Due  . TETANUS/TDAP  04/29/2023   There are no preventive care reminders to display for this patient. There are no preventive care reminders to display for this patient.   Chief Complaint  Patient presents with  . 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   She is in process of divorce.

## 2020-12-17 NOTE — Assessment & Plan Note (Signed)
Stop HCTZ Start Maxide 37.5/25 mg tablet, half tablet by mouth once a day.  Prevent HCTZ-associated hypokalemia

## 2020-12-17 NOTE — Assessment & Plan Note (Signed)
Established problem that has improved.  Carla Berry has not taken the prescribed Wellbutrin for some time.  Will stop Wellbutrin for now.  Carla Bakula will continue with counseling.

## 2020-12-17 NOTE — Assessment & Plan Note (Addendum)
Today's Vitals   12/13/20 1552  BP: 110/80  Pulse: 82  SpO2: 96%  Weight: 247 lb 6 oz (112.2 kg)  Height: 5\' 3"  (1.6 m)  PainSc: 2   PainLoc: Leg   Basic Metabolic Panel:    Component Value Date/Time   NA 141 12/13/2020 1652   K 3.4 (L) 12/13/2020 1652   CL 101 12/13/2020 1652   CO2 24 12/13/2020 1652   BUN 15 12/13/2020 1652   CREATININE 0.90 12/13/2020 1652   CREATININE 0.80 08/12/2016 1526   GLUCOSE 103 (H) 12/13/2020 1652   GLUCOSE 95 11/26/2017 1553   CALCIUM 9.2 12/13/2020 1652    Body mass index is 43.82 kg/m. Established problem. Adequate blood pressure control.  No evidence of new end organ damage.  Tolerating medication without significant adverse effects.  Plan to continue current blood pressure medication regiment.  Will recommend swtich to Dyazide for potassium sparing.

## 2021-01-06 ENCOUNTER — Other Ambulatory Visit: Payer: Self-pay | Admitting: Family Medicine

## 2021-03-12 ENCOUNTER — Telehealth: Payer: Self-pay

## 2021-03-12 DIAGNOSIS — U071 COVID-19: Secondary | ICD-10-CM

## 2021-03-12 NOTE — Telephone Encounter (Signed)
Patient calls nurse line regarding testing positive for COVID today. Patient reports sore throat, cough, fever and chills. Tmax 101.6  Reports taking Dayquil this morning at around 7 am. Advised patient to take another dose of Dayquil or tylenol to help bring fever down. Advised not to take these together as Dayquil does include acetaminophen. Patient reports that she was instructed in the past not to take ibuprofen due to stomach issues.   Advised of supportive measures. Please advise if patient would be candidate for infusion therapy or oral antiviral treatments.   Talbot Grumbling, RN

## 2021-03-13 ENCOUNTER — Encounter: Payer: Self-pay | Admitting: Family Medicine

## 2021-03-13 MED ORDER — NIRMATRELVIR/RITONAVIR (PAXLOVID)TABLET: 3.0000 | ORAL_TABLET | Freq: Two times a day (BID) | ORAL | 0 refills | Status: DC

## 2021-03-13 NOTE — Telephone Encounter (Signed)
(+)  home CoViD test 03/12/21 Onset of symptoms 03/11/21 Symptoms Cough, fever, chills, sore throat. No shortness of breath.  Patient weight > 40 kilogram.  eGFR > 60 ml/min  Patient has mild to moderate Covid infection.  Patient's obesity places her at increase risk of severe CoViD illness.  There are no significant drug interactions with Paxlovid.  Patient informed of risks including Emergency Use authoriaztion by FDA, hepatotoxicity, dysgeusia and anosmia, diarrhea.  Patient informed of possible benefit of reducing risk of hospitalization.  Carla Berry chose to take Paxlovid.  Rx sent online to her pharmacy. Patient instructed to contct our office if she experiences worsening of her illness or adverse effects from Paxlovid. Symptomatic care discussed Self-isolation discussed.

## 2021-03-14 ENCOUNTER — Telehealth: Payer: Self-pay

## 2021-03-14 DIAGNOSIS — U071 COVID-19: Secondary | ICD-10-CM

## 2021-03-14 MED ORDER — NIRMATRELVIR/RITONAVIR (PAXLOVID)TABLET: 3.0000 | ORAL_TABLET | Freq: Two times a day (BID) | ORAL | 0 refills | Status: AC

## 2021-03-14 NOTE — Telephone Encounter (Signed)
Patient calls nurse line reporting the antiviral was not received at the pharmacy. The medication was "set to print." Will attempt to resend.

## 2021-03-14 NOTE — Telephone Encounter (Signed)
Prescription printed and faxed to [facility] pharmacy.

## 2021-04-10 ENCOUNTER — Encounter: Payer: Self-pay | Admitting: Family Medicine

## 2021-06-07 ENCOUNTER — Other Ambulatory Visit: Payer: Self-pay | Admitting: Family Medicine

## 2021-06-07 DIAGNOSIS — E876 Hypokalemia: Secondary | ICD-10-CM

## 2021-06-07 DIAGNOSIS — Z79899 Other long term (current) drug therapy: Secondary | ICD-10-CM

## 2021-06-07 NOTE — Progress Notes (Signed)
Order for Basic Metabolic Panel to follow up hypokalemia and change to Dyazide.

## 2021-06-24 ENCOUNTER — Encounter (HOSPITAL_COMMUNITY): Payer: Self-pay | Admitting: Emergency Medicine

## 2021-06-24 ENCOUNTER — Emergency Department (HOSPITAL_COMMUNITY): Payer: BC Managed Care – PPO

## 2021-06-24 ENCOUNTER — Emergency Department (HOSPITAL_COMMUNITY)
Admission: EM | Admit: 2021-06-24 | Discharge: 2021-06-24 | Disposition: A | Discharge status: Home / Self Care | Payer: BC Managed Care – PPO | Attending: Emergency Medicine | Admitting: Emergency Medicine

## 2021-06-24 DIAGNOSIS — S0990XA Unspecified injury of head, initial encounter: Secondary | ICD-10-CM | POA: Insufficient documentation

## 2021-06-24 DIAGNOSIS — M545 Low back pain, unspecified: Secondary | ICD-10-CM | POA: Diagnosis not present

## 2021-06-24 DIAGNOSIS — M542 Cervicalgia: Secondary | ICD-10-CM | POA: Diagnosis not present

## 2021-06-24 DIAGNOSIS — S299XXA Unspecified injury of thorax, initial encounter: Secondary | ICD-10-CM | POA: Diagnosis not present

## 2021-06-24 DIAGNOSIS — R52 Pain, unspecified: Secondary | ICD-10-CM

## 2021-06-24 DIAGNOSIS — Z87891 Personal history of nicotine dependence: Secondary | ICD-10-CM | POA: Insufficient documentation

## 2021-06-24 DIAGNOSIS — Z79899 Other long term (current) drug therapy: Secondary | ICD-10-CM | POA: Diagnosis not present

## 2021-06-24 DIAGNOSIS — M797 Fibromyalgia: Secondary | ICD-10-CM | POA: Diagnosis not present

## 2021-06-24 DIAGNOSIS — I10 Essential (primary) hypertension: Secondary | ICD-10-CM | POA: Insufficient documentation

## 2021-06-24 DIAGNOSIS — S298XXA Other specified injuries of thorax, initial encounter: Secondary | ICD-10-CM

## 2021-06-24 LAB — PREGNANCY, URINE: Preg Test, Ur: NEGATIVE
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
## 2021-06-24 NOTE — ED Notes (Signed)
Patient verbalizes understanding of discharge instructions. Opportunity for questioning and answers were provided. Armband removed by staff, pt discharged from ED ambulatory.   
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
## 2021-06-26 ENCOUNTER — Encounter: Payer: Self-pay | Admitting: Family Medicine

## 2021-07-06 ENCOUNTER — Other Ambulatory Visit: Payer: Self-pay | Admitting: Family Medicine

## 2021-07-06 DIAGNOSIS — K219 Gastro-esophageal reflux disease without esophagitis: Secondary | ICD-10-CM

## 2021-07-06 DIAGNOSIS — Z79899 Other long term (current) drug therapy: Secondary | ICD-10-CM

## 2021-07-08 ENCOUNTER — Other Ambulatory Visit: Payer: Self-pay | Admitting: Family Medicine

## 2021-07-10 MED ORDER — ESOMEPRAZOLE MAGNESIUM 40 MG PO CPDR: 40.0000 mg | DELAYED_RELEASE_CAPSULE | Freq: Every day | ORAL | 3 refills | Status: DC

## 2021-07-10 NOTE — Addendum Note (Signed)
Addended by: Zenia Resides on: 07/10/2021 12:22 PM   Modules accepted: Orders

## 2021-07-10 NOTE — Telephone Encounter (Signed)
Patient calls nurse line stating Nexium has been denied by provider. The denial reason stated the medication had been previously sent in. The pharmacy dose not have a record of this. Can we resend. Please advise.

## 2021-07-10 NOTE — Telephone Encounter (Signed)
Refill sent.

## 2021-09-15 ENCOUNTER — Encounter: Payer: Self-pay | Admitting: Family Medicine

## 2021-09-16 ENCOUNTER — Other Ambulatory Visit: Payer: Self-pay

## 2021-09-16 ENCOUNTER — Other Ambulatory Visit: Payer: BC Managed Care – PPO

## 2021-09-16 DIAGNOSIS — E876 Hypokalemia: Secondary | ICD-10-CM

## 2021-09-16 NOTE — Telephone Encounter (Signed)
Patient has follow up with provider this afternoon. FYI.   Talbot Grumbling, RN

## 2021-09-17 LAB — BASIC METABOLIC PANEL
BUN/Creatinine Ratio: 13 (ref 9–23)
BUN: 10 mg/dL (ref 6–24)
CO2: 25 mmol/L (ref 20–29)
Calcium: 9.1 mg/dL (ref 8.7–10.2)
Chloride: 100 mmol/L (ref 96–106)
Creatinine, Ser: 0.8 mg/dL (ref 0.57–1.00)
Glucose: 118 mg/dL — ABNORMAL HIGH (ref 70–99)
Potassium: 3.9 mmol/L (ref 3.5–5.2)
Sodium: 139 mmol/L (ref 134–144)
eGFR: 91 mL/min/{1.73_m2} (ref >59)

## 2021-09-27 ENCOUNTER — Encounter: Payer: Self-pay | Admitting: Emergency Medicine

## 2021-09-27 ENCOUNTER — Other Ambulatory Visit: Payer: Self-pay

## 2021-09-27 ENCOUNTER — Ambulatory Visit
Admission: EM | Admit: 2021-09-27 | Discharge: 2021-09-27 | Disposition: A | Discharge status: Home / Self Care | Payer: BC Managed Care – PPO | Attending: Medical Oncology | Admitting: Medical Oncology

## 2021-09-27 DIAGNOSIS — Z87442 Personal history of urinary calculi: Secondary | ICD-10-CM | POA: Diagnosis not present

## 2021-09-27 DIAGNOSIS — R109 Unspecified abdominal pain: Secondary | ICD-10-CM

## 2021-09-27 LAB — POCT URINALYSIS DIP (MANUAL ENTRY)
Bilirubin, UA: NEGATIVE
Glucose, UA: NEGATIVE mg/dL
Ketones, POC UA: NEGATIVE mg/dL
Leukocytes, UA: NEGATIVE
Nitrite, UA: NEGATIVE
Protein Ur, POC: NEGATIVE mg/dL
Spec Grav, UA: >=1.03 — AB (ref 1.010–1.025)
Urobilinogen, UA: 1 E.U./dL
pH, UA: 5.5 (ref 5.0–8.0)

## 2021-09-27 MED ORDER — TAMSULOSIN HCL 0.4 MG PO CAPS: 0.4000 mg | ORAL_CAPSULE | Freq: Every day | ORAL | 0 refills | Status: DC

## 2021-09-27 MED ORDER — KETOROLAC TROMETHAMINE 60 MG/2ML IM SOLN
60.0000 mg | Freq: Once | INTRAMUSCULAR | Status: AC
  Administered 2021-09-27: 19:00:00 60 mg via INTRAMUSCULAR

## 2021-09-27 NOTE — ED Provider Notes (Signed)
Carla Berry    CSN: [id number] Arrival date & time: 09/27/21  1827      History   Chief Complaint Chief Complaint  Patient presents with   Dysuria   Flank Pain    HPI Carla Berry is a 50 y.o. female.   HPI  Flank Pain: Patient states that for the past day she has had severe right flank pain after she urinates. She denies fever, vomiting, hematuria.No trouble passing urine. Has history of kidney stones with many having to be surgically removed. She has tried to stay hydrated but has not taken anything for symptoms.   Past Medical History:  Diagnosis Date   Abdominal pain 11/26/2017   Chest tightness 07/15/2019   Chest wall pain 09/04/2020   Chronic cough 03/07/3761   Diastolic hypertension 06/06/5175   FIBROIDS, UTERUS 02/08/2007   Qualifier: Diagnosis of  By: McDiarmid MD, Augustina Mood of right wrist 04/04/2016   Gastric polyp 07/23/12   Pathology: Chronic inactive gastritis.  EGD by Dr Verdia Kuba (GI)   Gastritis, acute 07/16/2012   Dr Collene Mares (GI) ordered abdominal US that was normal (07/13/12).  Dr Collene Mares ordered Nuc Med Hepatobiliary Tc99 scan with cholecystikinin stimulation which was normal on 07/13/12.  Upper Endoscopy by Dr Collene Mares on 07/21/12 showed mild antral gastritis and sessile gastric polyps that were benign on histopathology     GASTROESOPHAGEAL REFLUX, NO ESOPHAGITIS 12/03/2006   Qualifier: Diagnosis of  By: Drucie Ip     Glucose intolerance 12/03/2006   Qualifier: Diagnosis of  By: McDiarmid MD, Todd     Hair loss 04/04/2016   Heavy menstrual bleeding 05/21/2018   History of nephrolithiasis 12/03/2006   Qualifier: History of  By: McDiarmid MD, Todd     Hx of hypokalemia 12/16/2018   MIGRAINE, UNSPEC., W/O INTRACTABLE MIGRAINE 12/03/2006   Qualifier: Diagnosis of  By: Drucie Ip     Myalgia, Chronic    PALPITATIONS, RECURRENT 07/31/2008   Qualifier: History of  By: McDiarmid MD, Troy Sine SMEAR, ABNORMAL 12/03/2006   Qualifier:  History of By: McDiarmid MD, Todd     Skin lesion of chest wall 11/17/2014   Suprapubic pain 11/30/2014   TOBACCO USE, QUIT 12/11/2009   Qualifier: Diagnosis of  By: Shelbie Proctor     Urinary incontinence, mixed    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   Qualifier: History of  By: McDiarmid MD, Todd      Patient Active Problem List   Diagnosis Date Noted   Depression 09/19/2020   Hypertension 08/10/2020   Grief reaction 08/10/2020   Iron deficiency 06/15/2018   Hypokalemia 05/21/2018   Snoring 04/03/2017   Myalgia, Chronic    Urinary incontinence, mixed    Diastolic hypertension 16/04/3709   Functional dyspepsia 10/12/2015   Personal history of vulvar dysplasia 62/69/4854   Metabolic syndrome 62/70/3500   Severe obesity (BMI >= 40) ([place]) 12/03/2006   RHINITIS, ALLERGIC 12/03/2006   Glucose intolerance 12/03/2006   GASTROESOPHAGEAL REFLUX, NO ESOPHAGITIS 12/03/2006    Past Surgical History:  Procedure Laterality Date   CESAREAN SECTION     x 2   COLONOSCOPY N/A 02/08/2013   Procedure: COLONOSCOPY;  Surgeon: Beryle Beams, MD;  Location: [facility];  Service: Endoscopy;  Laterality: N/A;   TONSILLECTOMY     TUBAL LIGATION     VULVECTOMY PARTIAL  08/2004   For VIN-II  OB History   No obstetric history on file.      Home Medications    Prior to Admission medications   Medication Sig Start Date End Date Taking? Authorizing Provider  amLODipine (NORVASC) 10 MG tablet TAKE 1 TABLET BY MOUTH EVERY DAY 07/08/21   McDiarmid, Blane Ohara, MD  esomeprazole (NEXIUM) 40 MG capsule Take 1 capsule (40 mg total) by mouth daily. 07/10/21   Zenia Resides, MD  triamterene-hydrochlorothiazide (MAXZIDE-25) 37.5-25 MG tablet Take 0.5 tablets by mouth daily. 12/17/20   McDiarmid, Blane Ohara, MD    Family History Family History  Problem Relation Age of Onset   Hypertension Mother    Diabetes Mother    Nephrolithiasis  Mother    Stroke Mother    Early death Father        ?Encephalitis   Factor V Leiden deficiency Maternal Grandmother    Stroke Maternal Grandfather     Social History Social History   Tobacco Use   Smoking status: Former    Packs/day: 1.00    Years: 15.00    Pack years: 15.00    Types: Cigarettes    Start date: 10/07/1987    Quit date: 07/07/2003    Years since quitting: 18.2   Smokeless tobacco: Never  Substance Use Topics   Alcohol use: No    Alcohol/week: 0.0 standard drinks   Drug use: No     Allergies   Codeine, Prednisone, and Pregabalin   Review of Systems Review of Systems  As stated above in HPI Physical Exam Triage Vital Signs ED Triage Vitals [09/27/21 1853]  Enc Vitals Group     BP      Pulse      Resp      Temp      Temp src      SpO2      Weight      Height      Head Circumference      Peak Flow      Pain Score 8     Pain Loc      Pain Edu?      Excl. in [place]?    No data found.  Updated Vital Signs There were no vitals taken for this visit.  Physical Exam Vitals and nursing note reviewed.  Constitutional:      General: She is in acute distress (mild due to pain).     Appearance: Normal appearance. She is obese. She is not ill-appearing, toxic-appearing or diaphoretic.  HENT:     Head: Normocephalic and atraumatic.  Eyes:     General: No scleral icterus.    Extraocular Movements: Extraocular movements intact.     Pupils: Pupils are equal, round, and reactive to light.  Cardiovascular:     Rate and Rhythm: Normal rate and regular rhythm.     Heart sounds: Normal heart sounds.  Pulmonary:     Effort: Pulmonary effort is normal.     Breath sounds: Normal breath sounds.  Abdominal:     General: Bowel sounds are normal. There is no distension.     Palpations: Abdomen is soft. There is no mass.     Tenderness: There is no abdominal tenderness. There is right CVA tenderness. There is no left CVA tenderness, guarding or rebound.      Hernia: No hernia is present.  Skin:    General: Skin is warm.     Coloration: Skin is not jaundiced.  Neurological:     Mental Status:  She is alert and oriented to person, place, and time.     UC Treatments / Results  Labs (all labs ordered are listed, but only abnormal results are displayed) Labs Reviewed  POCT URINALYSIS DIP (MANUAL ENTRY) - Abnormal; Notable for the following components:      Result Value   Spec Grav, UA >=1.030 (*)    Blood, UA large (*)    All other components within normal limits    EKG   Radiology No results found.  Procedures Procedures (including critical care time)  Medications Ordered in UC Medications  ketorolac (TORADOL) injection 60 mg (has no administration in time range)    Initial Impression / Assessment and Plan / UC Course  I have reviewed the triage vital signs and the nursing notes.  Pertinent labs & imaging results that were available during my care of the patient were reviewed by me and considered in my medical decision making (see chart for details).     New. Given UA results, history of stones, physical examination findings, and HPI this likely is a kidney stone which I discussed with patient. Unable to obtain KUB due to lack of x ray table per X ray technician. As she is not having any fevers, vomiting, intolerable pain, or trouble with urine flow we discussed ER vs trial of fluids, Toradol and flomax. She elects trial of outpatient medications. Reviewed red flag signs and symptoms. Follow up PRN.   Final Clinical Impressions(s) / UC Diagnoses   Final diagnoses:  None   Discharge Instructions   None    ED Prescriptions   None    PDMP not reviewed this encounter.   Hughie Closs, [place] 09/27/21 1921

## 2021-09-27 NOTE — ED Triage Notes (Signed)
Pt here with right flank pain that dulls when resting but is very painful when urinating. States it is hard to urinate. Pt has a long hx of kidney stones.

## 2021-10-04 ENCOUNTER — Other Ambulatory Visit: Payer: Self-pay

## 2021-10-04 ENCOUNTER — Ambulatory Visit (INDEPENDENT_AMBULATORY_CARE_PROVIDER_SITE_OTHER): Payer: BC Managed Care – PPO

## 2021-10-04 ENCOUNTER — Ambulatory Visit: Payer: BC Managed Care – PPO

## 2021-10-04 DIAGNOSIS — Z23 Encounter for immunization: Secondary | ICD-10-CM | POA: Diagnosis not present

## 2021-12-08 ENCOUNTER — Other Ambulatory Visit: Payer: Self-pay | Admitting: Family Medicine

## 2021-12-08 DIAGNOSIS — E876 Hypokalemia: Secondary | ICD-10-CM

## 2021-12-08 DIAGNOSIS — I10 Essential (primary) hypertension: Secondary | ICD-10-CM

## 2022-03-10 ENCOUNTER — Telehealth: Payer: Self-pay

## 2022-03-10 NOTE — Telephone Encounter (Signed)
Patient calls nurse line reporting infection in right breast.   Patient reports symptoms started memorial day weekend with itching around right nipple.   Patient reports ~ 1 week later her nipple was very tender to touch and swollen.   Patient reports this past Saturday 6/3 she noticed her nipple had been draining yellow puss.   Patient denies any fevers, body aches or chills. Patient reports she feels it is healing, however would like to be seen to make sure. Patients only availability is Thursday morning.   Patient scheduled for evaluation.   Red flags discussed with patient.

## 2022-03-11 ENCOUNTER — Encounter: Payer: Self-pay | Admitting: *Deleted

## 2022-03-12 NOTE — Progress Notes (Signed)
    SUBJECTIVE:   CHIEF COMPLAINT / HPI:   Right breast pain and discharge: Patient with 1 to 2 weeks of tender, swollen, painful right nipple with some yellow purulence draining starting on 6/3.  She also noted some red-tinged drainage.  She states that this morning she has not had any further drainage.  She thinks that something "popped" when she was taken off her bra and that there may have been an abscess.  She denies any fevers.  She had a previous mammogram was in 04/20/2020 which was normal.  PERTINENT  PMH / PSH: None relevant  OBJECTIVE:   BP 113/90   Pulse 74   Temp 97.7 F (36.5 C)   Ht '5\' 3"'$  (1.6 m)   Wt 258 lb (117 kg)   LMP 03/10/2022   SpO2 100%   BMI 45.70 kg/m  \  General: NAD, pleasant, able to participate in exam Respiratory: No respiratory distress Chest/Breast: Right nipple with no overlying erythema, no drainage able to be expressed, no palpable masses.  She has some mild discomfort when palpating around the nipple.  No nipple retraction noted., chaperone Alexis Psych: Normal affect and mood  ASSESSMENT/PLAN:   Nipple drainage: 51 year old female with nipple drainage of her right breast for the past week or 2.  This has mostly resolved but did have sometimes with some purulence and some red-tinged drainage.  She did have a normal mammogram 2 years ago but is due for one.  On physical exam at this time there is no drainage to be expressed, no palpable masses, mild discomfort when palpating around the area of the nipple in the region where she notes the previous drainage.  She is not having any fevers.  No indication for antibiotics at this time.  I did discuss with her that we do need to get a mammogram.  If she has further bouts of this instructed her to make a same-day appointment so we can visualize the drainage to make a better determination.  We will await the mammogram.   Lurline Del, [place]

## 2022-03-13 ENCOUNTER — Ambulatory Visit: Payer: BC Managed Care – PPO | Admitting: Family Medicine

## 2022-03-13 VITALS — BP 113/90 | HR 74 | Temp 97.7°F | Ht 63.0 in | Wt 258.0 lb

## 2022-03-13 DIAGNOSIS — N61 Mastitis without abscess: Secondary | ICD-10-CM

## 2022-03-13 DIAGNOSIS — Z1231 Encounter for screening mammogram for malignant neoplasm of breast: Secondary | ICD-10-CM | POA: Diagnosis not present

## 2022-03-13 NOTE — Patient Instructions (Signed)
We are ordering a mammogram for you.  We will discuss the results when it returns.  If you have any further bouts of nipple drainage I would like for you to make a same-day appointment so we can visualize the drainage to make a better determination of neck steps.  I do not think we need antibiotics at this time.  You can use warm compresses on the area if it is uncomfortable and that should help.  If it continues to improve we do not need to follow-up other than getting the mammogram.  If it worsens or changes at all please return.

## 2022-03-18 ENCOUNTER — Ambulatory Visit
Admission: RE | Admit: 2022-03-18 | Discharge: 2022-03-18 | Disposition: A | Discharge status: Home / Self Care | Payer: BC Managed Care – PPO | Source: Ambulatory Visit | Attending: Family Medicine | Admitting: Family Medicine

## 2022-03-18 DIAGNOSIS — Z1231 Encounter for screening mammogram for malignant neoplasm of breast: Secondary | ICD-10-CM

## 2022-05-08 ENCOUNTER — Other Ambulatory Visit: Payer: Self-pay | Admitting: Family Medicine

## 2022-05-08 DIAGNOSIS — K219 Gastro-esophageal reflux disease without esophagitis: Secondary | ICD-10-CM

## 2022-07-07 ENCOUNTER — Other Ambulatory Visit: Payer: Self-pay | Admitting: Family Medicine

## 2022-07-07 DIAGNOSIS — Z79899 Other long term (current) drug therapy: Secondary | ICD-10-CM

## 2022-08-20 ENCOUNTER — Other Ambulatory Visit: Payer: Self-pay

## 2022-09-08 LAB — HEPATITIS B SURFACE ANTIBODY,QUALITATIVE

## 2022-09-22 DIAGNOSIS — F331 Major depressive disorder, recurrent, moderate: Secondary | ICD-10-CM | POA: Diagnosis not present

## 2022-10-15 ENCOUNTER — Other Ambulatory Visit: Payer: Self-pay

## 2022-10-16 LAB — HEPATITIS B SURFACE ANTIBODY,QUALITATIVE: Hep B Surface Ab, Qual: NONREACTIVE

## 2022-10-20 DIAGNOSIS — F331 Major depressive disorder, recurrent, moderate: Secondary | ICD-10-CM | POA: Diagnosis not present

## 2022-11-26 ENCOUNTER — Ambulatory Visit: Payer: Self-pay | Admitting: Physician Assistant

## 2022-11-26 VITALS — BP 146/92 | HR 84 | Temp 97.7°F | Resp 16

## 2022-11-26 DIAGNOSIS — N390 Urinary tract infection, site not specified: Secondary | ICD-10-CM

## 2022-11-26 DIAGNOSIS — F331 Major depressive disorder, recurrent, moderate: Secondary | ICD-10-CM | POA: Diagnosis not present

## 2022-11-26 DIAGNOSIS — R319 Hematuria, unspecified: Secondary | ICD-10-CM

## 2022-11-26 LAB — POCT URINALYSIS DIPSTICK
Bilirubin, UA: NEGATIVE
Glucose, UA: NEGATIVE
Ketones, UA: NEGATIVE
Leukocytes, UA: NEGATIVE
Nitrite, UA: NEGATIVE
Protein, UA: NEGATIVE
Spec Grav, UA: 1.015 (ref 1.010–1.025)
Urobilinogen, UA: 0.2 E.U./dL
pH, UA: 6 (ref 5.0–8.0)

## 2022-11-26 MED ORDER — CEPHALEXIN 500 MG PO CAPS: 500.0000 mg | ORAL_CAPSULE | Freq: Four times a day (QID) | ORAL | 0 refills | Status: DC

## 2022-11-26 NOTE — Progress Notes (Signed)
   Subjective:    Patient ID: Carla Berry, female    DOB: 1971-05-27, 52 y.o.   MRN: [id number]  Urinary Tract Infection  This is a new problem. The current episode started 1 to 4 weeks ago. The problem occurs every urination. The problem has been gradually worsening. The quality of the pain is described as aching. The pain is moderate. There has been no fever. There is A history of pyelonephritis. Associated symptoms include flank pain, frequency, nausea and urgency. Pertinent negatives include no chills, discharge, hematuria, sweats or vomiting. Treatments tried: cranberry juice. The treatment provided no relief. Her past medical history is significant for recurrent UTIs.      Review of Systems  Constitutional:  Negative for chills.  Cardiovascular:  Negative for chest pain, palpitations and leg swelling.  Gastrointestinal:  Positive for nausea. Negative for vomiting.  Genitourinary:  Positive for dysuria, flank pain, frequency and urgency. Negative for hematuria.       Objective:   Physical Exam Vitals reviewed.  Constitutional:      Appearance: Normal appearance.  Cardiovascular:     Rate and Rhythm: Normal rate and regular rhythm.  Abdominal:     Tenderness: There is right CVA tenderness. There is no guarding.     Comments: "Sensitive over bladder area", no rebound or severe pain.  Musculoskeletal:     Cervical back: Normal range of motion.  Neurological:     Mental Status: She is alert.          Assessment & Plan: 1. Dysuria   Blood pressure is elevated, vital signs are otherwise within normal limits. Examination and history suggest UTI.  Patient encouraged to increase fluids. To continue to use cranberry juice. Rx for Keflex 544m four times daily given. Patient encouraged to use yogurt while taking the Keflex. Patient to return to clinic if any worsening of condition or problem.

## 2022-11-27 ENCOUNTER — Encounter: Payer: Self-pay | Admitting: Physician Assistant

## 2022-11-27 NOTE — Patient Instructions (Signed)
Blood pressure is elevated, vital signs are otherwise within normal limits. Examination and history suggest UTI.  Patient encouraged to increase fluids. To continue to use cranberry juice. Rx for Keflex 547m four times daily given. Patient encouraged to use yogurt while taking the Keflex. Patient to return to clinic if any worsening of condition or problem.

## 2022-12-01 ENCOUNTER — Other Ambulatory Visit: Payer: Self-pay | Admitting: Family Medicine

## 2022-12-01 DIAGNOSIS — I10 Essential (primary) hypertension: Secondary | ICD-10-CM

## 2022-12-01 DIAGNOSIS — E876 Hypokalemia: Secondary | ICD-10-CM

## 2022-12-25 ENCOUNTER — Encounter: Payer: Self-pay | Admitting: Family Medicine

## 2022-12-25 ENCOUNTER — Ambulatory Visit (INDEPENDENT_AMBULATORY_CARE_PROVIDER_SITE_OTHER): Payer: BC Managed Care – PPO | Admitting: Family Medicine

## 2022-12-25 VITALS — BP 135/91 | HR 88 | Ht 63.0 in | Wt 262.6 lb

## 2022-12-25 DIAGNOSIS — R739 Hyperglycemia, unspecified: Secondary | ICD-10-CM | POA: Diagnosis not present

## 2022-12-25 DIAGNOSIS — N6452 Nipple discharge: Secondary | ICD-10-CM | POA: Diagnosis not present

## 2022-12-25 DIAGNOSIS — N921 Excessive and frequent menstruation with irregular cycle: Secondary | ICD-10-CM | POA: Diagnosis not present

## 2022-12-25 DIAGNOSIS — E611 Iron deficiency: Secondary | ICD-10-CM

## 2022-12-25 DIAGNOSIS — Z79899 Other long term (current) drug therapy: Secondary | ICD-10-CM | POA: Diagnosis not present

## 2022-12-25 DIAGNOSIS — E8881 Metabolic syndrome: Secondary | ICD-10-CM | POA: Diagnosis not present

## 2022-12-25 DIAGNOSIS — C519 Malignant neoplasm of vulva, unspecified: Secondary | ICD-10-CM

## 2022-12-25 DIAGNOSIS — I10 Essential (primary) hypertension: Secondary | ICD-10-CM

## 2022-12-25 LAB — POCT GLYCOSYLATED HEMOGLOBIN (HGB A1C): Hemoglobin A1C: 6.3 % — AB (ref 4.0–5.6)

## 2022-12-25 NOTE — Patient Instructions (Addendum)
I believe the discharge from the right nipple is from ductal dilation.  This is a benign condition, but the dilated ducts can be removed by a surgeon, if you want to.   We are checking your electrolytes and and renal function today.    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

## 2022-12-26 ENCOUNTER — Encounter: Payer: Self-pay | Admitting: Family Medicine

## 2022-12-26 DIAGNOSIS — N6452 Nipple discharge: Secondary | ICD-10-CM

## 2022-12-26 DIAGNOSIS — C519 Malignant neoplasm of vulva, unspecified: Secondary | ICD-10-CM | POA: Insufficient documentation

## 2022-12-26 HISTORY — DX: Malignant neoplasm of vulva, unspecified: C51.9

## 2022-12-26 HISTORY — DX: Nipple discharge: N64.52

## 2022-12-26 LAB — BASIC METABOLIC PANEL
BUN/Creatinine Ratio: 13 (ref 9–23)
BUN: 13 mg/dL (ref 6–24)
CO2: 23 mmol/L (ref 20–29)
Calcium: 9 mg/dL (ref 8.7–10.2)
Chloride: 102 mmol/L (ref 96–106)
Creatinine, Ser: 0.98 mg/dL (ref 0.57–1.00)
Glucose: 94 mg/dL (ref 70–99)
Potassium: 3.8 mmol/L (ref 3.5–5.2)
Sodium: 140 mmol/L (ref 134–144)
eGFR: 71 mL/min/{1.73_m2} (ref >59)

## 2022-12-26 LAB — CBC
Hematocrit: 41.1 % (ref 34.0–46.6)
Hemoglobin: 13.1 g/dL (ref 11.1–15.9)
MCH: 24.7 pg — ABNORMAL LOW (ref 26.6–33.0)
MCHC: 31.9 g/dL (ref 31.5–35.7)
MCV: 77 fL — ABNORMAL LOW (ref 79–97)
Platelets: 453 10*3/uL — ABNORMAL HIGH (ref 150–450)
RBC: 5.31 x10E6/uL — ABNORMAL HIGH (ref 3.77–5.28)
RDW: 16.6 % — ABNORMAL HIGH (ref 11.7–15.4)
WBC: 10.5 10*3/uL (ref 3.4–10.8)

## 2022-12-26 NOTE — Assessment & Plan Note (Signed)
Lab Results  Component Value Date   HGBA1C 6.3 (A) 12/25/2022   Remains in pre-diabetic state Lifestyle related

## 2022-12-26 NOTE — Progress Notes (Signed)
Carla Berry is alone Sources of clinical information for visit is/are patient. Nursing assessment for this office visit was reviewed with the patient for accuracy and revision.     Previous Report(s) Reviewed: none     12/25/2022    2:08 PM  Depression screen PHQ 2/9  Decreased Interest 1  Down, Depressed, Hopeless 1  PHQ - 2 Score 2  Altered sleeping 1  Tired, decreased energy 1  Change in appetite 1  Feeling bad or failure about yourself  1  Trouble concentrating 0  Moving slowly or fidgety/restless 0  Suicidal thoughts 0  PHQ-9 Score 6  Difficult doing work/chores Somewhat difficult   [place] Office Visit from 12/25/2022 in [place] Office Visit from 03/13/2022 in [place] Office Visit from 12/13/2020 in [place]  Thoughts that you would be better off dead, or of hurting yourself in some way Not at all Not at all Not at all  PHQ-9 Total Score 6 4 7           07/14/2019    2:17 PM 09/18/2017    4:35 PM 04/16/2017   10:19 AM 05/15/2016    8:55 AM 04/10/2016   10:56 AM  Fall Risk   Falls in the past year? 0 No No No No       12/25/2022    2:08 PM 03/13/2022    9:48 AM 12/13/2020    3:50 PM  PHQ9 SCORE ONLY  PHQ-9 Total Score 6 4 7     There are no preventive care reminders to display for this patient.  Health Maintenance Due  Topic Date Due   PAP SMEAR-Modifier  04/16/2022   COVID-19 Vaccine (4 - 2023-24 season) 06/06/2022      History/P.E. limitations: none  There are no preventive care reminders to display for this patient. There are no preventive care reminders to display for this patient.  Health Maintenance Due  Topic Date Due   PAP SMEAR-Modifier  04/16/2022   COVID-19 Vaccine (4 - 2023-24 season) 06/06/2022     Chief Complaint  Patient presents with   Annual Exam      --------------------------------------------------------------------------------------------------------------------------------------------- Visit Problem List with A/P  Hypertension Established problem. Adequate blood pressure control.  No evidence of new end organ damage.  Tolerating medication without significant adverse effects.  Plan to continue current blood pressure medication regiment.  Amlodipine 10 mg daily and Maxide one daily.  Basic Metabolic Panel:    Component Value Date/Time   NA 140 12/25/2022 1705   K 3.8 12/25/2022 1705   CL 102 12/25/2022 1705   CO2 23 12/25/2022 1705   BUN 13 12/25/2022 1705   CREATININE 0.98 12/25/2022 1705   CREATININE 0.80 08/12/2016 1526   GLUCOSE 94 12/25/2022 1705   GLUCOSE 95 11/26/2017 1553   CALCIUM 9.0 [id number]     Metabolic syndrome Lab Results  Component Value Date   HGBA1C 6.3 (A) 12/25/2022   Remains in pre-diabetic state Lifestyle related   Iron deficiency CBC:    Component Value Date/Time   WBC 10.5 12/25/2022 1705   WBC 8.1 11/26/2017 1553   HGB 13.1 12/25/2022 1705   HCT 41.1 12/25/2022 1705   PLT 453 (H) 12/25/2022 1705   MCV 77 (L) 12/25/2022 1705   NEUTROABS 5.9 03/19/2017 1114   LYMPHSABS 2.4 03/19/2017 1114   MONOABS 1.0 02/05/2013 1924   EOSABS 0.1 03/19/2017 1114   BASOSABS 0.0 03/19/2017 1114  Given slight increase platelets, microcytic RBCs, wide RDW, I suspect iron-deficiency without anemia is still present given patient's reported menoprrhagia Continue iron supplementation.    Vulvar carcinoma [facility]) Ms Pessin was referred for GYN consultation to provide surveillance for VAN and screening for cerivcal cancer  Nipple discharge Present since last June when evaluated at [facility] with normal screening MM  Patient expresses a formed white exudate from some right breast nipple duct opening once or twice a week. No other material expressed or drains.  Non-painful.  Exam supervised by CMA,  Tashira Expression manipulation by patient produce  from two minute opening of the right nipple  firm formed white, tubular formed exudates, one from each opening, each about 2 mm diameter.  No other material was expressed.  There were no obvious palpable masses in either breast.  No overt breast contour diformities.  No obvious changes to the skin of areola.   I suspect the patient has a couple benignly dilated milk ducts which are expressing some material that clogs them, then is able to be expressed.   Plan Diagnostic MM Continue periodic hand expression of built up matter.   Patient declined referral to surgeon at this time.

## 2022-12-26 NOTE — Assessment & Plan Note (Addendum)
Present since last June when evaluated at [facility] with normal screening MM  Patient expresses a formed white exudate from some right breast nipple duct opening once or twice a week. No other material expressed or drains.  Non-painful.  Exam supervised by CMA, Tashira Expression manipulation by patient produce  from two minute opening of the right nipple  firm formed white, tubular formed exudates, one from each opening, each about 2 mm diameter.  No other material was expressed.  There were no obvious palpable masses in either breast.  No overt breast contour diformities.  No obvious changes to the skin of areola.   I suspect the patient has a couple benignly dilated milk ducts which are expressing some material that clogs them, then is able to be expressed.   Plan Diagnostic MM Continue periodic hand expression of built up matter.   Patient declined referral to surgeon at this time.

## 2022-12-26 NOTE — Assessment & Plan Note (Addendum)
CBC:    Component Value Date/Time   WBC 10.5 12/25/2022 1705   WBC 8.1 11/26/2017 1553   HGB 13.1 12/25/2022 1705   HCT 41.1 12/25/2022 1705   PLT 453 (H) 12/25/2022 1705   MCV 77 (L) 12/25/2022 1705   NEUTROABS 5.9 03/19/2017 1114   LYMPHSABS 2.4 03/19/2017 1114   MONOABS 1.0 02/05/2013 1924   EOSABS 0.1 03/19/2017 1114   BASOSABS 0.0 03/19/2017 1114   Given slight increase platelets, microcytic RBCs, wide RDW, I suspect iron-deficiency without anemia is still present given patient's reported menoprrhagia Continue iron supplementation.

## 2022-12-26 NOTE — Assessment & Plan Note (Signed)
Carla Berry was referred for GYN consultation to provide surveillance for VAN and screening for cerivcal cancer

## 2022-12-26 NOTE — Assessment & Plan Note (Signed)
Established problem. Adequate blood pressure control.  No evidence of new end organ damage.  Tolerating medication without significant adverse effects.  Plan to continue current blood pressure medication regiment.  Amlodipine 10 mg daily and Maxide one daily.  Basic Metabolic Panel:    Component Value Date/Time   NA 140 12/25/2022 1705   K 3.8 12/25/2022 1705   CL 102 12/25/2022 1705   CO2 23 12/25/2022 1705   BUN 13 12/25/2022 1705   CREATININE 0.98 12/25/2022 1705   CREATININE 0.80 08/12/2016 1526   GLUCOSE 94 12/25/2022 1705   GLUCOSE 95 11/26/2017 1553   CALCIUM 9.0 12/25/2022 1705

## 2023-01-09 DIAGNOSIS — F331 Major depressive disorder, recurrent, moderate: Secondary | ICD-10-CM | POA: Diagnosis not present

## 2023-01-18 ENCOUNTER — Ambulatory Visit
Admission: EM | Admit: 2023-01-18 | Discharge: 2023-01-18 | Disposition: A | Discharge status: Home / Self Care | Payer: BC Managed Care – PPO

## 2023-01-18 DIAGNOSIS — M722 Plantar fascial fibromatosis: Secondary | ICD-10-CM

## 2023-01-18 NOTE — ED Triage Notes (Signed)
Patient presents to UC for right ankle shooting pain x 2 weeks. Heel pain x 4 months. Taking Tylenol and using a brace. Hx of plantar fascitis. Thinks she may have twisted her ankle.

## 2023-01-18 NOTE — ED Provider Notes (Addendum)
Carla Berry    CSN: [id number] Arrival date & time: 01/18/23  1251      History   Chief Complaint Chief Complaint  Patient presents with   Foot Pain    HPI Carla Berry is a 52 y.o. female.    Foot Pain    Presents to urgent care with complaint of right ankle pain x 2 weeks.  She describes the ankle pain as "shooting".  She also complains of heel pain x 4 months.  Endorses history of plantar fasciitis.  Thinks she may have twisted her ankle.  Past Medical History:  Diagnosis Date   Abdominal pain 11/26/2017   Chest tightness 07/15/2019   Chest wall pain 09/04/2020   Chronic cough 10/12/2015   Diastolic hypertension 11/06/2015   FIBROIDS, UTERUS 02/08/2007   Qualifier: Diagnosis of  By: McDiarmid MD, Yong Channel of right wrist 04/04/2016   Gastric polyp 07/23/12   Pathology: Chronic inactive gastritis.  EGD by Dr Arty Baumgartner (GI)   Gastritis, acute 07/16/2012   Dr Loreta Ave (GI) ordered abdominal US that was normal (07/13/12).  Dr Loreta Ave ordered Nuc Med Hepatobiliary Tc99 scan with cholecystikinin stimulation which was normal on 07/13/12.  Upper Endoscopy by Dr Loreta Ave on 07/21/12 showed mild antral gastritis and sessile gastric polyps that were benign on histopathology     GASTROESOPHAGEAL REFLUX, NO ESOPHAGITIS 12/03/2006   Qualifier: Diagnosis of  By: Haydee Salter     Glucose intolerance 12/03/2006   Qualifier: Diagnosis of  By: McDiarmid MD, Todd     Hair loss 04/04/2016   Heavy menstrual bleeding 05/21/2018   History of nephrolithiasis 12/03/2006   Qualifier: History of  By: McDiarmid MD, Todd     Hx of hypokalemia 12/16/2018   MIGRAINE, UNSPEC., W/O INTRACTABLE MIGRAINE 12/03/2006   Qualifier: Diagnosis of  By: Haydee Salter     Myalgia, Chronic    PALPITATIONS, RECURRENT 07/31/2008   Qualifier: History of  By: McDiarmid MD, Alla German SMEAR, ABNORMAL 12/03/2006   Qualifier: History of By: McDiarmid MD, Todd     Skin lesion of chest wall 11/17/2014    Suprapubic pain 11/30/2014   TOBACCO USE, QUIT 12/11/2009   Qualifier: Diagnosis of  By: Dewitt Rota     Urinary incontinence, mixed    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   Qualifier: History of  By: McDiarmid MD, Todd      Patient Active Problem List   Diagnosis Date Noted   Nipple discharge 12/26/2022   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   Depression 09/19/2020   Hypertension 08/10/2020   Iron deficiency 06/15/2018   Snoring 04/03/2017   Urinary incontinence, mixed    Functional dyspepsia 10/12/2015   Personal history of vulvar dysplasia 11/30/2014   Metabolic syndrome 11/17/2014   Severe obesity (BMI >= 40) 12/03/2006   RHINITIS, ALLERGIC 12/03/2006   GASTROESOPHAGEAL REFLUX, NO ESOPHAGITIS 12/03/2006    Past Surgical History:  Procedure Laterality Date   CESAREAN SECTION     x 2   COLONOSCOPY N/A 02/08/2013   Procedure: COLONOSCOPY;  Surgeon: Theda Belfast, MD;  Location: [facility] END[facility]COPY;  Service: Endoscopy;  Laterality: N/A;   TONSILLECTOMY     TUBAL LIGATION     VULVECTOMY PARTIAL  08/2004   For VIN-II    OB History   No obstetric history on file.      Home Medications    Prior  to Admission medications   Medication Sig Start Date End Date Taking? Authorizing Provider  amLODipine (NORVASC) 10 MG tablet TAKE 1 TABLET BY MOUTH EVERY DAY 07/07/22   McDiarmid, Leighton Roach, MD  cetirizine (ZYRTEC) 10 MG tablet Take 10 mg by mouth daily.    [provider]  esomeprazole (NEXIUM) 40 MG capsule TAKE 1 CAPSULE (40 MG TOTAL) BY MOUTH DAILY. 05/08/22   McDiarmid, Leighton Roach, MD  triamterene-hydrochlorothiazide (MAXZIDE-25) 37.5-25 MG tablet TAKE 1/2 TABLET BY MOUTH EVERY DAY 12/01/22   McDiarmid, Leighton Roach, MD    Family History Family History  Problem Relation Age of Onset   Hypertension Mother    Diabetes Mother    Nephrolithiasis Mother    Stroke Mother    Early death Father        ?Encephalitis    Factor V Leiden deficiency Maternal Grandmother    Stroke Maternal Grandfather     Social History Social History   Tobacco Use   Smoking status: Former    Packs/day: 1.00    Years: 15.00    Additional pack years: 0.00    Total pack years: 15.00    Types: Cigarettes    Start date: 10/07/1987    Quit date: 07/07/2003    Years since quitting: 19.5   Smokeless tobacco: Never  Substance Use Topics   Alcohol use: No    Alcohol/week: 0.0 standard drinks of alcohol   Drug use: No     Allergies   Codeine, Prednisone, and Pregabalin   Review of Systems Review of Systems   Physical Exam Triage Vital Signs ED Triage Vitals  Enc Vitals Group     BP 01/18/23 1313 130/83     Pulse Rate 01/18/23 1313 79     Resp 01/18/23 1313 18     Temp 01/18/23 1313 98.4 F (36.9 C)     Temp Source 01/18/23 1313 Temporal     SpO2 01/18/23 1313 95 %     Weight --      Height --      Head Circumference --      Peak Flow --      Pain Score 01/18/23 1312 5     Pain Loc --      Pain Edu? --      Excl. in GC? --    No data found.  Updated Vital Signs BP 130/83 (BP Location: Left Arm)   Pulse 79   Temp 98.4 F (36.9 C) (Temporal)   Resp 18   LMP 12/04/2022   SpO2 95%   Visual Acuity Right Eye Distance:   Left Eye Distance:   Bilateral Distance:    Right Eye Near:   Left Eye Near:    Bilateral Near:     Physical Exam Vitals reviewed.  Constitutional:      Appearance: Normal appearance.  Skin:    General: Skin is warm and dry.  Neurological:     General: No focal deficit present.     Mental Status: She is alert and oriented to person, place, and time.  Psychiatric:        Mood and Affect: Mood normal.        Behavior: Behavior normal.      UC Treatments / Results  Labs (all labs ordered are listed, but only abnormal results are displayed) Labs Reviewed - No data to display  EKG   Radiology No results found.  Procedures Procedures (including critical care  time)  Medications Ordered in UC  Medications - No data to display  Initial Impression / Assessment and Plan / UC Course  I have reviewed the triage vital signs and the nursing notes.  Pertinent labs & imaging results that were available during my care of the patient were reviewed by me and considered in my medical decision making (see chart for details).   Likely plantar fasciitis.  Recommended use of cold therapy, freeze water bottle and roll under foot while putting pressure.  Suggested use of footwear having good arch support or replacing current arch supports with orthotics (Dr. Margart Sickles kiosk) also suggested she could follow-up with evaluation by podiatry who would have additional treatments.  Counseled patient on potential for adverse effects with medications prescribed/recommended today, ER and return-to-clinic precautions discussed, patient verbalized understanding and agreement with care plan.   Final Clinical Impressions(s) / UC Diagnoses   Final diagnoses:  None   Discharge Instructions   None    ED Prescriptions   None    PDMP not reviewed this encounter.   Charma Igo, FNP 01/18/23 1326    Charma Igo, FNP 01/18/23 1327

## 2023-01-18 NOTE — Discharge Instructions (Signed)
Follow up here or with your primary care provider if your symptoms are worsening or not improving.     

## 2023-02-02 DIAGNOSIS — F331 Major depressive disorder, recurrent, moderate: Secondary | ICD-10-CM | POA: Diagnosis not present

## 2023-02-13 ENCOUNTER — Other Ambulatory Visit: Payer: Self-pay | Admitting: Family Medicine

## 2023-02-13 ENCOUNTER — Ambulatory Visit
Admission: RE | Admit: 2023-02-13 | Discharge: 2023-02-13 | Disposition: A | Discharge status: Home / Self Care | Payer: BC Managed Care – PPO | Source: Ambulatory Visit | Attending: Family Medicine | Admitting: Family Medicine

## 2023-02-13 DIAGNOSIS — N6452 Nipple discharge: Secondary | ICD-10-CM

## 2023-02-13 DIAGNOSIS — R739 Hyperglycemia, unspecified: Secondary | ICD-10-CM

## 2023-02-13 DIAGNOSIS — Z79899 Other long term (current) drug therapy: Secondary | ICD-10-CM

## 2023-02-13 DIAGNOSIS — N6310 Unspecified lump in the right breast, unspecified quadrant: Secondary | ICD-10-CM

## 2023-02-13 DIAGNOSIS — E611 Iron deficiency: Secondary | ICD-10-CM

## 2023-02-13 DIAGNOSIS — N63 Unspecified lump in unspecified breast: Secondary | ICD-10-CM | POA: Diagnosis not present

## 2023-02-13 DIAGNOSIS — I10 Essential (primary) hypertension: Secondary | ICD-10-CM

## 2023-02-13 DIAGNOSIS — N921 Excessive and frequent menstruation with irregular cycle: Secondary | ICD-10-CM

## 2023-02-13 DIAGNOSIS — C519 Malignant neoplasm of vulva, unspecified: Secondary | ICD-10-CM

## 2023-02-13 DIAGNOSIS — E8881 Metabolic syndrome: Secondary | ICD-10-CM

## 2023-02-16 ENCOUNTER — Encounter: Payer: Self-pay | Admitting: Family Medicine

## 2023-02-17 ENCOUNTER — Ambulatory Visit
Admission: RE | Admit: 2023-02-17 | Discharge: 2023-02-17 | Disposition: A | Discharge status: Home / Self Care | Payer: BC Managed Care – PPO | Source: Ambulatory Visit | Attending: Family Medicine | Admitting: Family Medicine

## 2023-02-17 DIAGNOSIS — N6310 Unspecified lump in the right breast, unspecified quadrant: Secondary | ICD-10-CM

## 2023-02-17 DIAGNOSIS — N6081 Other benign mammary dysplasias of right breast: Secondary | ICD-10-CM | POA: Diagnosis not present

## 2023-02-17 DIAGNOSIS — N63 Unspecified lump in unspecified breast: Secondary | ICD-10-CM | POA: Diagnosis not present

## 2023-02-17 DIAGNOSIS — D241 Benign neoplasm of right breast: Secondary | ICD-10-CM | POA: Diagnosis not present

## 2023-02-17 HISTORY — PX: BREAST BIOPSY: SHX20

## 2023-02-18 ENCOUNTER — Other Ambulatory Visit: Payer: Self-pay | Admitting: Family Medicine

## 2023-02-18 ENCOUNTER — Encounter: Payer: Self-pay | Admitting: Family Medicine

## 2023-02-18 DIAGNOSIS — N6452 Nipple discharge: Secondary | ICD-10-CM

## 2023-02-20 ENCOUNTER — Ambulatory Visit: Payer: Self-pay | Admitting: Podiatry

## 2023-02-25 ENCOUNTER — Ambulatory Visit
Admission: RE | Admit: 2023-02-25 | Discharge: 2023-02-25 | Disposition: A | Discharge status: Home / Self Care | Payer: BC Managed Care – PPO | Source: Ambulatory Visit | Attending: Family Medicine | Admitting: Family Medicine

## 2023-02-25 DIAGNOSIS — N6452 Nipple discharge: Secondary | ICD-10-CM

## 2023-02-25 DIAGNOSIS — D241 Benign neoplasm of right breast: Secondary | ICD-10-CM | POA: Diagnosis not present

## 2023-02-25 DIAGNOSIS — F331 Major depressive disorder, recurrent, moderate: Secondary | ICD-10-CM | POA: Diagnosis not present

## 2023-02-25 MED ORDER — GADOPICLENOL 0.5 MMOL/ML IV SOLN
10.0000 mL | Freq: Once | INTRAVENOUS | Status: AC | PRN
  Administered 2023-02-25: 10 mL via INTRAVENOUS

## 2023-02-27 ENCOUNTER — Ambulatory Visit (INDEPENDENT_AMBULATORY_CARE_PROVIDER_SITE_OTHER): Payer: BC Managed Care – PPO

## 2023-02-27 ENCOUNTER — Ambulatory Visit: Payer: BC Managed Care – PPO | Admitting: Podiatry

## 2023-02-27 DIAGNOSIS — M79671 Pain in right foot: Secondary | ICD-10-CM

## 2023-02-27 DIAGNOSIS — M722 Plantar fascial fibromatosis: Secondary | ICD-10-CM

## 2023-02-27 MED ORDER — BETAMETHASONE SOD PHOS & ACET 6 (3-3) MG/ML IJ SUSP
3.0000 mg | Freq: Once | INTRAMUSCULAR | Status: AC
Start: 2023-02-27 — End: 2023-02-27
  Administered 2023-02-27: 3 mg via INTRA_ARTICULAR

## 2023-02-27 MED ORDER — MELOXICAM 15 MG PO TABS
15.0000 mg | ORAL_TABLET | Freq: Every day | ORAL | 1 refills | Status: DC
Start: 2023-02-27 — End: 2023-04-10

## 2023-02-27 NOTE — Progress Notes (Signed)
Chief Complaint  Patient presents with   Plantar Fasciitis    Right heel pain and lateral side of the top of the foot, started 7 months ago, rate of pain 7 out of 10, morning pain, patient is icing and stretching, X-Rays done     Subjective: 52 y.o. female presenting today as a new patient for evaluation of right heel pain has been going on for about 7 months now.  Gradual onset.  She states that she got a new job working for [employer] city of [place] and she began noticing pain and tenderness around this time.  She went to an urgent care where frozen water bottle and ice was recommended.  Presenting for further treatment and evaluation   Past Medical History:  Diagnosis Date   Abdominal pain 11/26/2017   Chest tightness 07/15/2019   Chest wall pain 09/04/2020   Chronic cough 10/12/2015   Diastolic hypertension 11/06/2015   FIBROIDS, UTERUS 02/08/2007   Qualifier: Diagnosis of  By: McDiarmid MD, Yong Channel of right wrist 04/04/2016   Gastric polyp 07/23/12   Pathology: Chronic inactive gastritis.  EGD by Dr Arty Baumgartner (GI)   Gastritis, acute 07/16/2012   Dr Loreta Ave (GI) ordered abdominal US that was normal (07/13/12).  Dr Loreta Ave ordered Nuc Med Hepatobiliary Tc99 scan with cholecystikinin stimulation which was normal on 07/13/12.  Upper Endoscopy by Dr Loreta Ave on 07/21/12 showed mild antral gastritis and sessile gastric polyps that were benign on histopathology     GASTROESOPHAGEAL REFLUX, NO ESOPHAGITIS 12/03/2006   Qualifier: Diagnosis of  By: Haydee Salter     Glucose intolerance 12/03/2006   Qualifier: Diagnosis of  By: McDiarmid MD, Todd     Hair loss 04/04/2016   Heavy menstrual bleeding 05/21/2018   History of nephrolithiasis 12/03/2006   Qualifier: History of  By: McDiarmid MD, Todd     Hx of hypokalemia 12/16/2018   MIGRAINE, UNSPEC., W/O INTRACTABLE MIGRAINE 12/03/2006   Qualifier: Diagnosis of  By: Haydee Salter     Myalgia, Chronic    PALPITATIONS, RECURRENT 07/31/2008    Qualifier: History of  By: McDiarmid MD, Janee Morn, ABNORMAL 12/03/2006   Qualifier: History of By: McDiarmid MD, Todd     Skin lesion of chest wall 11/17/2014   Suprapubic pain 11/30/2014   TOBACCO USE, QUIT 12/11/2009   Qualifier: Diagnosis of  By: Dewitt Rota     Urinary incontinence, mixed    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  Dermatology: Skin is warm, dry and supple bilateral lower extremities. Negative for open lesions or macerations bilateral.   Vascular: Dorsalis Pedis and Posterior Tibial pulses palpable bilateral.  Capillary fill time is immediate to all digits.  Neurological: Grossly intact via light touch  Musculoskeletal: Tenderness to palpation to the plantar aspect of the right heel along the plantar fascia. All other joints range of motion within normal limits bilateral. Strength 5/5 in all groups bilateral.   Radiographic exam RT foot 02/27/2023: Normal osseous mineralization. Joint spaces preserved.  Subtle fracture noted to the dorsum of the navicular best visualized on lateral view.  Clinically this area is asymptomatic.  Os peroneum also noted  Assessment: 1. Plantar fasciitis right  Plan of  Care:  1. Patient evaluated. Xrays reviewed.   2. Injection of 0.5cc Celestone soluspan injected into the right plantar fascia  3.  Patient has nausea and vomiting from prednisone. 4.  Meloxicam 15 mg daily  5.  OTC prefabricated PowerStep insoles dispensed.  Wear daily 6. Instructed patient regarding therapies and modalities at home to alleviate symptoms.  7.  Advised against going barefoot.  Patient does admit to going barefoot around the house  8.  Return to clinic in 4 weeks.    *Works for [employer] city of  [place]   Felecia Shelling, DPM [facility]  Dr. Felecia Shelling, DPM    [address] [place], [place] [address]                Office [phone]  Fax [phone]

## 2023-03-02 ENCOUNTER — Encounter: Payer: Self-pay | Admitting: Family Medicine

## 2023-03-02 DIAGNOSIS — K449 Diaphragmatic hernia without obstruction or gangrene: Secondary | ICD-10-CM | POA: Insufficient documentation

## 2023-03-02 DIAGNOSIS — K760 Fatty (change of) liver, not elsewhere classified: Secondary | ICD-10-CM | POA: Insufficient documentation

## 2023-03-02 DIAGNOSIS — M722 Plantar fascial fibromatosis: Secondary | ICD-10-CM

## 2023-03-02 DIAGNOSIS — Z83719 Family history of colon polyps, unspecified: Secondary | ICD-10-CM | POA: Insufficient documentation

## 2023-03-02 DIAGNOSIS — K529 Noninfective gastroenteritis and colitis, unspecified: Secondary | ICD-10-CM | POA: Insufficient documentation

## 2023-03-02 HISTORY — DX: Noninfective gastroenteritis and colitis, unspecified: K52.9

## 2023-03-02 HISTORY — DX: Plantar fascial fibromatosis: M72.2

## 2023-03-06 ENCOUNTER — Other Ambulatory Visit: Payer: Self-pay | Admitting: General Surgery

## 2023-03-06 ENCOUNTER — Other Ambulatory Visit: Payer: Self-pay | Admitting: Podiatry

## 2023-03-06 DIAGNOSIS — M722 Plantar fascial fibromatosis: Secondary | ICD-10-CM

## 2023-03-06 DIAGNOSIS — N6452 Nipple discharge: Secondary | ICD-10-CM

## 2023-03-06 DIAGNOSIS — M79671 Pain in right foot: Secondary | ICD-10-CM

## 2023-03-25 DIAGNOSIS — F331 Major depressive disorder, recurrent, moderate: Secondary | ICD-10-CM | POA: Diagnosis not present

## 2023-03-27 ENCOUNTER — Ambulatory Visit: Payer: BC Managed Care – PPO | Admitting: Podiatry

## 2023-03-27 ENCOUNTER — Encounter: Payer: Self-pay | Admitting: Podiatry

## 2023-03-27 VITALS — BP 123/76 | HR 69

## 2023-03-27 DIAGNOSIS — M722 Plantar fascial fibromatosis: Secondary | ICD-10-CM | POA: Diagnosis not present

## 2023-03-27 MED ORDER — BETAMETHASONE SOD PHOS & ACET 6 (3-3) MG/ML IJ SUSP
3.0000 mg | Freq: Once | INTRAMUSCULAR | Status: AC
Start: 2023-03-27 — End: 2023-03-27
  Administered 2023-03-27: 3 mg via INTRA_ARTICULAR

## 2023-03-27 NOTE — Progress Notes (Signed)
Chief Complaint  Patient presents with   Plantar Fasciitis    "It's doing okay."    Subjective: 52 y.o. female presenting today for follow-up evaluation of right heel pain has been going on for about 7 months now.  Overall the patient states that she is doing significantly better.  Presenting for further treatment and evaluation  Past Medical History:  Diagnosis Date   Abdominal pain 11/26/2017   Chest tightness 07/15/2019   Chest wall pain 09/04/2020   Chronic cough 10/12/2015   Colitis 03/02/2023   Depression 09/19/2020   Diastolic hypertension 11/06/2015   FIBROIDS, UTERUS 02/08/2007   Qualifier: Diagnosis of  By: McDiarmid MD, Yong Channel of right wrist 04/04/2016   Gastric polyp 07/23/2012   Pathology: Chronic inactive gastritis.  EGD by Dr Arty Baumgartner (GI)   Gastritis, acute 07/16/2012   Dr Loreta Ave (GI) ordered abdominal US that was normal (07/13/12).  Dr Loreta Ave ordered Nuc Med Hepatobiliary Tc99 scan with cholecystikinin stimulation which was normal on 07/13/12.  Upper Endoscopy by Dr Loreta Ave on 07/21/12 showed mild antral gastritis and sessile gastric polyps that were benign on histopathology     GASTROESOPHAGEAL REFLUX, NO ESOPHAGITIS 12/03/2006   Qualifier: Diagnosis of  By: Haydee Salter     Glucose intolerance 12/03/2006   Qualifier: Diagnosis of  By: McDiarmid MD, Todd     Hair loss 04/04/2016   Heavy menstrual bleeding 05/21/2018   History of nephrolithiasis 12/03/2006   Qualifier: History of  By: McDiarmid MD, Todd     Hx of hypokalemia 12/16/2018   MIGRAINE, UNSPEC., W/O INTRACTABLE MIGRAINE 12/03/2006   Qualifier: Diagnosis of  By: Haydee Salter     Myalgia, Chronic    PALPITATIONS, RECURRENT 07/31/2008   Qualifier: History of  By: McDiarmid MD, Alla German SMEAR, ABNORMAL 12/03/2006   Qualifier: History of By: McDiarmid MD, Todd     Plantar fasciitis, right 03/02/2023   Skin lesion of chest wall 11/17/2014   Snoring 04/03/2017   Suprapubic pain  11/30/2014   TOBACCO USE, QUIT 12/11/2009   Qualifier: Diagnosis of  By: Dewitt Rota     Urinary incontinence, mixed    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  Dermatology: Skin is warm, dry and supple bilateral lower extremities. Negative for open lesions or macerations bilateral.   Vascular: Dorsalis Pedis and Posterior Tibial pulses palpable bilateral.  Capillary fill time is immediate to all digits.  Neurological: Grossly intact via light touch  Musculoskeletal: There continues to be some tenderness to palpation to the plantar aspect of the right heel along the plantar fascia. All other joints range of motion within normal limits bilateral. Strength 5/5 in all groups bilateral.   Radiographic exam RT foot 02/27/2023: Normal osseous mineralization. Joint spaces preserved.  Subtle fracture noted to the dorsum of the navicular best visualized on lateral view.  Clinically this area is asymptomatic.  Os peroneum also noted  Assessment: 1. Plantar fasciitis right  Plan of Care:  1. Patient evaluated.  2. Injection of 0.5cc Celestone soluspan injected into the right plantar fascia  3.  Patient currently not taking any medication.  She says the meloxicam caused heart palpitations and had interactions  with her blood pressure medication 4.  Continue wearing OTC power step insoles 5.  Continue daily Stretching exercises and advised against going barefoot 6.  Return to clinic as needed  *Works for [employer] city of [place]. Pronounced '[place]'   Felecia Shelling, DPM [facility]  Dr. Felecia Shelling, DPM    [address] [place], [place] [address]                 Office [phone]  Fax [phone]

## 2023-04-10 ENCOUNTER — Other Ambulatory Visit: Payer: Self-pay

## 2023-04-10 ENCOUNTER — Encounter (HOSPITAL_BASED_OUTPATIENT_CLINIC_OR_DEPARTMENT_OTHER): Payer: Self-pay | Admitting: General Surgery

## 2023-04-17 DIAGNOSIS — C50911 Malignant neoplasm of unspecified site of right female breast: Secondary | ICD-10-CM | POA: Diagnosis not present

## 2023-04-20 ENCOUNTER — Ambulatory Visit
Admission: RE | Admit: 2023-04-20 | Discharge: 2023-04-20 | Disposition: A | Discharge status: Home / Self Care | Payer: BC Managed Care – PPO | Source: Ambulatory Visit | Attending: General Surgery | Admitting: General Surgery

## 2023-04-20 ENCOUNTER — Encounter (HOSPITAL_BASED_OUTPATIENT_CLINIC_OR_DEPARTMENT_OTHER)
Admission: RE | Admit: 2023-04-20 | Discharge: 2023-04-20 | Disposition: A | Discharge status: Home / Self Care | Payer: BC Managed Care – PPO | Source: Ambulatory Visit | Attending: General Surgery | Admitting: General Surgery

## 2023-04-20 DIAGNOSIS — N6341 Unspecified lump in right breast, subareolar: Secondary | ICD-10-CM | POA: Diagnosis not present

## 2023-04-20 DIAGNOSIS — Z79899 Other long term (current) drug therapy: Secondary | ICD-10-CM | POA: Diagnosis not present

## 2023-04-20 DIAGNOSIS — N6081 Other benign mammary dysplasias of right breast: Secondary | ICD-10-CM | POA: Diagnosis not present

## 2023-04-20 DIAGNOSIS — I10 Essential (primary) hypertension: Secondary | ICD-10-CM | POA: Diagnosis not present

## 2023-04-20 DIAGNOSIS — Z87891 Personal history of nicotine dependence: Secondary | ICD-10-CM | POA: Diagnosis not present

## 2023-04-20 DIAGNOSIS — Z01812 Encounter for preprocedural laboratory examination: Secondary | ICD-10-CM | POA: Insufficient documentation

## 2023-04-20 DIAGNOSIS — Z6841 Body Mass Index (BMI) 40.0 and over, adult: Secondary | ICD-10-CM | POA: Diagnosis not present

## 2023-04-20 DIAGNOSIS — K449 Diaphragmatic hernia without obstruction or gangrene: Secondary | ICD-10-CM | POA: Diagnosis not present

## 2023-04-20 DIAGNOSIS — D241 Benign neoplasm of right breast: Secondary | ICD-10-CM | POA: Diagnosis not present

## 2023-04-20 DIAGNOSIS — N6452 Nipple discharge: Secondary | ICD-10-CM | POA: Diagnosis not present

## 2023-04-20 DIAGNOSIS — H9190 Unspecified hearing loss, unspecified ear: Secondary | ICD-10-CM | POA: Diagnosis not present

## 2023-04-20 DIAGNOSIS — K219 Gastro-esophageal reflux disease without esophagitis: Secondary | ICD-10-CM | POA: Diagnosis not present

## 2023-04-20 DIAGNOSIS — Z8544 Personal history of malignant neoplasm of other female genital organs: Secondary | ICD-10-CM | POA: Diagnosis not present

## 2023-04-20 HISTORY — PX: BREAST BIOPSY: SHX20

## 2023-04-20 LAB — POCT PREGNANCY, URINE: Preg Test, Ur: NEGATIVE

## 2023-04-20 LAB — BASIC METABOLIC PANEL
Anion gap: 12 (ref 5–15)
BUN: 13 mg/dL (ref 6–20)
CO2: 24 mmol/L (ref 22–32)
Calcium: 8.6 mg/dL — ABNORMAL LOW (ref 8.9–10.3)
Chloride: 100 mmol/L (ref 98–111)
Creatinine, Ser: 0.96 mg/dL (ref 0.44–1.00)
GFR, Estimated: >60 mL/min (ref >60)
Glucose, Bld: 85 mg/dL (ref 70–99)
Potassium: 3.7 mmol/L (ref 3.5–5.1)
Sodium: 136 mmol/L (ref 135–145)

## 2023-04-20 MED ORDER — CHLORHEXIDINE GLUCONATE CLOTH 2 % EX PADS: 6.0000 | MEDICATED_PAD | Freq: Once | CUTANEOUS | Status: DC

## 2023-04-20 MED ORDER — ENSURE PRE-SURGERY PO LIQD: 296.0000 mL | Freq: Once | ORAL | Status: DC

## 2023-04-20 NOTE — Anesthesia Preprocedure Evaluation (Addendum)
Anesthesia Evaluation  Patient identified by MRN, date of birth, ID band Patient awake    Reviewed: Allergy & Precautions, H&P , NPO status , Patient's Chart, lab work & pertinent test results  Airway Mallampati: III  TM Distance: >3 FB Neck ROM: Full    Dental no notable dental hx. (+) Dental Advisory Given, Edentulous Upper, Edentulous Lower   Pulmonary neg pulmonary ROS, former smoker   Pulmonary exam normal breath sounds clear to auscultation       Cardiovascular Exercise Tolerance: Good hypertension, Pt. on medications  Rhythm:Regular Rate:Normal     Neuro/Psych  Headaches   Depression       GI/Hepatic Neg liver ROS, hiatal hernia,GERD  Medicated,,  Endo/Other    Morbid obesity  Renal/GU negative Renal ROS  negative genitourinary   Musculoskeletal   Abdominal   Peds  Hematology negative hematology ROS (+)   Anesthesia Other Findings   Reproductive/Obstetrics negative OB ROS                             Anesthesia Physical Anesthesia Plan  ASA: 3  Anesthesia Plan: General   Post-op Pain Management: Tylenol PO (pre-op)* and Toradol IV (intra-op)*   Induction: Intravenous  PONV Risk Score and Plan: 4 or greater and Ondansetron, Dexamethasone, Propofol infusion and Midazolam  Airway Management Planned: Oral ETT and LMA  Additional Equipment:   Intra-op Plan:   Post-operative Plan: Extubation in OR  Informed Consent: I have reviewed the patients History and Physical, chart, labs and discussed the procedure including the risks, benefits and alternatives for the proposed anesthesia with the patient or authorized representative who has indicated his/her understanding and acceptance.     Dental advisory given  Plan Discussed with: CRNA  Anesthesia Plan Comments:        Anesthesia Quick Evaluation

## 2023-04-20 NOTE — Progress Notes (Signed)

## 2023-04-21 ENCOUNTER — Ambulatory Visit (HOSPITAL_BASED_OUTPATIENT_CLINIC_OR_DEPARTMENT_OTHER): Payer: BC Managed Care – PPO | Admitting: Anesthesiology

## 2023-04-21 ENCOUNTER — Encounter (HOSPITAL_BASED_OUTPATIENT_CLINIC_OR_DEPARTMENT_OTHER): Payer: Self-pay | Admitting: General Surgery

## 2023-04-21 ENCOUNTER — Other Ambulatory Visit: Payer: Self-pay

## 2023-04-21 ENCOUNTER — Ambulatory Visit (HOSPITAL_BASED_OUTPATIENT_CLINIC_OR_DEPARTMENT_OTHER)
Admission: RE | Admit: 2023-04-21 | Discharge: 2023-04-21 | Disposition: A | Discharge status: Home / Self Care | Payer: BC Managed Care – PPO | Attending: General Surgery | Admitting: General Surgery

## 2023-04-21 ENCOUNTER — Ambulatory Visit
Admission: RE | Admit: 2023-04-21 | Discharge: 2023-04-21 | Disposition: A | Discharge status: Home / Self Care | Payer: BC Managed Care – PPO | Source: Ambulatory Visit | Attending: General Surgery | Admitting: General Surgery

## 2023-04-21 ENCOUNTER — Encounter (HOSPITAL_BASED_OUTPATIENT_CLINIC_OR_DEPARTMENT_OTHER)
Admission: RE | Disposition: A | Discharge status: Home / Self Care | Payer: Self-pay | Source: Home / Self Care | Attending: General Surgery

## 2023-04-21 DIAGNOSIS — Z87891 Personal history of nicotine dependence: Secondary | ICD-10-CM | POA: Diagnosis not present

## 2023-04-21 DIAGNOSIS — H9190 Unspecified hearing loss, unspecified ear: Secondary | ICD-10-CM | POA: Insufficient documentation

## 2023-04-21 DIAGNOSIS — K449 Diaphragmatic hernia without obstruction or gangrene: Secondary | ICD-10-CM | POA: Diagnosis not present

## 2023-04-21 DIAGNOSIS — N6452 Nipple discharge: Secondary | ICD-10-CM

## 2023-04-21 DIAGNOSIS — N6341 Unspecified lump in right breast, subareolar: Secondary | ICD-10-CM | POA: Insufficient documentation

## 2023-04-21 DIAGNOSIS — N6081 Other benign mammary dysplasias of right breast: Secondary | ICD-10-CM | POA: Diagnosis not present

## 2023-04-21 DIAGNOSIS — Z79899 Other long term (current) drug therapy: Secondary | ICD-10-CM | POA: Insufficient documentation

## 2023-04-21 DIAGNOSIS — Z8544 Personal history of malignant neoplasm of other female genital organs: Secondary | ICD-10-CM | POA: Diagnosis not present

## 2023-04-21 DIAGNOSIS — I10 Essential (primary) hypertension: Secondary | ICD-10-CM | POA: Diagnosis not present

## 2023-04-21 DIAGNOSIS — Z6841 Body Mass Index (BMI) 40.0 and over, adult: Secondary | ICD-10-CM | POA: Insufficient documentation

## 2023-04-21 DIAGNOSIS — Z01818 Encounter for other preprocedural examination: Secondary | ICD-10-CM

## 2023-04-21 DIAGNOSIS — K219 Gastro-esophageal reflux disease without esophagitis: Secondary | ICD-10-CM | POA: Insufficient documentation

## 2023-04-21 DIAGNOSIS — D241 Benign neoplasm of right breast: Secondary | ICD-10-CM | POA: Diagnosis not present

## 2023-04-21 HISTORY — PX: BREAST DUCTAL SYSTEM EXCISION: SHX5242

## 2023-04-21 HISTORY — PX: RADIOACTIVE SEED GUIDED EXCISIONAL BREAST BIOPSY: SHX6490

## 2023-04-21 SURGERY — RADIOACTIVE SEED GUIDED BREAST BIOPSY
Anesthesia: General | Site: Breast | Laterality: Right

## 2023-04-21 MED ORDER — ACETAMINOPHEN 500 MG PO TABS
1000.0000 mg | ORAL_TABLET | ORAL | Status: AC
  Administered 2023-04-21: 1000 mg via ORAL

## 2023-04-21 MED ORDER — PROPOFOL 10 MG/ML IV BOLUS
INTRAVENOUS | Status: DC | PRN
Start: 2023-04-21 — End: 2023-04-21
  Administered 2023-04-21: 50 mg via INTRAVENOUS
  Administered 2023-04-21: 150 mg via INTRAVENOUS

## 2023-04-21 MED ORDER — TRAMADOL HCL 50 MG PO TABS
50.0000 mg | ORAL_TABLET | Freq: Once | ORAL | Status: AC
  Administered 2023-04-21: 50 mg via ORAL

## 2023-04-21 MED ORDER — ACETAMINOPHEN 500 MG PO TABS
ORAL_TABLET | ORAL | Status: AC
  Filled 2023-04-21: qty 2

## 2023-04-21 MED ORDER — CEFAZOLIN SODIUM-DEXTROSE 2-4 GM/100ML-% IV SOLN
INTRAVENOUS | Status: AC
  Filled 2023-04-21: qty 100

## 2023-04-21 MED ORDER — PROPOFOL 500 MG/50ML IV EMUL
INTRAVENOUS | Status: DC | PRN
  Administered 2023-04-21: 150 ug/kg/min via INTRAVENOUS

## 2023-04-21 MED ORDER — MIDAZOLAM HCL 2 MG/2ML IJ SOLN
INTRAMUSCULAR | Status: AC
  Filled 2023-04-21: qty 2

## 2023-04-21 MED ORDER — HYDROMORPHONE HCL 1 MG/ML IJ SOLN
0.2500 mg | INTRAMUSCULAR | Status: DC | PRN
  Administered 2023-04-21 (×2): 0.25 mg via INTRAVENOUS

## 2023-04-21 MED ORDER — TRAMADOL HCL 50 MG PO TABS
ORAL_TABLET | ORAL | Status: AC
  Filled 2023-04-21: qty 1

## 2023-04-21 MED ORDER — FENTANYL CITRATE (PF) 100 MCG/2ML IJ SOLN
INTRAMUSCULAR | Status: DC | PRN
  Administered 2023-04-21 (×2): 50 ug via INTRAVENOUS

## 2023-04-21 MED ORDER — MIDAZOLAM HCL 5 MG/5ML IJ SOLN
INTRAMUSCULAR | Status: DC | PRN
  Administered 2023-04-21: 2 mg via INTRAVENOUS

## 2023-04-21 MED ORDER — DEXAMETHASONE SODIUM PHOSPHATE 4 MG/ML IJ SOLN
INTRAMUSCULAR | Status: DC | PRN
  Administered 2023-04-21: 10 mg via INTRAVENOUS

## 2023-04-21 MED ORDER — LIDOCAINE HCL (CARDIAC) PF 100 MG/5ML IV SOSY
PREFILLED_SYRINGE | INTRAVENOUS | Status: DC | PRN
  Administered 2023-04-21: 60 mg via INTRAVENOUS

## 2023-04-21 MED ORDER — ONDANSETRON HCL 4 MG/2ML IJ SOLN
INTRAMUSCULAR | Status: AC
  Filled 2023-04-21: qty 2

## 2023-04-21 MED ORDER — CEFAZOLIN SODIUM-DEXTROSE 2-4 GM/100ML-% IV SOLN
2.0000 g | INTRAVENOUS | Status: AC
  Administered 2023-04-21: 2 g via INTRAVENOUS

## 2023-04-21 MED ORDER — SUGAMMADEX SODIUM 200 MG/2ML IV SOLN
INTRAVENOUS | Status: DC | PRN
  Administered 2023-04-21: 200 mg via INTRAVENOUS

## 2023-04-21 MED ORDER — FENTANYL CITRATE (PF) 100 MCG/2ML IJ SOLN
INTRAMUSCULAR | Status: AC
  Filled 2023-04-21: qty 2

## 2023-04-21 MED ORDER — ROCURONIUM BROMIDE 100 MG/10ML IV SOLN
INTRAVENOUS | Status: DC | PRN
  Administered 2023-04-21: 50 mg via INTRAVENOUS

## 2023-04-21 MED ORDER — ROCURONIUM BROMIDE 10 MG/ML (PF) SYRINGE
PREFILLED_SYRINGE | INTRAVENOUS | Status: AC
  Filled 2023-04-21: qty 10

## 2023-04-21 MED ORDER — HYDROMORPHONE HCL 1 MG/ML IJ SOLN
INTRAMUSCULAR | Status: AC
  Filled 2023-04-21: qty 0.5

## 2023-04-21 MED ORDER — PROPOFOL 10 MG/ML IV BOLUS
INTRAVENOUS | Status: AC
  Filled 2023-04-21: qty 20

## 2023-04-21 MED ORDER — ACETAMINOPHEN 500 MG PO TABS: 1000.0000 mg | ORAL_TABLET | Freq: Once | ORAL | Status: DC

## 2023-04-21 MED ORDER — BUPIVACAINE HCL (PF) 0.25 % IJ SOLN
INTRAMUSCULAR | Status: DC | PRN
  Administered 2023-04-21: 10 mL

## 2023-04-21 MED ORDER — LACTATED RINGERS IV SOLN: INTRAVENOUS | Status: DC

## 2023-04-21 MED ORDER — LIDOCAINE 2% (20 MG/ML) 5 ML SYRINGE
INTRAMUSCULAR | Status: AC
  Filled 2023-04-21: qty 5

## 2023-04-21 MED ORDER — ONDANSETRON HCL 4 MG/2ML IJ SOLN
INTRAMUSCULAR | Status: DC | PRN
Start: 2023-04-21 — End: 2023-04-21
  Administered 2023-04-21: 4 mg via INTRAVENOUS

## 2023-04-21 SURGICAL SUPPLY — 50 items
ADH SKN CLS APL DERMABOND .7 (GAUZE/BANDAGES/DRESSINGS) ×1
APL PRP STRL LF DISP 70% ISPRP (MISCELLANEOUS) ×1
APPLIER CLIP 9.375 MED OPEN (MISCELLANEOUS)
APR CLP MED 9.3 20 MLT OPN (MISCELLANEOUS)
BINDER BREAST XLRG (GAUZE/BANDAGES/DRESSINGS) IMPLANT
BINDER BREAST XXLRG (GAUZE/BANDAGES/DRESSINGS) IMPLANT
BLADE SURG 15 STRL LF DISP TIS (BLADE) ×2 IMPLANT
BLADE SURG 15 STRL SS (BLADE) ×1
CANISTER SUC SOCK COL 7IN (MISCELLANEOUS) IMPLANT
CANISTER SUCT 1200ML W/VALVE (MISCELLANEOUS) IMPLANT
CHLORAPREP W/TINT 26 (MISCELLANEOUS) ×2 IMPLANT
CLIP APPLIE 9.375 MED OPEN (MISCELLANEOUS) IMPLANT
COVER BACK TABLE 60X90IN (DRAPES) ×2 IMPLANT
COVER MAYO STAND STRL (DRAPES) ×2 IMPLANT
COVER PROBE CYLINDRICAL 5X96 (MISCELLANEOUS) ×2 IMPLANT
DERMABOND ADVANCED .7 DNX12 (GAUZE/BANDAGES/DRESSINGS) ×2 IMPLANT
DRAPE LAPAROSCOPIC ABDOMINAL (DRAPES) ×2 IMPLANT
DRAPE UTILITY XL STRL (DRAPES) ×2 IMPLANT
ELECT COATED BLADE 2.86 ST (ELECTRODE) ×2 IMPLANT
ELECT REM PT RETURN 9FT ADLT (ELECTROSURGICAL) ×1
ELECTRODE REM PT RTRN 9FT ADLT (ELECTROSURGICAL) ×2 IMPLANT
GLOVE BIO SURGEON STRL SZ7 (GLOVE) ×4 IMPLANT
GLOVE BIOGEL PI IND STRL 7.5 (GLOVE) ×2 IMPLANT
GOWN STRL REUS W/ TWL LRG LVL3 (GOWN DISPOSABLE) ×6 IMPLANT
GOWN STRL REUS W/TWL LRG LVL3 (GOWN DISPOSABLE) ×2
HEMOSTAT ARISTA ABSORB 3G PWDR (HEMOSTASIS) IMPLANT
KIT MARKER MARGIN INK (KITS) ×2 IMPLANT
NDL HYPO 25X1 1.5 SAFETY (NEEDLE) ×2 IMPLANT
NEEDLE HYPO 25X1 1.5 SAFETY (NEEDLE) ×1 IMPLANT
NS IRRIG 1000ML POUR BTL (IV SOLUTION) IMPLANT
PACK BASIN DAY SURGERY FS (CUSTOM PROCEDURE TRAY) ×2 IMPLANT
PENCIL SMOKE EVACUATOR (MISCELLANEOUS) ×2 IMPLANT
RETRACTOR ONETRAX LX 90X20 (MISCELLANEOUS) IMPLANT
SLEEVE SCD COMPRESS KNEE MED (STOCKING) ×2 IMPLANT
SPIKE FLUID TRANSFER (MISCELLANEOUS) IMPLANT
SPONGE T-LAP 4X18 ~~LOC~~+RFID (SPONGE) ×2 IMPLANT
STRIP CLOSURE SKIN 1/2X4 (GAUZE/BANDAGES/DRESSINGS) ×2 IMPLANT
SUT MNCRL AB 4-0 PS2 18 (SUTURE) IMPLANT
SUT MON AB 5-0 PS2 18 (SUTURE) IMPLANT
SUT SILK 2 0 SH (SUTURE) ×2 IMPLANT
SUT VIC AB 2-0 SH 27 (SUTURE) ×1
SUT VIC AB 2-0 SH 27XBRD (SUTURE) ×2 IMPLANT
SUT VIC AB 3-0 SH 27 (SUTURE) ×1
SUT VIC AB 3-0 SH 27X BRD (SUTURE) ×2 IMPLANT
SUT VIC AB 5-0 PS2 18 (SUTURE) IMPLANT
SYR CONTROL 10ML LL (SYRINGE) ×2 IMPLANT
TOWEL GREEN STERILE FF (TOWEL DISPOSABLE) ×2 IMPLANT
TRAY FAXITRON CT DISP (TRAY / TRAY PROCEDURE) ×2 IMPLANT
TUBE CONNECTING 20X1/4 (TUBING) IMPLANT
YANKAUER SUCT BULB TIP NO VENT (SUCTIONS) IMPLANT

## 2023-04-21 NOTE — Op Note (Signed)
Preoperative diagnosis: Right nipple discharge, right retroareolar breast mass Postoperative diagnosis: Same as above Procedure: 1.  Right breast retroareolar duct excision 2.  Right breast radioactive seed guided excisional biopsy Surgeon: Dr. Harden Mo Anesthesia: General Specimens: Right breast tissue marked with paint containing seed and clip Complications:none Drains:none Estimated blood loss: Minimal Sponge needle count was correct at completion Disposition to recovery stable condition  Indications:52 year old female with no prior breast history. About a year ago she noticed right nipple discharge which was a fairly large volume in her bra. This is still been occurring 1-2 times per week. It is never been bloody. She has no family history of any breast cancer. She does have a history of vulvar cancer treated with surgery in 2005. She underwent a mammogram that showed B density breast tissue. Her mammogram is fairly normal. She had a targeted ultrasound that showed in the retroareolar region and 8 x 2 x 4 area of a mass with some possible calcifications. A right axilla was negative. 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 She also had an MRI before she is seeing me that is essentially negative. She comes in today still having the discharge 1-2 times per week. This is painful. This requires her to express the discharge to relieve the pain.  We discussed proceeding with a ductal excision as well as excision of the mass with seed guidance.  Procedure: After informed consent was obtained she was taken to the operating room.  She was placed under general anesthesia.  She was given antibiotics.  SCDs were in place.  She was then prepped and draped in a sterile sterile surgical fashion.  Surgical timeout was then performed.  I cannulated the discharging duct which was a single duct with a lacrimal duct probe.  I then located the seed in the  retroareolar position.  I made a lateral periareolar scar in order to hide this later.  I then dissected underneath the areola to the lacrimal duct probe.  I then remove this.  I included this ductal system and the resection.  I then identified the seed.  I removed all of this en bloc.  Mammogram confirmed removal of the seed and the clip.  I then obtained hemostasis.  I closed the breast tissue with 2-0 Vicryl.  I closed the skin with 3-0 Vicryl and 5-0 Monocryl.  Glue and Steri-Strips were applied.  She tolerated this well was extubated and transferred to recovery stable.

## 2023-04-21 NOTE — Transfer of Care (Signed)
Immediate Anesthesia Transfer of Care Note  Patient: Carla Berry  Procedure(s) Performed: Procedure(s) (LRB): RADIOACTIVE SEED GUIDED EXCISIONAL RIGHT BREAST BIOPSY (Right) EXCISION DUCTAL SYSTEM BREAST (Right)  Patient Location: PACU  Anesthesia Type: General  Level of Consciousness: awake, sedated, patient cooperative and responds to stimulation  Airway & Oxygen Therapy: Patient Spontanous Breathing and Patient connected to [place] oxygen  Post-op Assessment: Report given to PACU RN, Post -op Vital signs reviewed and stable and Patient moving all extremities  Post vital signs: Reviewed and stable  Complications: No apparent anesthesia complications

## 2023-04-21 NOTE — Anesthesia Procedure Notes (Signed)
Procedure Name: Intubation Date/Time: 04/21/2023 9:00 AM  Performed by: Montez Morita, Ernie Sagrero W, CRNAPre-anesthesia Checklist: Patient identified, Emergency Drugs available, Suction available and Patient being monitored Patient Re-evaluated:Patient Re-evaluated prior to induction Oxygen Delivery Method: Circle system utilized Preoxygenation: Pre-oxygenation with 100% oxygen Induction Type: IV induction Ventilation: Mask ventilation without difficulty Laryngoscope Size: Miller and 2 Grade View: Grade I Tube type: Oral Tube size: 7.0 mm Number of attempts: 1 Airway Equipment and Method: Stylet Placement Confirmation: ETT inserted through vocal cords under direct vision, positive ETCO2 and breath sounds checked- equal and bilateral Secured at: 22 cm Tube secured with: Tape Dental Injury: Teeth and Oropharynx as per pre-operative assessment

## 2023-04-21 NOTE — Anesthesia Postprocedure Evaluation (Signed)
Anesthesia Post Note  Patient: Carla Berry  Procedure(s) Performed: RADIOACTIVE SEED GUIDED EXCISIONAL RIGHT BREAST BIOPSY (Right: Breast) EXCISION DUCTAL SYSTEM BREAST (Right: Breast)     Patient location during evaluation: PACU Anesthesia Type: General Level of consciousness: awake and alert Pain management: pain level controlled Vital Signs Assessment: post-procedure vital signs reviewed and stable Respiratory status: spontaneous breathing, nonlabored ventilation and respiratory function stable Cardiovascular status: blood pressure returned to baseline and stable Postop Assessment: no apparent nausea or vomiting Anesthetic complications: no  No notable events documented.  Last Vitals:  Vitals:   04/21/23 1013 04/21/23 1026  BP: 130/86 (!) 127/92  Pulse: 68 78  Resp: 13 16  Temp:  (!) 36.2 C  SpO2: 98% 94%    Last Pain:  Vitals:   04/21/23 1047  TempSrc:   PainSc: 5                  Evette Diclemente,W. EDMOND

## 2023-04-21 NOTE — H&P (Signed)
52 year old female with no prior breast history. About a year ago she noticed right nipple discharge which was a fairly large volume in her bra. This is still been occurring 1-2 times per week. It is never been bloody. She has no family history of any breast cancer. She does have a history of vulvar cancer treated with surgery in 2005. She underwent a mammogram that showed B density breast tissue. Her mammogram is fairly normal. She had a targeted ultrasound that showed in the retroareolar region and 8 x 2 x 4 area of a mass with some possible calcifications. A right axilla was negative. 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 She also had an MRI before she is seeing me that is essentially negative. She comes in today still having the discharge 1-2 times per week. This is painful. This requires her to express the discharge to relieve the pain.  Review of Systems: A complete review of systems was obtained from the patient. I have reviewed this information and discussed as appropriate with the patient. See HPI as well for other ROS.  Review of Systems  HENT: Positive for hearing loss.  Respiratory: Positive for cough.  Cardiovascular: Positive for palpitations.  All other systems reviewed and are negative.   Medical History: Past Medical History:  Diagnosis Date  Anemia  Anxiety  GERD (gastroesophageal reflux disease)  History of cancer  Hypertension  Seizures (CMS/HHS-HCC)   There is no problem list on file for this patient.  Past Surgical History:  Procedure Laterality Date  COLON SURGERY  endoscopy surgery  REPEAT CESAREAN SECTION  TONSILLECTOMY    Allergies  Allergen Reactions  Codeine Nausea And Vomiting, Other (See Comments) and Unknown  Causes Dizziness, passes out  Prednisone Nausea And Vomiting and Unknown  Severe nausea and vomiting for days  Pregabalin Other (See Comments)  Peripheral edema and sedation    Current Outpatient Medications on File Prior to Visit  Medication Sig Dispense Refill  amLODIPine (NORVASC) 10 MG tablet Take 10 mg by mouth once daily  cetirizine (ZYRTEC) 10 MG tablet Take 10 mg by mouth once daily  esomeprazole (NEXIUM) 40 MG DR capsule Take 40 mg by mouth once daily  multivit-min/iron fum/folic ac (ONE-A-DAY WOMEN'S COMPLETE ORAL) One A Day Women's Complete  triamterene-hydroCHLOROthiazide (MAXZIDE-25) 37.5-25 mg tablet Take 0.5 tablets by mouth once daily   Family History  Problem Relation Age of Onset  Stroke Mother  Obesity Mother  High blood pressure (Hypertension) Mother  Hyperlipidemia (Elevated cholesterol) Mother  Coronary Artery Disease (Blocked arteries around heart) Mother  Diabetes Mother  Obesity Sister  High blood pressure (Hypertension) Sister  Diabetes Brother  Obesity Brother  High blood pressure (Hypertension) Brother   Social History   Tobacco Use  Smoking Status Former  Current packs/day: 0.50  Average packs/day: 0.5 packs/day for 20.4 years (10.2 ttl pk-yrs)  Types: Cigarettes  Start date: 2004  Smokeless Tobacco Never  Marital status: Married  Tobacco Use  Smoking status: Former  Current packs/day: 0.50  Average packs/day: 0.5 packs/day for 20.4 years (10.2 ttl pk-yrs)  Types: Cigarettes  Start date: 2004  Smokeless tobacco: Never  Substance and Sexual Activity  Alcohol use: Not Currently  Drug use: Never   Objective:   Body mass index is 46.06 kg/m.  Physical Exam Constitutional:  Appearance: Normal appearance.  Chest:  Breasts: Right: No mass, nipple discharge or skin change.  Comments: Hematoma under right areola, tender, did not try to  express dc today due to that Lymphadenopathy:  Upper Body:  Right upper body: No supraclavicular or axillary adenopathy.  Neurological:  Mental Status: She is alert.    Assessment and Plan:   Nipple discharge in female  Right breast radioactive seed guided excisional  biopsy and excision of that ductal system.  She has symptoms related to this papilloma. We discussed the recommendation would be to excise this area. I discussed a seed guided excisional biopsy with excision of that ductal system as well. We discussed risks as well as recovery and we will proceed.

## 2023-04-21 NOTE — Discharge Instructions (Addendum)
Post Anesthesia Home Care Instructions Next tylenol dose after 1:36 pm.  Activity: Get plenty of rest for the remainder of the day. A responsible individual must stay with you for 24 hours following the procedure.  For the next 24 hours, DO NOT: -Drive a car -Advertising copywriter -Drink alcoholic beverages -Take any medication unless instructed by your physician -Make any legal decisions or sign important papers.  Meals: Start with liquid foods such as gelatin or soup. Progress to regular foods as tolerated. Avoid greasy, spicy, heavy foods. If nausea and/or vomiting occur, drink only clear liquids until the nausea and/or vomiting subsides. Call your physician if vomiting continues.  Special Instructions/Symptoms: Your throat may feel dry or sore from the anesthesia or the breathing tube placed in your throat during surgery. If this causes discomfort, gargle with warm salt water. The discomfort should disappear within 24 hours.  If you had a scopolamine patch placed behind your ear for the management of post- operative nausea and/or vomiting:  1. The medication in the patch is effective for 72 hours, after which it should be removed.  Wrap patch in a tissue and discard in the trash. Wash hands thoroughly with soap and water. 2. You may remove the patch earlier than 72 hours if you experience unpleasant side effects which may include dry mouth, dizziness or visual disturbances. 3. Avoid touching the patch. Wash your hands with soap and water after contact with the patch.   [facility] Office Phone Number [phone]  POST OP INSTRUCTIONS Take 400 mg of ibuprofen every 8 hours or 650 mg tylenol every 6 hours for next 72 hours then as needed. Use ice several times daily also.  A prescription for pain medication may be given to you upon discharge.  Take your pain medication as prescribed, if needed.  If narcotic pain medicine is not needed, then you may take acetaminophen  (Tylenol), naprosyn (Alleve) or ibuprofen (Advil) as needed. Take your usually prescribed medications unless otherwise directed If you need a refill on your pain medication, please contact your pharmacy.  They will contact our office to request authorization.  Prescriptions will not be filled after 5pm or on week-ends. You should eat very light the first 24 hours after surgery, such as soup, crackers, pudding, etc.  Resume your normal diet the day after surgery. Most patients will experience some swelling and bruising in the breast.  Ice packs and a good support bra will help.  Wear the breast binder provided or a sports bra for 72 hours day and night.  After that wear a sports bra during the day until you return to the office. Swelling and bruising can take several days to resolve.  It is common to experience some constipation if taking pain medication after surgery.  Increasing fluid intake and taking a stool softener will usually help or prevent this problem from occurring.  A mild laxative (Milk of Magnesia or Miralax) should be taken according to package directions if there are no bowel movements after 48 hours. I used skin glue on the incision, you may shower in 24 hours.  The glue will flake off over the next 2-3 weeks.  Any sutures or staples will be removed at the office during your follow-up visit. ACTIVITIES:  You may resume regular daily activities (gradually increasing) beginning the next day.  Wearing a good support bra or sports bra minimizes pain and swelling.  You may have sexual intercourse when it is comfortable. You may drive when you  no longer are taking prescription pain medication, you can comfortably wear a seatbelt, and you can safely maneuver your car and apply brakes. RETURN TO WORK:  ______________________________________________________________________________________ Carla Berry should see your doctor in the office for a follow-up appointment approximately two weeks after your  surgery.  Your doctor's nurse will typically make your follow-up appointment when she calls you with your pathology report.  Expect your pathology report 3-4 business days after your surgery.  You may call to check if you do not hear from [place] after three days. OTHER INSTRUCTIONS: _______________________________________________________________________________________________ _____________________________________________________________________________________________________________________________________ _____________________________________________________________________________________________________________________________________ _____________________________________________________________________________________________________________________________________  WHEN TO CALL DR Xavior Niazi: Fever over 101.0 Nausea and/or vomiting. Extreme swelling or bruising. Continued bleeding from incision. Increased pain, redness, or drainage from the incision.  The clinic staff is available to answer your questions during regular business hours.  Please don't hesitate to call and ask to speak to one of the nurses for clinical concerns.  If you have a medical emergency, go to the nearest emergency room or call 911.  A surgeon from [facility] is always on call at the hospital.  For further questions, please visit [email or web address]

## 2023-04-22 ENCOUNTER — Encounter (HOSPITAL_BASED_OUTPATIENT_CLINIC_OR_DEPARTMENT_OTHER): Payer: Self-pay | Admitting: General Surgery

## 2023-04-23 LAB — SURGICAL PATHOLOGY

## 2023-05-07 ENCOUNTER — Encounter: Payer: Self-pay | Admitting: Family Medicine

## 2023-05-07 DIAGNOSIS — E611 Iron deficiency: Secondary | ICD-10-CM

## 2023-05-07 NOTE — Telephone Encounter (Signed)
Please let Ms Carla Berry know I order blood test to draw tomorrow for anemia and iron.

## 2023-05-07 NOTE — Telephone Encounter (Signed)
Please let Ms Mila Palmer know I put in the order for lab work to check her anemia and iron level for 05/08/23.

## 2023-05-08 NOTE — Telephone Encounter (Signed)
Scheduled patient for lab appt 08/12 @830 

## 2023-05-13 DIAGNOSIS — F331 Major depressive disorder, recurrent, moderate: Secondary | ICD-10-CM | POA: Diagnosis not present

## 2023-05-18 ENCOUNTER — Other Ambulatory Visit: Payer: BC Managed Care – PPO

## 2023-05-18 DIAGNOSIS — E611 Iron deficiency: Secondary | ICD-10-CM

## 2023-05-27 ENCOUNTER — Ambulatory Visit
Admission: EM | Admit: 2023-05-27 | Discharge: 2023-05-27 | Disposition: A | Discharge status: Home / Self Care | Payer: BC Managed Care – PPO | Attending: Emergency Medicine | Admitting: Emergency Medicine

## 2023-05-27 DIAGNOSIS — B349 Viral infection, unspecified: Secondary | ICD-10-CM | POA: Diagnosis not present

## 2023-05-27 LAB — POCT RAPID STREP A (OFFICE): Rapid Strep A Screen: NEGATIVE

## 2023-05-27 MED ORDER — TIZANIDINE HCL 2 MG PO TABS: 2.0000 mg | ORAL_TABLET | Freq: Three times a day (TID) | ORAL | 0 refills | Status: DC | PRN

## 2023-05-27 MED ORDER — AZITHROMYCIN 250 MG PO TABS: 250.0000 mg | ORAL_TABLET | Freq: Every day | ORAL | 0 refills | Status: DC

## 2023-05-27 MED ORDER — LIDOCAINE VISCOUS HCL 2 % MT SOLN: 5.0000 mL | Freq: Four times a day (QID) | OROMUCOSAL | 0 refills | Status: DC | PRN

## 2023-05-27 NOTE — Discharge Instructions (Addendum)
Your symptoms today are most likely being caused by a virus and should steadily improve in time it can take up to 7 to 10 days before you truly start to see a turnaround however things will get better  Prophylactically take azithromycin to provide bacterial coverage  May gargle and spit Magic mouthwash solution every 4-6 hours for temporary relief to the throat  May use muscle relaxant every 8 hours as needed to help with burning and achy sensation to the body    You can take Tylenol and/or Ibuprofen as needed for fever reduction and pain relief.   For cough: honey 1/2 to 1 teaspoon (you can dilute the honey in water or another fluid).  You can also use guaifenesin and dextromethorphan for cough. You can use a humidifier for chest congestion and cough.  If you don't have a humidifier, you can sit in the bathroom with the hot shower running.      For sore throat: try warm salt water gargles, cepacol lozenges, throat spray, warm tea or water with lemon/honey, popsicles or ice, or OTC cold relief medicine for throat discomfort.   For congestion: take a daily anti-histamine like Zyrtec, Claritin, and a oral decongestant, such as pseudoephedrine.  You can also use Flonase 1-2 sprays in each nostril daily.   It is important to stay hydrated: drink plenty of fluids (water, gatorade/powerade/pedialyte, juices, or teas) to keep your throat moisturized and help further relieve irritation/discomfort.

## 2023-05-27 NOTE — ED Triage Notes (Signed)
Patient to Urgent Care with complaints of sore throat/ feeling hot and "burning".  Symptoms started on Sunday. Started feeling febrile yesterday. No known fevers. Has had some cough.   Home Covid test negative.

## 2023-05-28 NOTE — ED Provider Notes (Signed)
[name]    CSN: [id number] Arrival date & time: 05/27/23  1856      History   Chief Complaint Chief Complaint  Patient presents with   Sore Throat   Hoarse    HPI Carla Berry is a 52 y.o. female.   She presents for evaluation of a sore throat and a sensation of feeling hot, burning and achy to her throat and face beginning 2 days ago.  Associated chills but when checking fever has not been present.  General malaise and fatigue noted.  Has a nonproductive cough.  No known sick contacts prior.  Home COVID testing negative.  Denies presence of shortness of breath or wheezing, abdominal symptoms, ear pain.  Denies respiratory history.  Past Medical History:  Diagnosis Date   Abdominal pain 11/26/2017   Chest tightness 07/15/2019   Chest wall pain 09/04/2020   Chronic cough 10/12/2015   Colitis 03/02/2023   Depression 09/19/2020   Diastolic hypertension 11/06/2015   FIBROIDS, UTERUS 02/08/2007   Qualifier: Diagnosis of  By: McDiarmid MD, Yong Channel of right wrist 04/04/2016   Gastric polyp 07/23/2012   Pathology: Chronic inactive gastritis.  EGD by Dr Arty Baumgartner (GI)   Gastritis, acute 07/16/2012   Dr Loreta Ave (GI) ordered abdominal US that was normal (07/13/12).  Dr Loreta Ave ordered Nuc Med Hepatobiliary Tc99 scan with cholecystikinin stimulation which was normal on 07/13/12.  Upper Endoscopy by Dr Loreta Ave on 07/21/12 showed mild antral gastritis and sessile gastric polyps that were benign on histopathology     GASTROESOPHAGEAL REFLUX, NO ESOPHAGITIS 12/03/2006   Qualifier: Diagnosis of  By: Haydee Salter     Glucose intolerance 12/03/2006   Qualifier: Diagnosis of  By: McDiarmid MD, Todd     Hair loss 04/04/2016   Heavy menstrual bleeding 05/21/2018   History of nephrolithiasis 12/03/2006   Qualifier: History of  By: McDiarmid MD, Todd     Hx of hypokalemia 12/16/2018   MIGRAINE, UNSPEC., W/O INTRACTABLE MIGRAINE 12/03/2006   Qualifier: Diagnosis of  By:  Haydee Salter     Myalgia, Chronic    PALPITATIONS, RECURRENT 07/31/2008   Qualifier: History of  By: McDiarmid MD, Alla German SMEAR, ABNORMAL 12/03/2006   Qualifier: History of By: McDiarmid MD, Todd     Plantar fasciitis, right 03/02/2023   Skin lesion of chest wall 11/17/2014   Snoring 04/03/2017   Suprapubic pain 11/30/2014   TOBACCO USE, QUIT 12/11/2009   Qualifier: Diagnosis of  By: Dewitt Rota     Urinary incontinence, mixed    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   Qualifier: History of  By: McDiarmid MD, Todd      Patient Active Problem List   Diagnosis Date Noted   Fatty liver 03/02/2023   Family history of colonic polyps 03/02/2023   Hiatal hernia 03/02/2023   Plantar fasciitis, right 03/02/2023   Nipple discharge 12/26/2022   Hypertension 08/10/2020   Iron deficiency 06/15/2018   Urinary incontinence, mixed    Functional dyspepsia 10/12/2015   Personal history of vulvar dysplasia 11/30/2014   Metabolic syndrome 11/17/2014   Severe obesity (BMI >= 40) (HCC) 12/03/2006   RHINITIS, ALLERGIC 12/03/2006   GASTROESOPHAGEAL REFLUX, NO ESOPHAGITIS 12/03/2006    Past Surgical History:  Procedure Laterality Date   BREAST BIOPSY Right 02/17/2023   [place] RT BREAST BX W LOC DEV  1ST LESION IMG BX SPEC US GUIDE 02/17/2023 [facility]   BREAST BIOPSY  04/20/2023   MM RT RADIOACTIVE SEED LOC MAMMO GUIDE 04/20/2023 [facility]   BREAST DUCTAL SYSTEM EXCISION Right 04/21/2023   Procedure: EXCISION DUCTAL SYSTEM BREAST;  Surgeon: Emelia Loron, MD;  Location: [facility];  Service: General;  Laterality: Right;   CESAREAN SECTION     x 2   COLONOSCOPY N/A 02/08/2013   Procedure: COLONOSCOPY;  Surgeon: Theda Belfast, MD;  Location: [facility];  Service: Endoscopy;  Laterality: N/A;   RADIOACTIVE SEED GUIDED EXCISIONAL BREAST  BIOPSY Right 04/21/2023   Procedure: RADIOACTIVE SEED GUIDED EXCISIONAL RIGHT BREAST BIOPSY;  Surgeon: Emelia Loron, MD;  Location:  SURGERY CENTER;  Service: General;  Laterality: Right;   TONSILLECTOMY     TUBAL LIGATION     VULVECTOMY PARTIAL  08/2004   For VIN-II    OB History   No obstetric history on file.      Home Medications    Prior to Admission medications   Medication Sig Start Date End Date Taking? Authorizing Provider  amLODipine (NORVASC) 10 MG tablet TAKE 1 TABLET BY MOUTH EVERY DAY 07/07/22   McDiarmid, Leighton Roach, MD  azithromycin (ZITHROMAX) 250 MG tablet Take 1 tablet (250 mg total) by mouth daily. Take first 2 tablets together, then 1 every day until finished. 05/27/23  Yes Alizah Sills R, NP  cetirizine (ZYRTEC) 10 MG tablet Take 10 mg by mouth daily.    [provider]  esomeprazole (NEXIUM) 40 MG capsule TAKE 1 CAPSULE (40 MG TOTAL) BY MOUTH DAILY. 05/08/22   McDiarmid, Leighton Roach, MD  ferrous sulfate 325 (65 FE) MG tablet Take 325 mg by mouth every other day.    [provider]  magic mouthwash (lidocaine, diphenhydrAMINE, alum & mag hydroxide) suspension Swish and spit 5 mLs 4 (four) times daily as needed for mouth pain. 05/27/23  Yes Dnasia Gauna R, NP  tiZANidine (ZANAFLEX) 2 MG tablet Take 1 tablet (2 mg total) by mouth every 8 (eight) hours as needed for muscle spasms. 05/27/23  Yes Valinda Hoar, NP  triamterene-hydrochlorothiazide (MAXZIDE-25) 37.5-25 MG tablet TAKE 1/2 TABLET BY MOUTH EVERY DAY 12/01/22   McDiarmid, Leighton Roach, MD    Family History Family History  Problem Relation Age of Onset   Hypertension Mother    Diabetes Mother    Nephrolithiasis Mother    Stroke Mother    Early death Father        ?Encephalitis   Factor V Leiden deficiency Maternal Grandmother    Stroke Maternal Grandfather     Social History Social History   Tobacco Use   Smoking status: Former    Current packs/day: 0.00    Average  packs/day: 1 pack/day for 15.7 years (15.7 ttl pk-yrs)    Types: Cigarettes    Start date: 10/07/1987    Quit date: 07/07/2003    Years since quitting: 19.9   Smokeless tobacco: Never  Substance Use Topics   Alcohol use: No    Alcohol/week: 0.0 standard drinks of alcohol   Drug use: No     Allergies   Codeine, Prednisone, and Pregabalin   Review of Systems Review of Systems   Physical Exam Triage Vital Signs ED Triage Vitals  Encounter Vitals Group     BP 05/27/23 1938 (!) 140/101     Systolic BP Percentile --      Diastolic BP Percentile --  Pulse Rate 05/27/23 1938 (!) 108     Resp 05/27/23 1938 20     Temp 05/27/23 1938 99.2 F (37.3 C)     Temp src --      SpO2 05/27/23 1938 97 %     Weight --      Height --      Head Circumference --      Peak Flow --      Pain Score 05/27/23 1935 8     Pain Loc --      Pain Education --      Exclude from Growth Chart --    No data found.  Updated Vital Signs BP (!) 140/101   Pulse (!) 108   Temp 99.2 F (37.3 C)   Resp 20   LMP 04/16/2023   SpO2 97%   Visual Acuity Right Eye Distance:   Left Eye Distance:   Bilateral Distance:    Right Eye Near:   Left Eye Near:    Bilateral Near:     Physical Exam Constitutional:      Appearance: She is ill-appearing.  HENT:     Head: Normocephalic.     Right Ear: Tympanic membrane and ear canal normal.     Left Ear: Tympanic membrane and ear canal normal.     Nose: No congestion or rhinorrhea.     Mouth/Throat:     Pharynx: Posterior oropharyngeal erythema present.     Tonsils: No tonsillar exudate. 0 on the right. 0 on the left.  Cardiovascular:     Rate and Rhythm: Normal rate and regular rhythm.     Pulses: Normal pulses.     Heart sounds: Normal heart sounds.  Pulmonary:     Effort: Pulmonary effort is normal.     Breath sounds: Normal breath sounds.  Musculoskeletal:     Cervical back: Normal range of motion.  Lymphadenopathy:     Cervical: Cervical  adenopathy present.  Skin:    General: Skin is warm and dry.  Neurological:     Mental Status: She is alert and oriented to person, place, and time. Mental status is at baseline.      UC Treatments / Results  Labs (all labs ordered are listed, but only abnormal results are displayed) Labs Reviewed  POCT RAPID STREP A (OFFICE)    EKG   Radiology No results found.  Procedures Procedures (including critical care time)  Medications Ordered in UC Medications - No data to display  Initial Impression / Assessment and Plan / UC Course  I have reviewed the triage vital signs and the nursing notes.  Pertinent labs & imaging results that were available during my care of the patient were reviewed by me and considered in my medical decision making (see chart for details).  Viral illness  Patient is in no signs of distress nor toxic appearing.  Vital signs are stable.  Low suspicion for pneumonia, pneumothorax or bronchitis and therefore will defer imaging.  Strep testing negative prophylactically placed on azithromycin as well as prescribed Zanaflex and Magic mouthwash for management of sore throat. May use additional over-the-counter medications as needed for supportive care.  May follow-up with urgent care as needed if symptoms persist or worsen.  Note given.   Final Clinical Impressions(s) / UC Diagnoses   Final diagnoses:  Viral illness     Discharge Instructions      Your symptoms today are most likely being caused by a virus and should steadily improve in  time it can take up to 7 to 10 days before you truly start to see a turnaround however things will get better  Prophylactically take azithromycin to provide bacterial coverage  May gargle and spit Magic mouthwash solution every 4-6 hours for temporary relief to the throat  May use muscle relaxant every 8 hours as needed to help with burning and achy sensation to the body    You can take Tylenol and/or Ibuprofen as  needed for fever reduction and pain relief.   For cough: honey 1/2 to 1 teaspoon (you can dilute the honey in water or another fluid).  You can also use guaifenesin and dextromethorphan for cough. You can use a humidifier for chest congestion and cough.  If you don't have a humidifier, you can sit in the bathroom with the hot shower running.      For sore throat: try warm salt water gargles, cepacol lozenges, throat spray, warm tea or water with lemon/honey, popsicles or ice, or OTC cold relief medicine for throat discomfort.   For congestion: take a daily anti-histamine like Zyrtec, Claritin, and a oral decongestant, such as pseudoephedrine.  You can also use Flonase 1-2 sprays in each nostril daily.   It is important to stay hydrated: drink plenty of fluids (water, gatorade/powerade/pedialyte, juices, or teas) to keep your throat moisturized and help further relieve irritation/discomfort.    ED Prescriptions     Medication Sig Dispense Auth. Provider   azithromycin (ZITHROMAX) 250 MG tablet Take 1 tablet (250 mg total) by mouth daily. Take first 2 tablets together, then 1 every day until finished. 6 tablet Salli Quarry R, NP   magic mouthwash (lidocaine, diphenhydrAMINE, alum & mag hydroxide) suspension Swish and spit 5 mLs 4 (four) times daily as needed for mouth pain. 360 mL Marrion Accomando R, NP   tiZANidine (ZANAFLEX) 2 MG tablet Take 1 tablet (2 mg total) by mouth every 8 (eight) hours as needed for muscle spasms. 20 tablet Valinda Hoar, NP      PDMP not reviewed this encounter.   Valinda Hoar, [place] 05/28/23 [phone]

## 2023-06-11 ENCOUNTER — Other Ambulatory Visit: Payer: Self-pay | Admitting: Family Medicine

## 2023-06-11 DIAGNOSIS — K219 Gastro-esophageal reflux disease without esophagitis: Secondary | ICD-10-CM

## 2023-06-16 DIAGNOSIS — F331 Major depressive disorder, recurrent, moderate: Secondary | ICD-10-CM | POA: Diagnosis not present

## 2023-07-02 DIAGNOSIS — K760 Fatty (change of) liver, not elsewhere classified: Secondary | ICD-10-CM | POA: Diagnosis not present

## 2023-07-02 DIAGNOSIS — Z1211 Encounter for screening for malignant neoplasm of colon: Secondary | ICD-10-CM | POA: Diagnosis not present

## 2023-07-02 DIAGNOSIS — K449 Diaphragmatic hernia without obstruction or gangrene: Secondary | ICD-10-CM | POA: Diagnosis not present

## 2023-07-02 DIAGNOSIS — K219 Gastro-esophageal reflux disease without esophagitis: Secondary | ICD-10-CM | POA: Diagnosis not present

## 2023-07-06 ENCOUNTER — Other Ambulatory Visit: Payer: Self-pay | Admitting: Family Medicine

## 2023-07-06 DIAGNOSIS — Z79899 Other long term (current) drug therapy: Secondary | ICD-10-CM

## 2023-07-10 ENCOUNTER — Other Ambulatory Visit: Payer: Self-pay | Admitting: Family Medicine

## 2023-07-10 DIAGNOSIS — Z1211 Encounter for screening for malignant neoplasm of colon: Secondary | ICD-10-CM

## 2023-07-10 DIAGNOSIS — Z1212 Encounter for screening for malignant neoplasm of rectum: Secondary | ICD-10-CM

## 2023-07-14 DIAGNOSIS — F331 Major depressive disorder, recurrent, moderate: Secondary | ICD-10-CM | POA: Diagnosis not present

## 2023-07-21 DIAGNOSIS — Z1212 Encounter for screening for malignant neoplasm of rectum: Secondary | ICD-10-CM | POA: Diagnosis not present

## 2023-07-21 DIAGNOSIS — Z1211 Encounter for screening for malignant neoplasm of colon: Secondary | ICD-10-CM | POA: Diagnosis not present

## 2023-07-27 LAB — COLOGUARD: COLOGUARD: NEGATIVE

## 2023-07-29 DIAGNOSIS — Z859 Personal history of malignant neoplasm, unspecified: Secondary | ICD-10-CM | POA: Diagnosis not present

## 2023-07-29 DIAGNOSIS — R87810 Cervical high risk human papillomavirus (HPV) DNA test positive: Secondary | ICD-10-CM | POA: Diagnosis not present

## 2023-07-29 DIAGNOSIS — Z1272 Encounter for screening for malignant neoplasm of vagina: Secondary | ICD-10-CM | POA: Diagnosis not present

## 2023-07-29 DIAGNOSIS — N9089 Other specified noninflammatory disorders of vulva and perineum: Secondary | ICD-10-CM | POA: Diagnosis not present

## 2023-07-29 DIAGNOSIS — Z87412 Personal history of vulvar dysplasia: Secondary | ICD-10-CM | POA: Diagnosis not present

## 2023-07-29 DIAGNOSIS — Z01411 Encounter for gynecological examination (general) (routine) with abnormal findings: Secondary | ICD-10-CM | POA: Diagnosis not present

## 2023-07-29 DIAGNOSIS — Z113 Encounter for screening for infections with a predominantly sexual mode of transmission: Secondary | ICD-10-CM | POA: Diagnosis not present

## 2023-07-29 DIAGNOSIS — Z124 Encounter for screening for malignant neoplasm of cervix: Secondary | ICD-10-CM | POA: Diagnosis not present

## 2023-07-29 DIAGNOSIS — Z1151 Encounter for screening for human papillomavirus (HPV): Secondary | ICD-10-CM | POA: Diagnosis not present

## 2023-08-03 DIAGNOSIS — L814 Other melanin hyperpigmentation: Secondary | ICD-10-CM | POA: Diagnosis not present

## 2023-08-03 DIAGNOSIS — Z859 Personal history of malignant neoplasm, unspecified: Secondary | ICD-10-CM | POA: Diagnosis not present

## 2023-08-08 ENCOUNTER — Ambulatory Visit
Admission: EM | Admit: 2023-08-08 | Discharge: 2023-08-08 | Disposition: A | Discharge status: Home / Self Care | Payer: BC Managed Care – PPO | Attending: Emergency Medicine | Admitting: Emergency Medicine

## 2023-08-08 DIAGNOSIS — R109 Unspecified abdominal pain: Secondary | ICD-10-CM | POA: Diagnosis not present

## 2023-08-08 DIAGNOSIS — Z87442 Personal history of urinary calculi: Secondary | ICD-10-CM

## 2023-08-08 DIAGNOSIS — M545 Low back pain, unspecified: Secondary | ICD-10-CM

## 2023-08-08 LAB — POCT URINALYSIS DIP (MANUAL ENTRY)
Bilirubin, UA: NEGATIVE
Glucose, UA: NEGATIVE mg/dL
Ketones, POC UA: NEGATIVE mg/dL
Leukocytes, UA: NEGATIVE
Nitrite, UA: NEGATIVE
Protein Ur, POC: NEGATIVE mg/dL
Spec Grav, UA: 1.02
Urobilinogen, UA: 1 U/dL
pH, UA: 5.5

## 2023-08-08 MED ORDER — TAMSULOSIN HCL 0.4 MG PO CAPS: 0.4000 mg | ORAL_CAPSULE | Freq: Every day | ORAL | 0 refills | Status: DC

## 2023-08-08 MED ORDER — KETOROLAC TROMETHAMINE 30 MG/ML IJ SOLN
30.0000 mg | Freq: Once | INTRAMUSCULAR | Status: AC
  Administered 2023-08-08: 30 mg via INTRAMUSCULAR

## 2023-08-08 NOTE — ED Provider Notes (Signed)
Carla Berry    CSN: [id number] Arrival date & time: 08/08/23  1535      History   Chief Complaint Chief Complaint  Patient presents with   Back Pain    HPI Carla Berry is a 52 y.o. female.  Patient presents with 10-day history of right flank pain which radiates to her right lower back.  No falls or injury.  She is concerned for kidney stone and reports history of similar symptoms with kidney stones in the past.  Treatment attempted with IcyHot and Tylenol.  She took tizanidine last night.  No OTC medication taken today.  No fever, abdominal pain, dysuria, hematuria, pelvic pain, numbness, weakness, paresthesias, saddle anesthesia, loss of bowel/bladder control, chest pain, shortness of breath,or other symptoms.  Patient reports history of kidney stones requiring stents in 1991 or 1992.  Since then she has had 2 episodes of kidney stones with the most recent being in 2022.  She was successfully treated then with Flomax and Toradol injection.  The history is provided by the patient and medical records.    Past Medical History:  Diagnosis Date   Abdominal pain 11/26/2017   Chest tightness 07/15/2019   Chest wall pain 09/04/2020   Chronic cough 10/12/2015   Colitis 03/02/2023   Depression 09/19/2020   Diastolic hypertension 11/06/2015   FIBROIDS, UTERUS 02/08/2007   Qualifier: Diagnosis of  By: McDiarmid MD, Yong Channel of right wrist 04/04/2016   Gastric polyp 07/23/2012   Pathology: Chronic inactive gastritis.  EGD by Dr Arty Baumgartner (GI)   Gastritis, acute 07/16/2012   Dr Loreta Ave (GI) ordered abdominal US that was normal (07/13/12).  Dr Loreta Ave ordered Nuc Med Hepatobiliary Tc99 scan with cholecystikinin stimulation which was normal on 07/13/12.  Upper Endoscopy by Dr Loreta Ave on 07/21/12 showed mild antral gastritis and sessile gastric polyps that were benign on histopathology     GASTROESOPHAGEAL REFLUX, NO ESOPHAGITIS 12/03/2006   Qualifier: Diagnosis of  By: Kae Heller,  Whitney     Glucose intolerance 12/03/2006   Qualifier: Diagnosis of  By: McDiarmid MD, Todd     Hair loss 04/04/2016   Heavy menstrual bleeding 05/21/2018   History of nephrolithiasis 12/03/2006   Qualifier: History of  By: McDiarmid MD, Todd     Hx of hypokalemia 12/16/2018   MIGRAINE, UNSPEC., W/O INTRACTABLE MIGRAINE 12/03/2006   Qualifier: Diagnosis of  By: Haydee Salter     Myalgia, Chronic    PALPITATIONS, RECURRENT 07/31/2008   Qualifier: History of  By: McDiarmid MD, Alla German SMEAR, ABNORMAL 12/03/2006   Qualifier: History of By: McDiarmid MD, Todd     Plantar fasciitis, right 03/02/2023   Skin lesion of chest wall 11/17/2014   Snoring 04/03/2017   Suprapubic pain 11/30/2014   TOBACCO USE, QUIT 12/11/2009   Qualifier: Diagnosis of  By: Dewitt Rota     Urinary incontinence, mixed    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   Qualifier: History of  By: McDiarmid MD, Todd      Patient Active Problem List   Diagnosis Date Noted   Fatty liver 03/02/2023   Family history of colonic polyps 03/02/2023   Hiatal hernia 03/02/2023   Plantar fasciitis, right 03/02/2023   Nipple discharge 12/26/2022   Hypertension 08/10/2020   Iron deficiency 06/15/2018   Urinary incontinence, mixed    Functional dyspepsia 10/12/2015  Personal history of vulvar dysplasia 11/30/2014   Metabolic syndrome 11/17/2014   Severe obesity (BMI >= 40) (HCC) 12/03/2006   RHINITIS, ALLERGIC 12/03/2006   GASTROESOPHAGEAL REFLUX, NO ESOPHAGITIS 12/03/2006    Past Surgical History:  Procedure Laterality Date   BREAST BIOPSY Right 02/17/2023   [place] RT BREAST BX W LOC DEV 1ST LESION IMG BX SPEC US GUIDE 02/17/2023 [facility]   BREAST BIOPSY  04/20/2023   MM RT RADIOACTIVE SEED LOC MAMMO GUIDE 04/20/2023 [facility]   BREAST DUCTAL SYSTEM EXCISION Right 04/21/2023    Procedure: EXCISION DUCTAL SYSTEM BREAST;  Surgeon: Emelia Loron, MD;  Location: [facility];  Service: General;  Laterality: Right;   CESAREAN SECTION     x 2   COLONOSCOPY N/A 02/08/2013   Procedure: COLONOSCOPY;  Surgeon: Theda Belfast, MD;  Location: [facility]COPY;  Service: Endoscopy;  Laterality: N/A;   RADIOACTIVE SEED GUIDED EXCISIONAL BREAST BIOPSY Right 04/21/2023   Procedure: RADIOACTIVE SEED GUIDED EXCISIONAL RIGHT BREAST BIOPSY;  Surgeon: Emelia Loron, MD;  Location: [facility];  Service: General;  Laterality: Right;   TONSILLECTOMY     TUBAL LIGATION     VULVECTOMY PARTIAL  08/2004   For VIN-II    OB History   No obstetric history on file.      Home Medications    Prior to Admission medications   Medication Sig Start Date End Date Taking? Authorizing Provider  tamsulosin (FLOMAX) 0.4 MG CAPS capsule Take 1 capsule (0.4 mg total) by mouth daily after supper. 08/08/23  Yes Mickie Bail, NP  amLODipine (NORVASC) 10 MG tablet TAKE 1 TABLET BY MOUTH EVERY DAY 07/06/23   Doreene Eland, MD  azithromycin (ZITHROMAX) 250 MG tablet Take 1 tablet (250 mg total) by mouth daily. Take first 2 tablets together, then 1 every day until finished. 05/27/23   Valinda Hoar, NP  cetirizine (ZYRTEC) 10 MG tablet Take 10 mg by mouth daily.    [provider]  esomeprazole (NEXIUM) 40 MG capsule TAKE 1 CAPSULE (40 MG TOTAL) BY MOUTH DAILY. 06/12/23   McDiarmid, Leighton Roach, MD  ferrous sulfate 325 (65 FE) MG tablet Take 325 mg by mouth every other day.    [provider]  magic mouthwash (lidocaine, diphenhydrAMINE, alum & mag hydroxide) suspension Swish and spit 5 mLs 4 (four) times daily as needed for mouth pain. 05/27/23   White, Elita Boone, NP  tiZANidine (ZANAFLEX) 2 MG tablet Take 1 tablet (2 mg total) by mouth every 8 (eight) hours as needed for muscle spasms. 05/27/23   Valinda Hoar, NP  triamterene-hydrochlorothiazide  (MAXZIDE-25) 37.5-25 MG tablet TAKE 1/2 TABLET BY MOUTH EVERY DAY 12/01/22   McDiarmid, Leighton Roach, MD    Family History Family History  Problem Relation Age of Onset   Hypertension Mother    Diabetes Mother    Nephrolithiasis Mother    Stroke Mother    Early death Father        ?Encephalitis   Factor V Leiden deficiency Maternal Grandmother    Stroke Maternal Grandfather     Social History Social History   Tobacco Use   Smoking status: Former    Current packs/day: 0.00    Average packs/day: 1 pack/day for 15.7 years (15.7 ttl pk-yrs)    Types: Cigarettes    Start date: 10/07/1987    Quit date: 07/07/2003    Years since quitting: 20.1   Smokeless tobacco: Never  Substance Use Topics  Alcohol use: No    Alcohol/week: 0.0 standard drinks of alcohol   Drug use: No     Allergies   Codeine, Prednisone, and Pregabalin   Review of Systems Review of Systems  Constitutional:  Negative for chills and fever.  Gastrointestinal:  Negative for abdominal pain, nausea and vomiting.  Genitourinary:  Positive for flank pain. Negative for dysuria, frequency, hematuria and pelvic pain.  Musculoskeletal:  Positive for back pain. Negative for gait problem.  Skin:  Negative for color change, rash and wound.  Neurological:  Negative for weakness and numbness.     Physical Exam Triage Vital Signs ED Triage Vitals  Encounter Vitals Group     BP      Systolic BP Percentile      Diastolic BP Percentile      Pulse      Resp      Temp      Temp src      SpO2      Weight      Height      Head Circumference      Peak Flow      Pain Score      Pain Loc      Pain Education      Exclude from Growth Chart    No data found.  Updated Vital Signs BP (!) 144/96   Pulse 88   Temp 97.8 F (36.6 C)   Resp 18   SpO2 96%   Visual Acuity Right Eye Distance:   Left Eye Distance:   Bilateral Distance:    Right Eye Near:   Left Eye Near:    Bilateral Near:     Physical  Exam Constitutional:      General: She is not in acute distress.    Appearance: She is obese.  HENT:     Mouth/Throat:     Mouth: Mucous membranes are moist.  Cardiovascular:     Rate and Rhythm: Normal rate and regular rhythm.  Pulmonary:     Effort: Pulmonary effort is normal. No respiratory distress.  Abdominal:     General: Bowel sounds are normal.     Palpations: Abdomen is soft.     Tenderness: There is no abdominal tenderness. There is no right CVA tenderness, left CVA tenderness, guarding or rebound.  Musculoskeletal:        General: Tenderness present. No deformity. Normal range of motion.     Comments: Tenderness to palpation of right lower back.  Skin:    General: Skin is warm and dry.     Findings: No bruising, erythema, lesion or rash.  Neurological:     General: No focal deficit present.     Mental Status: She is alert and oriented to person, place, and time.     Sensory: No sensory deficit.     Motor: No weakness.     Gait: Gait normal.      UC Treatments / Results  Labs (all labs ordered are listed, but only abnormal results are displayed) Labs Reviewed  POCT URINALYSIS DIP (MANUAL ENTRY) - Abnormal; Notable for the following components:      Result Value   Blood, UA trace-intact (*)    All other components within normal limits    EKG   Radiology No results found.  Procedures Procedures (including critical care time)  Medications Ordered in UC Medications  ketorolac (TORADOL) 30 MG/ML injection 30 mg (30 mg Intramuscular Given 08/08/23 1615)    Initial Impression /  Assessment and Plan / UC Course  I have reviewed the triage vital signs and the nursing notes.  Pertinent labs & imaging results that were available during my care of the patient were reviewed by me and considered in my medical decision making (see chart for details).    Right flank pain, history of kidney stones, right lower back pain.  Afebrile.  Urine has trace blood.  Patient  reports her current symptoms are similar to previous episodes of kidney stones.  She request treatment with Toradol and Flomax as these worked well in the past.  She declines transfer to the ED.  Toradol given here and 14-day prescription for Flomax sent to pharmacy.  Instructed her to follow-up with her PCP on Monday.  Strict ED precautions given for worsening symptoms.  Education provided on flank pain and kidney stones.  Patient agrees to plan of care.  Final Clinical Impressions(s) / UC Diagnoses   Final diagnoses:  Right flank pain  History of kidney stones  Acute right-sided low back pain without sciatica     Discharge Instructions      You were given an injection of Toradol today.  Do not take any over-the-counter medications for the rest of today.    Take the tamsulosin as directed.    Follow up with your primary care provider on Monday.  Go to the emergency department if you have worsening symptoms.        ED Prescriptions     Medication Sig Dispense Auth. Provider   tamsulosin (FLOMAX) 0.4 MG CAPS capsule Take 1 capsule (0.4 mg total) by mouth daily after supper. 14 capsule Mickie Bail, NP      PDMP not reviewed this encounter.   Mickie Bail, NP 08/08/23 [phone]

## 2023-08-08 NOTE — Discharge Instructions (Addendum)
You were given an injection of Toradol today.  Do not take any over-the-counter medications for the rest of today.    Take the tamsulosin as directed.    Follow up with your primary care provider on Monday.  Go to the emergency department if you have worsening symptoms.

## 2023-08-08 NOTE — ED Triage Notes (Signed)
Patient to Urgent Care with complaints of right sided lower back pain that radiates into her mid back.  Denies any injury. Hx of the same and diagnosed with a kidney stone.   Symptoms started 10/24. Has been using icy-hot/ otc pain meds.

## 2023-08-12 ENCOUNTER — Other Ambulatory Visit: Payer: Self-pay

## 2023-08-12 ENCOUNTER — Emergency Department
Admission: EM | Admit: 2023-08-12 | Discharge: 2023-08-12 | Disposition: A | Discharge status: Home / Self Care | Payer: BC Managed Care – PPO | Attending: Emergency Medicine | Admitting: Emergency Medicine

## 2023-08-12 ENCOUNTER — Emergency Department: Payer: BC Managed Care – PPO

## 2023-08-12 DIAGNOSIS — R319 Hematuria, unspecified: Secondary | ICD-10-CM | POA: Diagnosis not present

## 2023-08-12 DIAGNOSIS — R9431 Abnormal electrocardiogram [ECG] [EKG]: Secondary | ICD-10-CM | POA: Diagnosis not present

## 2023-08-12 DIAGNOSIS — R109 Unspecified abdominal pain: Secondary | ICD-10-CM | POA: Diagnosis not present

## 2023-08-12 DIAGNOSIS — R1031 Right lower quadrant pain: Secondary | ICD-10-CM | POA: Insufficient documentation

## 2023-08-12 DIAGNOSIS — D252 Subserosal leiomyoma of uterus: Secondary | ICD-10-CM | POA: Diagnosis not present

## 2023-08-12 LAB — COMPREHENSIVE METABOLIC PANEL
ALT: 20 U/L (ref 0–44)
AST: 20 U/L (ref 15–41)
Albumin: 3.4 g/dL — ABNORMAL LOW (ref 3.5–5.0)
Alkaline Phosphatase: 89 U/L (ref 38–126)
Anion gap: 9 (ref 5–15)
BUN: 13 mg/dL (ref 6–20)
CO2: 25 mmol/L (ref 22–32)
Calcium: 8.6 mg/dL — ABNORMAL LOW (ref 8.9–10.3)
Chloride: 104 mmol/L (ref 98–111)
Creatinine, Ser: 0.77 mg/dL (ref 0.44–1.00)
GFR, Estimated: >60 mL/min (ref >60)
Glucose, Bld: 118 mg/dL — ABNORMAL HIGH (ref 70–99)
Potassium: 3.9 mmol/L (ref 3.5–5.1)
Sodium: 138 mmol/L (ref 135–145)
Total Bilirubin: 0.6 mg/dL (ref <1.2)
Total Protein: 6.7 g/dL (ref 6.5–8.1)

## 2023-08-12 LAB — CBC WITH DIFFERENTIAL/PLATELET
Abs Immature Granulocytes: 0.02 10*3/uL (ref 0.00–0.07)
Basophils Absolute: 0.1 10*3/uL (ref 0.0–0.1)
Basophils Relative: 1 %
Eosinophils Absolute: 0.2 10*3/uL (ref 0.0–0.5)
Eosinophils Relative: 3 %
HCT: 42.1 % (ref 36.0–46.0)
Hemoglobin: 13.3 g/dL (ref 12.0–15.0)
Immature Granulocytes: 0 %
Lymphocytes Relative: 27 %
Lymphs Abs: 1.7 10*3/uL (ref 0.7–4.0)
MCH: 26.2 pg (ref 26.0–34.0)
MCHC: 31.6 g/dL (ref 30.0–36.0)
MCV: 82.9 fL (ref 80.0–100.0)
Monocytes Absolute: 0.4 10*3/uL (ref 0.1–1.0)
Monocytes Relative: 7 %
Neutro Abs: 4 10*3/uL (ref 1.7–7.7)
Neutrophils Relative %: 62 %
Platelets: 317 10*3/uL (ref 150–400)
RBC: 5.08 MIL/uL (ref 3.87–5.11)
RDW: 14.9 % (ref 11.5–15.5)
WBC: 6.4 10*3/uL (ref 4.0–10.5)
nRBC: 0 % (ref 0.0–0.2)

## 2023-08-12 LAB — URINALYSIS, ROUTINE W REFLEX MICROSCOPIC
Bacteria, UA: NONE SEEN
Bilirubin Urine: NEGATIVE
Glucose, UA: NEGATIVE mg/dL
Ketones, ur: NEGATIVE mg/dL
Nitrite: NEGATIVE
Protein, ur: NEGATIVE mg/dL
Specific Gravity, Urine: 1.011 (ref 1.005–1.030)
pH: 6 (ref 5.0–8.0)

## 2023-08-12 LAB — LIPASE, BLOOD: Lipase: 25 U/L (ref 11–51)

## 2023-08-12 LAB — LACTIC ACID, PLASMA: Lactic Acid, Venous: 1.4 mmol/L (ref 0.5–1.9)

## 2023-08-12 MED ORDER — KETOROLAC TROMETHAMINE 10 MG PO TABS: 10.0000 mg | ORAL_TABLET | Freq: Four times a day (QID) | ORAL | 0 refills | Status: DC | PRN

## 2023-08-12 MED ORDER — KETOROLAC TROMETHAMINE 30 MG/ML IJ SOLN
60.0000 mg | Freq: Once | INTRAMUSCULAR | Status: DC
  Filled 2023-08-12: qty 2

## 2023-08-12 MED ORDER — HYDROCODONE-ACETAMINOPHEN 5-325 MG PO TABS: 1.0000 | ORAL_TABLET | ORAL | 0 refills | Status: DC | PRN

## 2023-08-12 MED ORDER — KETOROLAC TROMETHAMINE 30 MG/ML IJ SOLN
15.0000 mg | Freq: Once | INTRAMUSCULAR | Status: AC
  Administered 2023-08-12: 15 mg via INTRAVENOUS

## 2023-08-12 MED ORDER — HYDROCODONE-ACETAMINOPHEN 5-325 MG PO TABS
1.0000 | ORAL_TABLET | Freq: Once | ORAL | Status: AC
  Administered 2023-08-12: 1 via ORAL
  Filled 2023-08-12: qty 1

## 2023-08-12 NOTE — ED Provider Notes (Signed)
[facility] Provider Note   Event Date/Time   First MD Initiated Contact with Patient 08/12/23 0750     (approximate) History  Flank Pain  HPI Carla Berry is a 52 y.o. female with a stated past medical history of recurrent kidney stones who presents for right flank pain that has been worsening over the last 4 days and despite treatment by urgent care with tamsulosin.  Patient states that she was given Toradol which helped with the pain initially however pain has not radiated from the flank down into the right lower quadrant and into the groin.  Patient states that this pain is similar to kidney stones she has had in the past was told to present to the emergency department if the pain has worsened which it has. ROS: Patient currently denies any vision changes, tinnitus, difficulty speaking, facial droop, sore throat, chest pain, shortness of breath, nausea/vomiting/diarrhea, dysuria, or weakness/numbness/paresthesias in any extremity   Physical Exam  Triage Vital Signs: ED Triage Vitals  Encounter Vitals Group     BP --      Systolic BP Percentile --      Diastolic BP Percentile --      Pulse --      Resp --      Temp --      Temp src --      SpO2 --      Weight 08/12/23 0747 260 lb (117.9 kg)     Height 08/12/23 0747 5\' 3"  (1.6 m)     Head Circumference --      Peak Flow --      Pain Score 08/12/23 0746 10     Pain Loc --      Pain Education --      Exclude from Growth Chart --    Most recent vital signs: Vitals:   08/12/23 0752  BP: (!) 143/103  Pulse: 93  Resp: 18  Temp: 98.7 F (37.1 C)  SpO2: 96%   General: Awake, oriented x4. CV:  Good peripheral perfusion.  Resp:  Normal effort.  Abd:  No distention.  Right CVA tenderness to percussion Other:  Middle-aged morbidly obese Caucasian female resting comfortably in no acute distress ED Results / Procedures / Treatments  Labs (all labs ordered are listed, but only abnormal results are  displayed) Labs Reviewed  URINALYSIS, ROUTINE W REFLEX MICROSCOPIC - Abnormal; Notable for the following components:      Result Value   Color, Urine YELLOW (*)    APPearance CLEAR (*)    Hgb urine dipstick SMALL (*)    Leukocytes,Ua TRACE (*)    All other components within normal limits  COMPREHENSIVE METABOLIC PANEL - Abnormal; Notable for the following components:   Glucose, Bld 118 (*)    Calcium 8.6 (*)    Albumin 3.4 (*)    All other components within normal limits  LIPASE, BLOOD  CBC WITH DIFFERENTIAL/PLATELET  LACTIC ACID, PLASMA  LACTIC ACID, PLASMA   RADIOLOGY ED MD interpretation: CT of the abdomen pelvis without IV contrast shows mild mural thickening of the cecum and proximal ascending colon which may be related to mild colitis without any appreciated renal calculi or hydronephrosis -Agree with radiology assessment Official radiology report(s): CT Renal Stone Study  Result Date: 08/12/2023 CLINICAL DATA:  Right flank pain with hematuria EXAM: CT ABDOMEN AND PELVIS WITHOUT CONTRAST TECHNIQUE: Multidetector CT imaging of the abdomen and pelvis was performed following the standard protocol without IV contrast.  RADIATION DOSE REDUCTION: This exam was performed according to the departmental dose-optimization program which includes automated exposure control, adjustment of the mA and/or kV according to patient size and/or use of iterative reconstruction technique. COMPARISON:  CT abdomen and pelvis dated 11/27/2017 FINDINGS: Lower chest: No focal consolidation or pulmonary nodule in the lung bases. No pleural effusion or pneumothorax demonstrated. Partially imaged heart size is normal. Hepatobiliary: No focal hepatic lesions. No intra or extrahepatic biliary ductal dilation. Normal gallbladder. Pancreas: No focal lesions or main ductal dilation. Spleen: Normal in size without focal abnormality. Adrenals/Urinary Tract: No adrenal nodules. No suspicious renal mass, calculi or  hydronephrosis. No focal bladder wall thickening. Stomach/Bowel: Normal appearance of the stomach. No abnormal bowel dilation. Mild mural thickening of the cecum and proximal ascending colon. Mobile cecum is located in the midline lower abdomen. Normal appendix. Vascular/Lymphatic: No significant vascular findings are present. No enlarged abdominal or pelvic lymph nodes. Reproductive: No adnexal masses. Slightly bulky appearance of the uterus with subserosal uterine mass arising from the anterior uterine body measuring 2.9 x 2.1 cm (6:80), increased in size from 2.0 x 1.1 cm, likely leiomyomas. Other: No free fluid, fluid collection, or free air. Musculoskeletal: No acute or abnormal lytic or blastic osseous lesions. Multilevel degenerative changes of the partially imaged thoracic and lumbar spine. Small fat-containing paraumbilical hernia. IMPRESSION: 1. Mild mural thickening of the cecum and proximal ascending colon, which may be related to mild colitis. 2. No renal calculi or hydronephrosis. 3. Slightly bulky appearance of the uterus with subserosal uterine mass arising from the anterior uterine body measuring 2.9 x 2.1 cm, increased in size from 2.0 x 1.1 cm, likely leiomyomas. 4. Small fat-containing paraumbilical hernia. Electronically Signed   By: Agustin Cree M.D.   On: 08/12/2023 09:19   PROCEDURES: Critical Care performed: No .1-3 Lead EKG Interpretation  Performed by: Merwyn Katos, MD Authorized by: Merwyn Katos, MD     Interpretation: normal     ECG rate:  81   ECG rate assessment: normal     Rhythm: sinus rhythm     Ectopy: none     Conduction: normal    MEDICATIONS ORDERED IN ED: Medications  HYDROcodone-acetaminophen (NORCO/VICODIN) 5-325 MG per tablet 1 tablet (1 tablet Oral Given 08/12/23 0822)  ketorolac (TORADOL) 30 MG/ML injection 15 mg (15 mg Intravenous Given 08/12/23 0823)   IMPRESSION / MDM / ASSESSMENT AND PLAN / ED COURSE  I reviewed the triage vital signs and the  nursing notes.                             The patient is on the cardiac monitor to evaluate for evidence of arrhythmia and/or significant heart rate changes. Patient's presentation is most consistent with acute presentation with potential threat to life or bodily function. Patient's symptoms not typical for emergent causes of abdominal pain such as, but not limited to, appendicitis, abdominal aortic aneurysm, surgical biliary disease, pancreatitis, SBO, mesenteric ischemia, serious intra-abdominal bacterial illness. Presentation also not typical of gynecologic emergencies such as TOA, Ovarian Torsion, PID. Not Ectopic. Doubt atypical ACS.  Pt tolerating PO. Disposition: Patient will be discharged with strict return precautions and follow up with primary MD within 12-24 hours for further evaluation. Patient understands that this still may have an early presentation of an emergent medical condition such as appendicitis that will require a recheck.   FINAL CLINICAL IMPRESSION(S) / ED DIAGNOSES   Final diagnoses:  Right flank pain   Rx / DC Orders   ED Discharge Orders          Ordered    ketorolac (TORADOL) 10 MG tablet  Every 6 hours PRN,   Status:  Discontinued       Note to Pharmacy: Patient given an IM/IV loading dose in emergency department   08/12/23 1009    HYDROcodone-acetaminophen (NORCO) 5-325 MG tablet  Every 4 hours PRN,   Status:  Discontinued        08/12/23 1009    ketorolac (TORADOL) 10 MG tablet  Every 6 hours PRN,   Status:  Discontinued       Note to Pharmacy: Patient given an IM/IV loading dose in emergency department   08/12/23 1059    HYDROcodone-acetaminophen (NORCO) 5-325 MG tablet  Every 4 hours PRN,   Status:  Discontinued        08/12/23 1059    HYDROcodone-acetaminophen (NORCO) 5-325 MG tablet  Every 4 hours PRN,   Status:  Discontinued        08/12/23 1100    ketorolac (TORADOL) 10 MG tablet  Every 6 hours PRN,   Status:  Discontinued       Note to  Pharmacy: Patient given an IM/IV loading dose in emergency department   08/12/23 1100    HYDROcodone-acetaminophen (NORCO) 5-325 MG tablet  Every 4 hours PRN        08/12/23 1104    ketorolac (TORADOL) 10 MG tablet  Every 6 hours PRN       Note to Pharmacy: Patient given an IM/IV loading dose in emergency department   08/12/23 1104           Note:  This document was prepared using Dragon voice recognition software and may include unintentional dictation errors.   Merwyn Katos, MD 08/12/23 1106

## 2023-08-12 NOTE — ED Triage Notes (Signed)
Reports right flank pain. Told by UC she had blood in her urine. Started flomax Saturday. Reports pain has worsened.   History kidney stones

## 2023-08-19 DIAGNOSIS — F331 Major depressive disorder, recurrent, moderate: Secondary | ICD-10-CM | POA: Diagnosis not present

## 2023-09-16 DIAGNOSIS — F331 Major depressive disorder, recurrent, moderate: Secondary | ICD-10-CM | POA: Diagnosis not present

## 2023-09-28 ENCOUNTER — Other Ambulatory Visit: Payer: Self-pay | Admitting: Gastroenterology

## 2023-09-28 DIAGNOSIS — K760 Fatty (change of) liver, not elsewhere classified: Secondary | ICD-10-CM

## 2023-10-03 ENCOUNTER — Other Ambulatory Visit: Payer: Self-pay | Admitting: Family Medicine

## 2023-10-03 DIAGNOSIS — Z79899 Other long term (current) drug therapy: Secondary | ICD-10-CM

## 2023-10-13 DIAGNOSIS — F331 Major depressive disorder, recurrent, moderate: Secondary | ICD-10-CM | POA: Diagnosis not present

## 2023-10-21 ENCOUNTER — Ambulatory Visit
Admission: RE | Admit: 2023-10-21 | Discharge: 2023-10-21 | Disposition: A | Discharge status: Home / Self Care | Payer: BC Managed Care – PPO | Source: Ambulatory Visit | Attending: Gastroenterology | Admitting: Gastroenterology

## 2023-10-21 DIAGNOSIS — K760 Fatty (change of) liver, not elsewhere classified: Secondary | ICD-10-CM

## 2023-10-27 ENCOUNTER — Encounter: Payer: Self-pay | Admitting: Physician Assistant

## 2023-10-27 ENCOUNTER — Ambulatory Visit: Payer: Self-pay | Admitting: Physician Assistant

## 2023-10-27 VITALS — BP 130/80 | HR 98 | Temp 99.6°F | Resp 19 | Ht 64.0 in | Wt 250.0 lb

## 2023-10-27 DIAGNOSIS — J329 Chronic sinusitis, unspecified: Secondary | ICD-10-CM

## 2023-10-27 DIAGNOSIS — J069 Acute upper respiratory infection, unspecified: Secondary | ICD-10-CM | POA: Insufficient documentation

## 2023-10-27 HISTORY — DX: Chronic sinusitis, unspecified: J32.9

## 2023-10-27 MED ORDER — AZITHROMYCIN 250 MG PO TABS: ORAL_TABLET | ORAL | 0 refills | Status: DC

## 2023-10-27 NOTE — Patient Instructions (Signed)
Temp. 99.6. Heart rate 98 - 100. Oxygen level 94% on room air. Please increase fluids, especially water.  Get lots of rest. Use you mask and wash hands frequently until  symptoms resolve completely.  Use Delsym every 12 hours for cough. Use Allegra D every 12 hours for congestion.  Use 2 tylenol extra strength every 4 or 6 hours for the next 3 days, then use as needed. Zithromax 2 tablets day one, then one tablet daily until all finished. Return to clinic or go to the Emergency Dept. If not improving.

## 2023-10-27 NOTE — Progress Notes (Signed)
  Subjective:     Patient ID: Carla Berry, female   DOB: [birth year], 53 y.o.   MRN: [id number]  URI  This is a new problem. The current episode started in the past 7 days. The problem has been gradually worsening. The maximum temperature recorded prior to her arrival was 103 - 104 F. The fever has been present for 1 to 2 days. Associated symptoms include congestion, coughing, headaches, nausea, sinus pain and a sore throat. Pertinent negatives include no chest pain, diarrhea, dysuria, ear pain, neck pain, rash, swollen glands, vomiting or wheezing. She has tried acetaminophen for the symptoms. The treatment provided mild relief.     Review of Systems  Constitutional:  Positive for activity change, appetite change and fever.  HENT:  Positive for congestion, sinus pressure, sinus pain and sore throat. Negative for ear pain.   Respiratory:  Positive for cough. Negative for wheezing.   Cardiovascular:  Negative for chest pain.  Gastrointestinal:  Positive for nausea. Negative for diarrhea and vomiting.  Genitourinary:  Negative for dysuria.  Musculoskeletal:  Positive for myalgias. Negative for neck pain.  Skin:  Negative for rash.  Neurological:  Positive for headaches.       Objective:   Physical Exam Constitutional:      Appearance: She is ill-appearing.  HENT:     Head: Normocephalic.     Right Ear: Tympanic membrane and ear canal normal.     Left Ear: Tympanic membrane and ear canal normal.     Nose: Congestion present.     Comments: Sensitive to palpation over the maxillary sinuses.    Mouth/Throat:     Mouth: Mucous membranes are moist.     Pharynx: Oropharynx is clear.  Eyes:     Pupils: Pupils are equal, round, and reactive to light.  Cardiovascular:     Rate and Rhythm: Normal rate and regular rhythm.     Pulses: Normal pulses.     Heart sounds: Normal heart sounds. No murmur heard.    No friction rub.  Pulmonary:     Effort: Pulmonary effort is normal.      Comments: Course breath sounds. No consolidation heard. No wheezes. Breath sounds symmetrical. Abdominal:     General: Bowel sounds are normal.  Musculoskeletal:     Cervical back: Normal range of motion and neck supple.  Skin:    General: Skin is warm.     Findings: No rash.  Neurological:     General: No focal deficit present.     Mental Status: She is alert.        Assessment:    1. Sinusitis 2. Upper respiratory infection.    Plan:    Temp. 99.6. Heart rate 98 - 100. Oxygen level 94% on room air. Please increase fluids, especially water.  Get lots of rest. Use you mask and wash hands frequently until  symptoms resolve completely.  Use Delsym every 12 hours for cough. Use Allegra D every 12 hours for congestion.  Use 2 tylenol extra strength every 4 or 6 hours for the next 3 days, then use as needed. Zithromax 2 tablets day one, then one tablet daily until all finished. Return to clinic or go to the Emergency Dept. If not improving.

## 2023-11-10 DIAGNOSIS — F331 Major depressive disorder, recurrent, moderate: Secondary | ICD-10-CM | POA: Diagnosis not present

## 2023-11-30 ENCOUNTER — Other Ambulatory Visit: Payer: Self-pay | Admitting: Family Medicine

## 2023-11-30 DIAGNOSIS — E876 Hypokalemia: Secondary | ICD-10-CM

## 2023-11-30 DIAGNOSIS — I10 Essential (primary) hypertension: Secondary | ICD-10-CM

## 2023-12-16 DIAGNOSIS — F331 Major depressive disorder, recurrent, moderate: Secondary | ICD-10-CM | POA: Diagnosis not present

## 2023-12-18 ENCOUNTER — Other Ambulatory Visit: Payer: Self-pay | Admitting: General Surgery

## 2023-12-18 DIAGNOSIS — N6452 Nipple discharge: Secondary | ICD-10-CM

## 2023-12-30 ENCOUNTER — Ambulatory Visit
Admission: RE | Admit: 2023-12-30 | Discharge: 2023-12-30 | Disposition: A | Discharge status: Home / Self Care | Source: Ambulatory Visit | Attending: General Surgery | Admitting: General Surgery

## 2023-12-30 ENCOUNTER — Other Ambulatory Visit: Payer: Self-pay | Admitting: General Surgery

## 2023-12-30 DIAGNOSIS — N6315 Unspecified lump in the right breast, overlapping quadrants: Secondary | ICD-10-CM | POA: Diagnosis not present

## 2023-12-30 DIAGNOSIS — N6452 Nipple discharge: Secondary | ICD-10-CM

## 2023-12-30 DIAGNOSIS — N6310 Unspecified lump in the right breast, unspecified quadrant: Secondary | ICD-10-CM

## 2024-01-05 ENCOUNTER — Ambulatory Visit
Admission: RE | Admit: 2024-01-05 | Discharge: 2024-01-05 | Disposition: A | Discharge status: Home / Self Care | Source: Ambulatory Visit | Attending: General Surgery | Admitting: General Surgery

## 2024-01-05 DIAGNOSIS — N6341 Unspecified lump in right breast, subareolar: Secondary | ICD-10-CM | POA: Diagnosis not present

## 2024-01-05 DIAGNOSIS — N6031 Fibrosclerosis of right breast: Secondary | ICD-10-CM | POA: Diagnosis not present

## 2024-01-05 DIAGNOSIS — R92321 Mammographic fibroglandular density, right breast: Secondary | ICD-10-CM | POA: Diagnosis not present

## 2024-01-05 DIAGNOSIS — N6310 Unspecified lump in the right breast, unspecified quadrant: Secondary | ICD-10-CM

## 2024-01-05 DIAGNOSIS — N6452 Nipple discharge: Secondary | ICD-10-CM

## 2024-01-05 HISTORY — PX: BREAST BIOPSY: SHX20

## 2024-01-06 LAB — SURGICAL PATHOLOGY

## 2024-01-08 ENCOUNTER — Other Ambulatory Visit: Payer: Self-pay | Admitting: Family Medicine

## 2024-01-08 DIAGNOSIS — Z79899 Other long term (current) drug therapy: Secondary | ICD-10-CM

## 2024-01-08 NOTE — Telephone Encounter (Signed)
[facility] needs appointment [facility] physician before further refills  

## 2024-01-11 NOTE — Telephone Encounter (Signed)
 Called patient to get her scheduled with a physician but patient stated that she would rather see you instead. I informed the patient that you were booked but patient stated she will call back to schedule an appt.

## 2024-01-20 DIAGNOSIS — F4310 Post-traumatic stress disorder, unspecified: Secondary | ICD-10-CM | POA: Diagnosis not present

## 2024-02-04 LAB — HM PAP SMEAR: HM Pap smear: NORMAL

## 2024-02-16 DIAGNOSIS — F4310 Post-traumatic stress disorder, unspecified: Secondary | ICD-10-CM | POA: Diagnosis not present

## 2024-03-01 ENCOUNTER — Other Ambulatory Visit: Payer: Self-pay | Admitting: Family Medicine

## 2024-03-01 DIAGNOSIS — Z1231 Encounter for screening mammogram for malignant neoplasm of breast: Secondary | ICD-10-CM

## 2024-03-15 DIAGNOSIS — F4310 Post-traumatic stress disorder, unspecified: Secondary | ICD-10-CM | POA: Diagnosis not present

## 2024-03-17 ENCOUNTER — Ambulatory Visit (INDEPENDENT_AMBULATORY_CARE_PROVIDER_SITE_OTHER): Admitting: Family Medicine

## 2024-03-17 ENCOUNTER — Other Ambulatory Visit: Payer: Self-pay | Admitting: Medical Genetics

## 2024-03-17 ENCOUNTER — Encounter: Payer: Self-pay | Admitting: Family Medicine

## 2024-03-17 ENCOUNTER — Ambulatory Visit
Admission: RE | Admit: 2024-03-17 | Discharge: 2024-03-17 | Disposition: A | Discharge status: Home / Self Care | Source: Ambulatory Visit | Attending: Family Medicine | Admitting: Family Medicine

## 2024-03-17 ENCOUNTER — Ambulatory Visit

## 2024-03-17 VITALS — BP 132/92 | HR 82 | Ht 64.0 in | Wt 268.8 lb

## 2024-03-17 DIAGNOSIS — Z79899 Other long term (current) drug therapy: Secondary | ICD-10-CM

## 2024-03-17 DIAGNOSIS — K760 Fatty (change of) liver, not elsewhere classified: Secondary | ICD-10-CM | POA: Diagnosis not present

## 2024-03-17 DIAGNOSIS — I10 Essential (primary) hypertension: Secondary | ICD-10-CM | POA: Diagnosis not present

## 2024-03-17 DIAGNOSIS — R7303 Prediabetes: Secondary | ICD-10-CM | POA: Diagnosis not present

## 2024-03-17 DIAGNOSIS — I83893 Varicose veins of bilateral lower extremities with other complications: Secondary | ICD-10-CM

## 2024-03-17 DIAGNOSIS — I872 Venous insufficiency (chronic) (peripheral): Secondary | ICD-10-CM

## 2024-03-17 DIAGNOSIS — R600 Localized edema: Secondary | ICD-10-CM

## 2024-03-17 DIAGNOSIS — R0789 Other chest pain: Secondary | ICD-10-CM

## 2024-03-17 DIAGNOSIS — M797 Fibromyalgia: Secondary | ICD-10-CM

## 2024-03-17 DIAGNOSIS — Z1231 Encounter for screening mammogram for malignant neoplasm of breast: Secondary | ICD-10-CM

## 2024-03-17 DIAGNOSIS — R748 Abnormal levels of other serum enzymes: Secondary | ICD-10-CM

## 2024-03-17 DIAGNOSIS — M791 Myalgia, unspecified site: Secondary | ICD-10-CM

## 2024-03-17 LAB — POCT GLYCOSYLATED HEMOGLOBIN (HGB A1C): HbA1c, POC (prediabetic range): 6.3 % (ref 5.7–6.4)

## 2024-03-17 MED ORDER — CYCLOBENZAPRINE HCL 5 MG PO TABS: 5.0000 mg | ORAL_TABLET | Freq: Every evening | ORAL | 2 refills | Status: DC | PRN

## 2024-03-17 NOTE — Progress Notes (Signed)
 Carla Berry is accompanied by daughter, Elah Avellino Sources of clinical information for visit is/are patient and past medical records. Nursing assessment for this office visit was reviewed with the patient for accuracy and revision.     Previous Report(s) Reviewed: historical medical records and office notes     12/25/2022    2:08 PM  Depression screen PHQ 2/9  Decreased Interest 1  Down, Depressed, Hopeless 1  PHQ - 2 Score 2  Altered sleeping 1  Tired, decreased energy 1  Change in appetite 1  Feeling bad or failure about yourself  1  Trouble concentrating 0  Moving slowly or fidgety/restless 0  Suicidal thoughts 0  PHQ-9 Score 6  Difficult doing work/chores Somewhat difficult   Flowsheet Row Office Visit from 12/25/2022 in [facility] - [facility] Dept Of [place]. [facility] Office Visit from 03/13/2022 in [facility] - [facility] Dept Of [place]. [facility] Office Visit from 12/13/2020 in [facility] - A Dept Of [place]. [facility]  Thoughts that you would be better off dead, or of hurting yourself in some way Not at all Not at all Not at all  PHQ-9 Total Score 6 4 7        07/14/2019    2:17 PM 09/18/2017    4:35 PM 04/16/2017   10:19 AM 05/15/2016    8:55 AM 04/10/2016   10:56 AM  Fall Risk   Falls in the past year? 0  No  No  No  No      Data saved with a previous flowsheet row definition       12/25/2022    2:08 PM 03/13/2022    9:48 AM 12/13/2020    3:50 PM  PHQ9 SCORE ONLY  PHQ-9 Total Score 6 4 7     There are no preventive care reminders to display for this patient.  Health Maintenance Due  Topic Date Due   DTaP/Tdap/Td (3 - Td or Tdap) 04/29/2023    Outpatient Medications Prior to Visit  Medication Sig Dispense Refill   amLODipine  (NORVASC ) 10 MG tablet TAKE 1 TABLET BY MOUTH EVERY DAY 90 tablet 0   cetirizine  (ZYRTEC ) 10 MG tablet Take 10 mg by mouth daily.      triamterene -hydrochlorothiazide  (MAXZIDE-25) 37.5-25 MG tablet TAKE 1/2 TABLET BY MOUTH DAILY 45 tablet 3   esomeprazole  (NEXIUM ) 40 MG capsule TAKE 1 CAPSULE (40 MG TOTAL) BY MOUTH DAILY. 90 capsule 3     History/P.E. limitations: none  There are no preventive care reminders to display for this patient. There are no preventive care reminders to display for this patient.  Health Maintenance Due  Topic Date Due   DTaP/Tdap/Td (3 - Td or Tdap) 04/29/2023     Chief Complaint  Patient presents with   Medication Refill   Hypertension   Chest Pain     --------------------------------------------------------------------------------------------------------------------------------------------- Visit Problem List with A/P  No problem-specific Assessment & Plan notes found for this encounter.

## 2024-03-17 NOTE — Patient Instructions (Signed)
 We are checking your kidney, liver, electrolytes, blood sugar, A1c.  Please schedule an appointment with Dr Koval at [facility] to have your breathing assessed with spirometry.  A referral was sent to Physical Therapy to work on the tender points from your likely fibromyalgia.   Cyclobenzaprine  was refilled.  Use as needed for sleep and muscle relaxation.    Consider purchasing a pair of Sockwell knee high stockings that provide compression for the varicose veins.  It is easiest to purchase them from [employer].

## 2024-03-18 ENCOUNTER — Encounter: Payer: Self-pay | Admitting: Family Medicine

## 2024-03-18 DIAGNOSIS — R748 Abnormal levels of other serum enzymes: Secondary | ICD-10-CM | POA: Insufficient documentation

## 2024-03-18 DIAGNOSIS — I83893 Varicose veins of bilateral lower extremities with other complications: Secondary | ICD-10-CM | POA: Insufficient documentation

## 2024-03-18 DIAGNOSIS — M797 Fibromyalgia: Secondary | ICD-10-CM | POA: Insufficient documentation

## 2024-03-18 DIAGNOSIS — I872 Venous insufficiency (chronic) (peripheral): Secondary | ICD-10-CM | POA: Insufficient documentation

## 2024-03-18 DIAGNOSIS — R7303 Prediabetes: Secondary | ICD-10-CM | POA: Insufficient documentation

## 2024-03-18 DIAGNOSIS — R0789 Other chest pain: Secondary | ICD-10-CM

## 2024-03-18 DIAGNOSIS — R600 Localized edema: Secondary | ICD-10-CM | POA: Insufficient documentation

## 2024-03-18 LAB — LIPID PANEL
Chol/HDL Ratio: 2.9 ratio (ref 0.0–4.4)
Cholesterol, Total: 154 mg/dL (ref 100–199)
HDL: 53 mg/dL (ref >39)
LDL Chol Calc (NIH): 80 mg/dL (ref 0–99)
Triglycerides: 118 mg/dL (ref 0–149)
VLDL Cholesterol Cal: 21 mg/dL (ref 5–40)

## 2024-03-18 LAB — CMP14+EGFR
ALT: 16 IU/L (ref 0–32)
AST: 18 IU/L (ref 0–40)
Albumin: 4.1 g/dL (ref 3.9–4.9)
Alkaline Phosphatase: 153 IU/L — ABNORMAL HIGH (ref 44–121)
BUN/Creatinine Ratio: 10 (ref 9–23)
BUN: 8 mg/dL (ref 6–24)
Bilirubin Total: 0.5 mg/dL (ref 0.0–1.2)
CO2: 23 mmol/L (ref 20–29)
Calcium: 9.1 mg/dL (ref 8.7–10.2)
Chloride: 99 mmol/L (ref 96–106)
Creatinine, Ser: 0.84 mg/dL (ref 0.57–1.00)
Globulin, Total: 3.2 g/dL (ref 1.5–4.5)
Glucose: 81 mg/dL (ref 70–99)
Potassium: 4.2 mmol/L (ref 3.5–5.2)
Sodium: 139 mmol/L (ref 134–144)
Total Protein: 7.3 g/dL (ref 6.0–8.5)
eGFR: 85 mL/min/{1.73_m2} (ref >59)

## 2024-03-18 NOTE — Assessment & Plan Note (Signed)
 New problem Mildly elevated Alkaline Phosphatase No prior history of elevated Alk Phos No other LFT changes.  No renal disease.  No acute bone pains.   Most likely idiopathic, transient Alk Phos elevation.  Recommend repeat Comprehensive Metabolic Panel in 3 months with GGT

## 2024-03-18 NOTE — Assessment & Plan Note (Addendum)
 Established problem Intermittent sharp pains over areas of spider veins on calves bilaterally  Exam Multiple spider veins over calf areas bilaterally. No overlying skin breakdown nor erythema/calor Trace to 1+ bil ankle edema without evidence of stasis eczema.   Will continue Amlodipine  for now, but should there be progression of edema, then would reduce dose.  Discussed use of compressive therapy.  Could start with OTC support stockings, eg, Sockwells If insufficient to help with discomfort, would recommend custom graduated stockings and/or consultation with vascular specialist.

## 2024-03-18 NOTE — Assessment & Plan Note (Signed)
 Lab Results  Component Value Date   HGBA1C 6.3 03/17/2024   HGBA1C 6.3 (A) 12/25/2022   HGBA1C 6.0 (H) 09/17/2020   Remains in the prediabetes range.  Lifestyle changes.

## 2024-03-18 NOTE — Assessment & Plan Note (Addendum)
 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  Cor: RRR, no M/R;  Lung: BCTA  Ext: trace to 1+ ankle edema bilat  ASCVD 10-yr risk 1.3%  EKG my interpretation: NSR with new Q in lead 1 and aVL c.f  04/19/23 EKG: Formal EKG interpretation NSR and low voltage.  No concern about leads, I, aVL  A/ Most likely diagnosis is tender points of fibromyalgia, though the new Q waves on my interpretation are not explained.   P/ CT coronary score ordered given low risk CAD and atypical presentation.  Further considerations based on CT results.

## 2024-03-18 NOTE — Assessment & Plan Note (Signed)
 Established problem Uncontrolled.  Patient is not at goal of adequate pain control. Localized pain locations in bilateral, symmetric distribution along superior para-sternum, anterolateral arms, calves, and back Aching, variable intensity, does not interfere with ability to perform daily activities and work.  History of taking Flexeril  as needed when pain is interfering with sleep.  It was somewhat helpful.  Carla Berry has tried massage/chiropractery for these pains.  Both interventions resulted in increase in her pains.   PMH: Diagnosis of fibromyalgia first appears in Carla Berry' medical chart in 2005.   Physical reveals tender points in bilateral, symmetric regions noted in HPI.   Start cyclobenzaprine  10 mg tablet, one tablet at night if needed for pains interfering with sleep Referral to physical therapy for consideration of soft tissue release therapies and instruction in exercises for tender points.

## 2024-03-18 NOTE — Assessment & Plan Note (Signed)
 Vitals:   03/17/24 1016 03/17/24 1106  BP: (!) 139/98 (!) 132/92  Pulse: 82   SpO2: 99%    Established problem Well Controlled. Patient is at goal of SBP < 140. Less concerned with DBP elevation given patient's age.  No signs of complications, medication side effects, or red flags. Continue current medications and other regiments.

## 2024-03-21 ENCOUNTER — Ambulatory Visit: Payer: Self-pay | Admitting: Family Medicine

## 2024-03-21 DIAGNOSIS — R0789 Other chest pain: Secondary | ICD-10-CM

## 2024-03-21 DIAGNOSIS — R9431 Abnormal electrocardiogram [ECG] [EKG]: Secondary | ICD-10-CM

## 2024-03-24 ENCOUNTER — Other Ambulatory Visit

## 2024-04-01 ENCOUNTER — Encounter: Payer: Self-pay | Admitting: Cardiovascular Disease

## 2024-04-01 ENCOUNTER — Ambulatory Visit: Attending: Cardiovascular Disease | Admitting: Cardiovascular Disease

## 2024-04-01 VITALS — BP 120/90 | HR 64 | Ht 63.5 in | Wt 269.2 lb

## 2024-04-01 DIAGNOSIS — K30 Functional dyspepsia: Secondary | ICD-10-CM

## 2024-04-01 DIAGNOSIS — M797 Fibromyalgia: Secondary | ICD-10-CM

## 2024-04-01 DIAGNOSIS — I10 Essential (primary) hypertension: Secondary | ICD-10-CM | POA: Diagnosis not present

## 2024-04-01 DIAGNOSIS — R0789 Other chest pain: Secondary | ICD-10-CM

## 2024-04-01 NOTE — Patient Instructions (Addendum)
Medication Instructions:  No changes  If you need a refill on your cardiac medications before your next appointment, please call your pharmacy.   Lab work: No new labs needed  Testing/Procedures: CT coronary calcium score.   - $99 out of pocket cost at the time of your test - Call [phone] to schedule at your convenience.  [facility] [address] , [place] [address]   Coronary CalciumScan A coronary calcium scan is an imaging test used to look for deposits of calcium and other fatty materials (plaques) in the inner lining of the blood vessels of the heart (coronary arteries). These deposits of calcium and plaques can partly clog and narrow the coronary arteries without producing any symptoms or warning signs. This puts a person at risk for a heart attack. This test can detect these deposits before symptoms develop. Tell a health care provider about: Any allergies you have. All medicines you are taking, including vitamins, herbs, eye drops, creams, and over-the-counter medicines. Any problems you or family members have had with anesthetic medicines. Any blood disorders you have. Any surgeries you have had. Any medical conditions you have. Whether you are pregnant or may be pregnant. What are the risks? Generally, this is a safe procedure. However, problems may occur, including: Harm to a pregnant woman and her unborn baby. This test involves the use of radiation. Radiation exposure can be dangerous to a pregnant woman and her unborn baby. If you are pregnant, you generally should not have this procedure done. Slight increase in the risk of cancer. This is because of the radiation involved in the test. What happens before the procedure? No preparation is needed for this procedure. What happens during the procedure? You will undress and remove any jewelry around your neck or chest. You will put on a hospital gown. Sticky  electrodes will be placed on your chest. The electrodes will be connected to an electrocardiogram (ECG) machine to record a tracing of the electrical activity of your heart. A CT scanner will take pictures of your heart. During this time, you will be asked to lie still and hold your breath for 2-3 seconds while a picture of your heart is being taken. The procedure may vary among health care providers and hospitals. What happens after the procedure? You can get dressed. You can return to your normal activities. It is up to you to get the results of your test. Ask your health care provider, or the department that is doing the test, when your results will be ready. Summary A coronary calcium scan is an imaging test used to look for deposits of calcium and other fatty materials (plaques) in the inner lining of the blood vessels of the heart (coronary arteries). Generally, this is a safe procedure. Tell your health care provider if you are pregnant or may be pregnant. No preparation is needed for this procedure. A CT scanner will take pictures of your heart. You can return to your normal activities after the scan is done. This information is not intended to replace advice given to you by your health care provider. Make sure you discuss any questions you have with your health care provider. Document Released: 03/20/2008 Document Revised: 08/11/2016 Document Reviewed: 08/11/2016 Elsevier Interactive Patient Education  2017 Elsevier Inc.  Follow-Up: At [facility], you and your health needs are our priority.  As part of our continuing mission to provide you with exceptional heart care, we have created designated Provider Care   Teams.  These Care Teams include your primary Cardiologist (physician) and Advanced Practice Providers (APPs -  Physician Assistants and Nurse Practitioners) who all work together to provide you with the care you need, when you need it.  You will need a follow up appointment as  needed  Providers on your designated Care Team:   Christopher Berge, NP Ryan Dunn, PA-C Cadence Furth, PA-C  COVID-19 Vaccine Information can be found at: [email or web address] For questions related to vaccine distribution or appointments, please email [email or web address] or call [phone].   

## 2024-04-01 NOTE — Progress Notes (Signed)
 Cardiology Office Note  Date:  04/01/2024   ID:  Carla, Berry 1971/02/04, MRN [id number]  PCP:  McDiarmid, Krystal BIRCH, MD   Chief Complaint  Patient presents with   New Patient (Initial Visit)    Ref by Dr. McDiarmid for Atypical chest pain and abnormal EKG. Patient c/o shortness of breath and chest discomfort/heaviness.     HPI:  [name] is a 53 year old woman with past medical history of Former smoker, quit 2004, age 43 Chest pain/fibromyalgia diagnosed 2005 Prediabetes Covid in 2022, aug 2024, long cough Who presents by referral from Dr. Krystal McDiarmid for consultation of her atypical chest pain, palpitations  Reports having chest pain that has been recurrent over months, lasting for hours at a time, feels aching, burning Dyspnea on exertion Takes omeprazole for GERD Symptoms felt secondary to fibromyalgia  Calcium score ordered but not completed Prior stress test 2013 low risk for CAD  On further discussion today she reports symptoms in her chest come and go, Typically when she has knots in her chest, they are tender Massage tends to make it worse - Reports that she is active, denies significant chest pain or shortness of breath on exertion, able to do what she wants to do - Takes care of her house, all her ADLs  EKG personally reviewed by myself on todays visit EKG Interpretation Date/Time:  Friday April 01 2024 10:08:05 EDT Ventricular Rate:  64 PR Interval:  198 QRS Duration:  82 QT Interval:  412 QTC Calculation: 425 R Axis:   10  Text Interpretation: Normal sinus rhythm Normal ECG When compared with ECG of 17-Mar-2024 11:25, Left posterior fascicular block is no longer Present T wave inversion no longer evident in Lateral leads Confirmed by Perla Lye [phone]) on 04/01/2024 10:17:16 AM    PMH:   has a past medical history of Abdominal pain (11/26/2017), Chest tightness (07/15/2019), Chest wall pain (09/04/2020), Chronic cough (10/12/2015),  Colitis (03/02/2023), Depression (09/19/2020), Diastolic hypertension (11/06/2015), FIBROIDS, UTERUS (02/08/2007), Fibromyalgia (03/18/2024), Ganglion of right wrist (04/04/2016), Gastric polyp (07/23/2012), Gastritis, acute (07/16/2012), GASTROESOPHAGEAL REFLUX, NO ESOPHAGITIS (12/03/2006), Glucose intolerance (12/03/2006), Hair loss (04/04/2016), Heavy menstrual bleeding (05/21/2018), History of nephrolithiasis (12/03/2006), hypokalemia (12/16/2018), MIGRAINE, UNSPEC., W/O INTRACTABLE MIGRAINE (12/03/2006), Myalgia, Chronic, Nipple discharge (12/26/2022), PALPITATIONS, RECURRENT (07/31/2008), PAPANICOLAOU SMEAR, ABNORMAL (12/03/2006), Plantar fasciitis, right (03/02/2023), Sinusitis (10/27/2023), Skin lesion of chest wall (11/17/2014), Snoring (04/03/2017), Suprapubic pain (11/30/2014), TOBACCO USE, QUIT (12/11/2009), Urinary incontinence, mixed, 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  PSH:    Past Surgical History:  Procedure Laterality Date   BREAST BIOPSY Right 02/17/2023   US  RT BREAST BX W LOC DEV 1ST LESION IMG BX SPEC US  GUIDE 02/17/2023 [facility]   BREAST BIOPSY  04/20/2023   MM RT RADIOACTIVE SEED LOC MAMMO GUIDE 04/20/2023 [facility]   BREAST BIOPSY Right 01/05/2024   US  RT BREAST BX W LOC DEV 1ST LESION IMG BX SPEC US  GUIDE 01/05/2024 [facility]   BREAST DUCTAL SYSTEM EXCISION Right 04/21/2023   Procedure: EXCISION DUCTAL SYSTEM BREAST;  Surgeon: Ebbie Cough, MD;  Location: [facility];  Service: General;  Laterality: Right;   CESAREAN SECTION     x 2   COLONOSCOPY N/A 02/08/2013   Procedure: COLONOSCOPY;  Surgeon: Belvie BIRCH Just, MD;  Location: [facility];  Service: Endoscopy;  Laterality: N/A;   RADIOACTIVE SEED GUIDED EXCISIONAL BREAST BIOPSY Right 04/21/2023   Procedure: RADIOACTIVE SEED  GUIDED EXCISIONAL RIGHT BREAST BIOPSY;  Surgeon:  Ebbie Cough, MD;  Location: [facility];  Service: General;  Laterality: Right;   TONSILLECTOMY     TUBAL LIGATION     VULVECTOMY PARTIAL  08/2004   For VIN-II    Current Outpatient Medications  Medication Sig Dispense Refill   amLODipine  (NORVASC ) 10 MG tablet TAKE 1 TABLET BY MOUTH EVERY DAY 90 tablet 0   cetirizine  (ZYRTEC ) 10 MG tablet Take 10 mg by mouth daily.     cyclobenzaprine  (FLEXERIL ) 5 MG tablet Take 1 tablet (5 mg total) by mouth at bedtime as needed for muscle spasms. 30 tablet 2   esomeprazole  (NEXIUM ) 40 MG capsule TAKE 1 CAPSULE (40 MG TOTAL) BY MOUTH DAILY. 90 capsule 3   triamterene -hydrochlorothiazide  (MAXZIDE-25) 37.5-25 MG tablet TAKE 1/2 TABLET BY MOUTH DAILY 45 tablet 3   No current facility-administered medications for this visit.    Allergies:   Codeine, Prednisone, and Pregabalin    Social History:  The patient  reports that she quit smoking about 20 years ago. Her smoking use included cigarettes. She started smoking about 36 years ago. She has a 15.7 pack-year smoking history. She has never used smokeless tobacco. She reports that she does not drink alcohol and does not use drugs.   Family History:   family history includes Diabetes in her mother; Early death in her father; Factor V Leiden deficiency in her maternal grandmother; Hypertension in her mother; Nephrolithiasis in her mother; Stroke in her maternal grandfather and mother.    Review of Systems: Review of Systems  Constitutional: Negative.   HENT: Negative.    Respiratory: Negative.    Cardiovascular: Negative.        Chest tenderness on palpation  Gastrointestinal: Negative.   Musculoskeletal: Negative.   Neurological: Negative.   Psychiatric/Behavioral: Negative.    All other systems reviewed and are negative.   PHYSICAL EXAM: VS:  BP (!) 120/90 (BP Location: Right Arm, Patient Position: Sitting, Cuff Size: Large)   Pulse 64   Ht 5' 3.5 (1.613 m)   Wt 269 lb 4 oz  (122.1 kg)   LMP 04/16/2023   SpO2 98%   BMI 46.95 kg/m  , BMI Body mass index is 46.95 kg/m. GEN: Well nourished, well developed, in no acute distress HEENT: normal Neck: no JVD, carotid bruits, or masses Cardiac: RRR; no murmurs, rubs, or gallops,no edema  Respiratory:  clear to auscultation bilaterally, normal work of breathing GI: soft, nontender, nondistended, + BS MS: no deformity or atrophy Skin: warm and dry, no rash Neuro:  Strength and sensation are intact Psych: euthymic mood, full affect  Recent Labs: 08/12/2023: Hemoglobin 13.3; Platelets 317 03/17/2024: ALT 16; BUN 8; Creatinine, Ser 0.84; Potassium 4.2; Sodium 139    Lipid Panel Lab Results  Component Value Date   CHOL 154 03/17/2024   HDL 53 03/17/2024   LDLCALC 80 03/17/2024   TRIG 118 03/17/2024      Wt Readings from Last 3 Encounters:  04/01/24 269 lb 4 oz (122.1 kg)  03/17/24 268 lb 12.8 oz (121.9 kg)  10/27/23 250 lb (113.4 kg)       ASSESSMENT AND PLAN:  Problem List Items Addressed This Visit       Cardiology Problems   Essential hypertension   Relevant Orders   EKG 12-Lead (Completed)     Other   Fibromyalgia   Functional dyspepsia   Other Visit Diagnoses       Atypical chest pain    -  Primary   Relevant Orders   EKG 12-Lead (Completed)      Chest pain Atypical in nature, likely exacerbated by fibromyalgia Recommended risk stratification study with coronary calcium scoring This will be ordered through [facility], $99 (she was previously quoted $150 in [place]) Cardiac risk factors Most recent cholesterol relatively well-controlled on no medication Nondiabetic, not an active smoker  Essential hypertension Blood pressure is reasonable with borderline elevated diastolic pressure, recommend she monitor pressure at home. No changes made to the medications.  Fibromyalgia Recommend massage, walking program for conditioning Low carbohydrate diet  Prediabetes A1c 6.3 We have  encouraged exercise, careful diet management   Patient seen in consultation for Dr. McDiarmid and will be referred back to his office for ongoing care of the issues detailed above  Signed, Velinda Lunger, M.D., Ph.D. [facility] [place], [place] [phone]

## 2024-04-04 ENCOUNTER — Ambulatory Visit
Admission: RE | Admit: 2024-04-04 | Discharge: 2024-04-04 | Disposition: A | Discharge status: Home / Self Care | Payer: Self-pay | Source: Ambulatory Visit | Attending: Cardiovascular Disease | Admitting: Cardiovascular Disease

## 2024-04-04 DIAGNOSIS — R0789 Other chest pain: Secondary | ICD-10-CM | POA: Insufficient documentation

## 2024-04-05 ENCOUNTER — Ambulatory Visit (INDEPENDENT_AMBULATORY_CARE_PROVIDER_SITE_OTHER): Payer: Self-pay | Admitting: Pharmacist

## 2024-04-05 ENCOUNTER — Encounter: Payer: Self-pay | Admitting: Pharmacist

## 2024-04-05 VITALS — BP 117/78 | HR 80 | Ht 64.0 in | Wt 269.0 lb

## 2024-04-05 DIAGNOSIS — R0602 Shortness of breath: Secondary | ICD-10-CM

## 2024-04-05 NOTE — Progress Notes (Signed)
   S:     Chief Complaint  Patient presents with   Medication Management    PFT - Spirometry   53 y.o. female who presents for respiratory evaluation, education, and management. Patient arrives in good spirits and presents without any assistance.   Patient was referred and last seen by Primary Care Provider, Dr. McDiarmid, on 6/12/025.  At last visit, concern for prolonged shortness of breath resulted in plan for repeat PFT.   PMH is significant for COVID - multiple.   Patient reports breathing has been problematic since COVID and patient feels that breathing has not recovered since second episodes of COVID. Patient reports atopic sx consistent with (urticaria, allergic rhinitis).  Patient reports adherence to medications Patient reports last dose of asthma medications was in February (use from grand-daughter) Current asthma medications: None Rescue inhaler use frequency: Minimal  Level of asthma sx control- in the last 4 weeks: Question Scoring Patient Score  Daytime sx > 2x/week Yes (1)   No (0) 1  Any nighttime waking due to asthma Yes (1)   No (0) 0  Reliever needed >2x/week Yes (1)   No (0) 0  Any activity limitation due to asthma Yes (1)   No (0) 1   Total Score   Well controlled - 0, Partly controlled - 1-2, Uncontrolled 3-4 Limitation to ability to walk as much as PRE-COVID  O: Review of Systems  Respiratory:  Positive for cough, sputum production (Every morning - orange) and shortness of breath.   All other systems reviewed and are negative.   Physical Exam Vitals reviewed.  Constitutional:      Appearance: Normal appearance.  Pulmonary:     Effort: Pulmonary effort is normal.   Neurological:     Mental Status: She is alert.   Psychiatric:        Mood and Affect: Mood normal.        Behavior: Behavior normal.        Thought Content: Thought content normal.        Judgment: Judgment normal.    Vitals:   04/05/24 1420  BP: 117/78  Pulse: 80  SpO2: 99%     See scanned report or Documentation Flowsheet (discrete results - PFTs) for  Spirometry results. Patient provided good effort while attempting spirometry.  Lung Age = 72  Albuterol  Neb  Lot# [id number]     Exp. 03/05/2025 Spirometry results were ~ 10-12% less than those completed in 2016   A/P: Patient has been experiencing chronic, dry cough and shortness of breath post multiple respiratory infections (2 episodes of COVID and 1 RSV potentially) over the last several years. Currently not taking any medication for treatment of shortness of breath or cough.  Spirometry evaluation with pre- and post-bronchodilator reveals Possible Restriction with no reversibility post-albuterol  nebulization.   - No change in treatment at this time - Defer to Dr. McDiarmid on medication trial versus pulmonology referral as next potential steps.  - Patient will await instruction from Dr. McDiarmid as to what should be done next.   Reviewed results of pulmonary function tests.  Patient verbalized understanding of treatment plan.  Written patient instructions provided.  Total time in face to face counseling 48 minutes.    Follow-up:  Pharmacist PRN

## 2024-04-05 NOTE — Patient Instructions (Signed)
 It was nice to see you today!    Your lung function test showed about 70% predicted of your normal/goal values.   Medication Changes: No medication changes today.  Continue all other medication the same.

## 2024-04-05 NOTE — Assessment & Plan Note (Signed)
 Patient has been experiencing chronic, dry cough and shortness of breath post multiple respiratory infections (2 episodes of COVID and 1 RSV potentially) over the last several years. Currently not taking any medication for treatment of shortness of breath or cough.  Spirometry evaluation with pre- and post-bronchodilator reveals Possible Restriction with no reversibility post-albuterol  nebulization.   - No change in treatment at this time - Defer to Dr. McDiarmid on medication trial versus pulmonology referral as next potential steps.  - Patient will await instruction from Dr. McDiarmid as to what should be done next.

## 2024-04-06 ENCOUNTER — Other Ambulatory Visit: Payer: Self-pay | Admitting: Family Medicine

## 2024-04-06 DIAGNOSIS — Z79899 Other long term (current) drug therapy: Secondary | ICD-10-CM

## 2024-04-10 ENCOUNTER — Ambulatory Visit: Payer: Self-pay | Admitting: Cardiovascular Disease

## 2024-04-13 DIAGNOSIS — F4310 Post-traumatic stress disorder, unspecified: Secondary | ICD-10-CM | POA: Diagnosis not present

## 2024-05-10 DIAGNOSIS — F4310 Post-traumatic stress disorder, unspecified: Secondary | ICD-10-CM | POA: Diagnosis not present

## 2024-05-28 ENCOUNTER — Other Ambulatory Visit: Payer: Self-pay | Admitting: Family Medicine

## 2024-05-28 DIAGNOSIS — K219 Gastro-esophageal reflux disease without esophagitis: Secondary | ICD-10-CM

## 2024-05-30 ENCOUNTER — Encounter: Payer: Self-pay | Admitting: Family Medicine

## 2024-05-30 DIAGNOSIS — R053 Chronic cough: Secondary | ICD-10-CM

## 2024-06-21 DIAGNOSIS — F4310 Post-traumatic stress disorder, unspecified: Secondary | ICD-10-CM | POA: Diagnosis not present

## 2024-06-25 ENCOUNTER — Other Ambulatory Visit: Payer: Self-pay | Admitting: Family Medicine

## 2024-06-25 DIAGNOSIS — M791 Myalgia, unspecified site: Secondary | ICD-10-CM

## 2024-07-06 ENCOUNTER — Ambulatory Visit (INDEPENDENT_AMBULATORY_CARE_PROVIDER_SITE_OTHER): Admitting: Pulmonary Disease

## 2024-07-06 ENCOUNTER — Encounter: Payer: Self-pay | Admitting: Pulmonary Disease

## 2024-07-06 VITALS — BP 124/86 | HR 96 | Temp 98.0°F | Ht 64.0 in | Wt 268.0 lb

## 2024-07-06 DIAGNOSIS — K219 Gastro-esophageal reflux disease without esophagitis: Secondary | ICD-10-CM | POA: Diagnosis not present

## 2024-07-06 DIAGNOSIS — R0683 Snoring: Secondary | ICD-10-CM

## 2024-07-06 DIAGNOSIS — R0602 Shortness of breath: Secondary | ICD-10-CM | POA: Diagnosis not present

## 2024-07-06 DIAGNOSIS — R053 Chronic cough: Secondary | ICD-10-CM

## 2024-07-06 LAB — NITRIC OXIDE: Nitric Oxide: 15

## 2024-07-06 MED ORDER — AIRSUPRA 90-80 MCG/ACT IN AERO: 2.0000 | INHALATION_SPRAY | Freq: Four times a day (QID) | RESPIRATORY_TRACT | 3 refills | Status: AC | PRN

## 2024-07-06 NOTE — Progress Notes (Signed)
 Subjective:    Patient ID: Carla Berry, female    DOB: 1970-10-22, 53 y.o.   MRN: [id number]  Patient Care Team: McDiarmid, Krystal BIRCH, MD as PCP - General Kristie Lamprey, MD as Consulting Physician (Gastroenterology) Gaston Hamilton, MD as Consulting Physician (Urology)  Chief Complaint  Patient presents with   Consult    Cough with occasional yellow/brown phlegm. Shortness of breath on exertion.     BACKGROUND: Patient is a 53 year old former smoker with a 15-pack-year history of smoking, and a history as noted below who presents for evaluation of shortness of breath and cough productive of yellowish to brown sputum for approximately 3 years duration.  She is kindly referred by Dr. Krystal McDiarmid.   HPI Discussed the use of AI scribe software for clinical note transcription with the patient, who gave verbal consent to proceed.  History of Present Illness   Carla Berry is a 53 year old female who presents with chronic cough and shortness of breath.  She has experienced a persistent cough and shortness of breath for approximately three years, initially following a COVID-19 infection in June 2022. The cough persisted, and shortness of breath worsened after a second COVID-19 infection in August 2024. She reports having RSV in January after her granddaughter was ill.  The cough is productive with mucus present in her throat every morning. It occurs spontaneously and worsens with colds. Spirometry was performed on 05 April 2024 at her primary physician's office showing no change pre and post bronchodilator. She was informed that her lung function was comparable to that of a 53 year old.  Her past medical history includes smoking, which she quit in 2004 after smoking half a pack a day since a young age. She also experiences reflux, for which she takes Nexium  40 mg, and fibromyalgia, which causes significant pain. She has been prescribed a muscle relaxer for fibromyalgia but is  hesitant to use it regularly due to concerns about potential emergencies.  In the review of symptoms, she reports occasional leg swelling, which she attributes to blood pressure medication (amlodipine ), and difficulty falling asleep due to racing thoughts when she goes to bed. Her daughter has mentioned that she snores, but she has not personally noticed it. Her weight has remained stable despite efforts to lose it.     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  And has no occupational exposures.  She lives with her daughter in [place].  DATA 09/20/2015 spirometry: FEV1 2.31 L or 82% predicted, FVC 2.84 L or 85% predicted, FEV1/FVC 81%.  FEF 25-75 2.41 l/s or 75% predicted.  No evidence of obstruction. 01/03/2024 cardiac CT: Coronary calcium score of 0.  Lung windows showed no abnormalities on the imaged tracheobronchial tree.  Mild dependent changes in the bilateral lungs.  No mass or consolidation, no pleural effusion or pneumothorax.  No suspicious lung nodules. 04/05/2024 spirometry pre and post: FEV1 2.0 L or 71% predicted, FVC 2.46 L or 70 % predicted, FEV1/FVC 81%.  There was no significant bronchodilator response.  Restriction suggested.   Review of Systems A 10 point review of systems was performed and it is as noted above otherwise negative.   Past Medical History:  Diagnosis Date   Abdominal pain 11/26/2017   Chest tightness 07/15/2019   Chest wall pain 09/04/2020   Chronic cough 10/12/2015   Colitis 03/02/2023   Depression 09/19/2020   Diastolic hypertension 11/06/2015   FIBROIDS, UTERUS 02/08/2007   Qualifier: Diagnosis of  By: McDiarmid MD, Todd     Fibromyalgia 03/18/2024   Ganglion of right wrist 04/04/2016   Gastric polyp 07/23/2012   Pathology: Chronic inactive gastritis.  EGD by Dr DOROTHA Sous (GI)   Gastritis, acute 07/16/2012   Dr Sous (GI) ordered abdominal US  that was normal (07/13/12).  Dr Sous ordered Nuc Med Hepatobiliary Tc99 scan  with cholecystikinin stimulation which was normal on 07/13/12.  Upper Endoscopy by Dr Sous on 07/21/12 showed mild antral gastritis and sessile gastric polyps that were benign on histopathology     GASTROESOPHAGEAL REFLUX, NO ESOPHAGITIS 12/03/2006   Qualifier: Diagnosis of  By: Damien Folks     Glucose intolerance 12/03/2006   Qualifier: Diagnosis of  By: McDiarmid MD, Todd     Hair loss 04/04/2016   Heavy menstrual bleeding 05/21/2018   History of nephrolithiasis 12/03/2006   Qualifier: History of  By: McDiarmid MD, Todd     Hx of hypokalemia 12/16/2018   MIGRAINE, UNSPEC., W/O INTRACTABLE MIGRAINE 12/03/2006   Qualifier: Diagnosis of  By: Damien Folks     Myalgia, Chronic    Nipple discharge 12/26/2022   PALPITATIONS, RECURRENT 07/31/2008   Qualifier: History of  By: McDiarmid MD, Krystal GLOW SMEAR, ABNORMAL 12/03/2006   Qualifier: History of By: McDiarmid MD, Todd     Plantar fasciitis, right 03/02/2023   Sinusitis 10/27/2023   Skin lesion of chest wall 11/17/2014   Snoring 04/03/2017   Suprapubic pain 11/30/2014   TOBACCO USE, QUIT 12/11/2009   Qualifier: Diagnosis of  By: Amalia Berdine JONETTA Maude     Urinary incontinence, mixed    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   Qualifier: History of  By: McDiarmid MD, Krystal      Past Surgical History:  Procedure Laterality Date   BREAST BIOPSY Right 02/17/2023   US  RT BREAST BX W LOC DEV 1ST LESION IMG BX SPEC US  GUIDE 02/17/2023 [facility]   BREAST BIOPSY  04/20/2023   MM RT RADIOACTIVE SEED LOC MAMMO GUIDE 04/20/2023 [facility]   BREAST BIOPSY Right 01/05/2024   US  RT BREAST BX W LOC DEV 1ST LESION IMG BX SPEC US  GUIDE 01/05/2024 [facility]   BREAST DUCTAL SYSTEM EXCISION Right 04/21/2023   Procedure: EXCISION DUCTAL SYSTEM BREAST;  Surgeon: Ebbie Cough, MD;  Location: [facility];  Service: General;  Laterality: Right;   CESAREAN SECTION     x 2   COLONOSCOPY N/A 02/08/2013   Procedure: COLONOSCOPY;  Surgeon: Belvie JONETTA Just, MD;  Location[facility];  Service: Endoscopy;  Laterality: N/A;   RADIOACTIVE SEED GUIDED EXCISIONAL BREAST BIOPSY Right 04/21/2023   Procedure: RADIOACTIVE SEED GUIDED EXCISIONAL RIGHT BREAST BIOPSY;  Surgeon: Ebbie Cough, MD;  Location: [facility];  Service: General;  Laterality: Right;   TONSILLECTOMY     TUBAL LIGATION     VULVECTOMY PARTIAL  08/2004   For VIN-II    Patient Active Problem List   Diagnosis Date Noted   Shortness of breath 04/05/2024   Lipedema of lower extremity 03/18/2024   Fibromyalgia 03/18/2024   Venous insufficiency of both lower extremities 03/18/2024   Varicose veins of both legs with edema 03/18/2024   Prediabetes 03/18/2024   Elevated serum alkaline phosphatase level 03/18/2024   Fatty liver 03/02/2023   Family history of colonic polyps 03/02/2023   Plantar fasciitis, right 03/02/2023   Essential hypertension  08/10/2020   Iron deficiency 06/15/2018   Urinary incontinence, mixed    Functional dyspepsia 10/12/2015   Personal history of vulvar dysplasia 11/30/2014   Metabolic syndrome 11/17/2014   Other chest pain 09/04/2010   Severe obesity (BMI >= 40) (HCC) 12/03/2006   RHINITIS, ALLERGIC 12/03/2006   GASTROESOPHAGEAL REFLUX, NO ESOPHAGITIS 12/03/2006    Family History  Problem Relation Age of Onset   Hypertension Mother    Diabetes Mother    Nephrolithiasis Mother    Stroke Mother    Early death Father        ?Encephalitis   Factor V Leiden deficiency Maternal Grandmother    Stroke Maternal Grandfather    BRCA 1/2 Neg Hx    Breast cancer Neg Hx     Social History   Tobacco Use   Smoking status: Former    Current packs/day: 0.00    Average packs/day: 1 pack/day for 15.7 years (15.7 ttl pk-yrs)    Types: Cigarettes    Start date: 10/07/1987    Quit date:  07/07/2003    Years since quitting: 21.0   Smokeless tobacco: Never  Substance Use Topics   Alcohol use: No    Alcohol/week: 0.0 standard drinks of alcohol    Allergies  Allergen Reactions   Codeine Nausea And Vomiting and Other (See Comments)    Causes Dizziness, passes out    Prednisone Nausea And Vomiting    Severe nausea and vomiting for days     Pregabalin  Other (See Comments)    Peripheral edema and sedation    Current Meds  Medication Sig   Albuterol -Budesonide (AIRSUPRA) 90-80 MCG/ACT AERO Inhale 2 puffs into the lungs every 6 (six) hours as needed.   amLODipine  (NORVASC ) 10 MG tablet TAKE 1 TABLET BY MOUTH EVERY DAY   cetirizine  (ZYRTEC ) 10 MG tablet Take 10 mg by mouth daily.   cyclobenzaprine  (FLEXERIL ) 5 MG tablet TAKE 1 TABLET BY MOUTH AT BEDTIME AS NEEDED FOR MUSCLE SPASMS.   esomeprazole  (NEXIUM ) 40 MG capsule TAKE 1 CAPSULE (40 MG TOTAL) BY MOUTH DAILY.   triamterene -hydrochlorothiazide  (MAXZIDE-25) 37.5-25 MG tablet TAKE 1/2 TABLET BY MOUTH DAILY    Immunization History  Administered Date(s) Administered   Influenza Split 07/01/2012   Influenza Whole 08/04/2007, 06/30/2008, 07/09/2009, 07/12/2010   Influenza,inj,Quad PF,6+ Mos 07/11/2013, 07/13/2014, 10/11/2015, 07/28/2018, 07/25/2019, 08/16/2020, 10/04/2021   Influenza-Unspecified 09/06/2016, 09/07/2017, 07/28/2018   PFIZER(Purple Top)SARS-COV-2 Vaccination 02/04/2020, 03/09/2020, 09/25/2020   Td 03/07/2003   Tdap 04/28/2013, 10/15/2022        Objective:     BP 124/86   Pulse 96   Temp 98 F (36.7 C) (Temporal)   Ht 5' 4 (1.626 m)   Wt 268 lb (121.6 kg)   LMP 04/16/2023   SpO2 98%   BMI 46.00 kg/m   SpO2: 98 %  GENERAL: Obese woman, no acute distress.  Fully ambulatory, no conversational dyspnea. HEAD: Normocephalic, atraumatic.  EYES: Pupils equal, round, reactive to light.  No scleral icterus.  MOUTH: Dentition intact, oral mucosa moist.  No thrush. NECK: Supple. No thyromegaly.  Trachea midline. No JVD.  No adenopathy. PULMONARY: Good air entry bilaterally.  No adventitious sounds. CARDIOVASCULAR: S1 and S2. Regular rate and rhythm.  No rubs, murmurs or gallops heard. ABDOMEN: Obese otherwise benign MUSCULOSKELETAL: No joint deformity, no clubbing, no edema.  NEUROLOGIC: No overt focal deficit, no gait disturbance, speech is fluent. SKIN: Intact,warm,dry. PSYCH: Mood and behavior normal.  Lab Results  Component Value Date   NITRICOXIDE 15 07/06/2024  *  No evidence of significant type II inflammation noted.      07/06/2024    9:57 AM  Results of the Epworth flowsheet  Sitting and reading 0  Watching TV 1  Sitting, inactive in a public place (e.g. a theatre or a meeting) 0  As a passenger in a car for an hour without a break 0  Lying down to rest in the afternoon when circumstances permit 0  Sitting and talking to someone 0  Sitting quietly after a lunch without alcohol 0  In a car, while stopped for a few minutes in traffic 0  Total score 1    Assessment & Plan:     ICD-10-CM   1. Shortness of breath  R06.02 Nitric oxide    Pulmonary function test    2. Chronic cough  R05.3 Pulmonary function test    3. Gastroesophageal reflux disease, unspecified whether esophagitis present  K21.9     4. Snoring  R06.83       Orders Placed This Encounter  Procedures   Nitric oxide   Pulmonary function test    Standing Status:   Future    Expiration Date:   07/06/2025    Where should this test be performed?:   Outpatient Pulmonary    What type of PFT is being ordered?:   Full PFT    Meds ordered this encounter  Medications   Albuterol -Budesonide (AIRSUPRA) 90-80 MCG/ACT AERO    Sig: Inhale 2 puffs into the lungs every 6 (six) hours as needed.    Dispense:  10.7 g    Refill:  3   Discussion:    Chronic cough Persisting for three years, initially following a COVID-19 infection in June 2022. Cough is productive with mucus, particularly in the  mornings. No seasonal variation or specific triggers identified. Previous spirometry did not indicate asthma, but cough variant asthma is considered as a differential diagnosis. Increased weight may contribute to symptoms. - Prescribed inhaler for use during coughing jags or shortness of breath, two puffs up to four times a day. - Recommended plain Mucinex  to help loosen mucus as needed.  Shortness of breath Developed after a second COVID-19 infection in August 2024. Occurs with exertion, such as walking. Previous spirometry did not indicate asthma, but further evaluation is warranted.  Obesity may contribute to symptoms. - Ordered full pulmonary function test to evaluate lung function. - Prescribed Airsupra as trial rescue medication during episodes of shortness of breath/excessive cough.  Gastroesophageal reflux disease (GERD) GERD managed with Nexium  40 mg, which appears to control symptoms.  Fibromyalgia Causing significant pain, contributing to sleep disturbances. She is hesitant to use prescribed muscle relaxers due to concerns about emergency situations. - Epworth scale low. - Reassess sleep disturbance on follow-up and consider sleep study if necessary.  History of tobacco use Smoked half a pack per day, quit in 2004. Smoking history may contribute to current respiratory symptoms.    Advised if symptoms do not improve or worsen, to please contact office for sooner follow up or seek emergency care.    I spent 60 minutes of dedicated to the care of this patient on the date of this encounter to include pre-visit review of records, face-to-face time with the patient discussing conditions above, post visit ordering of testing, clinical documentation with the electronic health record, making appropriate referrals as documented, and communicating necessary findings to members of the patients care team.   C. Leita Sanders, MD Advanced Bronchoscopy PCCM [place]  [facility]    *  This note was dictated using voice recognition software/Dragon.  Despite best efforts to proofread, errors can occur which can change the meaning. Any transcriptional errors that result from this process are unintentional and may not be fully corrected at the time of dictation.

## 2024-07-06 NOTE — Patient Instructions (Addendum)
 VISIT SUMMARY:  Today, we discussed your chronic cough and shortness of breath, which have persisted since your COVID-19 infections. We also reviewed your GERD, fibromyalgia, and history of tobacco use. We have made some adjustments to your treatment plan to help manage your symptoms more effectively.  YOUR PLAN:  -CHRONIC COUGH: Your chronic cough has been ongoing for three years, starting after your COVID-19 infection. It is productive with mucus, especially in the mornings. We have prescribed an inhaler to use during coughing episodes or shortness of breath, with two puffs up to four times a day. Additionally, you can take plain Mucinex  to help loosen the mucus as needed.  -SHORTNESS OF BREATH: Your shortness of breath began after your second COVID-19 infection and occurs with exertion. We have ordered full breathing tests to evaluate your lung function further. You should use the prescribed inhaler as a rescue medication during episodes of shortness of breath.  -GASTROESOPHAGEAL REFLUX DISEASE (GERD): Your GERD is being managed with Nexium  40 mg, which appears to control your symptoms. GERD is a condition where stomach acid frequently flows back into the tube connecting your mouth and stomach, causing irritation.  -FIBROMYALGIA: Your fibromyalgia causes significant pain and contributes to sleep disturbances. We discussed the potential use of plain Mucinex  to help with your symptoms. Fibromyalgia is a condition characterized by widespread musculoskeletal pain, fatigue, and tenderness in localized areas.  -HISTORY OF TOBACCO USE: You have a history of smoking half a pack per day but quit in 2004. This history may contribute to your current respiratory symptoms. Smoking can cause long-term damage to your lungs and airways.  INSTRUCTIONS:  Please follow up with the full breathing tests as ordered to evaluate your lung function. Use the prescribed inhaler as directed for both your chronic cough and  shortness of breath. Continue taking Nexium  40 mg for your GERD. Consider using plain Mucinex  to help with mucus and fibromyalgia symptoms. If you have any concerns or your symptoms worsen, please contact our office.

## 2024-07-19 DIAGNOSIS — F4310 Post-traumatic stress disorder, unspecified: Secondary | ICD-10-CM | POA: Diagnosis not present

## 2024-07-25 ENCOUNTER — Other Ambulatory Visit: Payer: Self-pay | Admitting: Medical Genetics

## 2024-07-25 DIAGNOSIS — Z006 Encounter for examination for normal comparison and control in clinical research program: Secondary | ICD-10-CM

## 2024-07-29 DIAGNOSIS — Z01419 Encounter for gynecological examination (general) (routine) without abnormal findings: Secondary | ICD-10-CM | POA: Diagnosis not present

## 2024-07-29 DIAGNOSIS — Z8544 Personal history of malignant neoplasm of other female genital organs: Secondary | ICD-10-CM | POA: Diagnosis not present

## 2024-07-29 HISTORY — PX: COLPOSCOPY VULVA: SUR281

## 2024-08-01 ENCOUNTER — Encounter: Payer: Self-pay | Admitting: Family Medicine

## 2024-08-01 DIAGNOSIS — R87810 Cervical high risk human papillomavirus (HPV) DNA test positive: Secondary | ICD-10-CM | POA: Diagnosis not present

## 2024-08-16 ENCOUNTER — Encounter: Payer: Self-pay | Admitting: Pulmonary Disease

## 2024-08-16 ENCOUNTER — Ambulatory Visit (INDEPENDENT_AMBULATORY_CARE_PROVIDER_SITE_OTHER)

## 2024-08-16 ENCOUNTER — Ambulatory Visit: Admitting: Pulmonary Disease

## 2024-08-16 VITALS — BP 110/60 | HR 83 | Temp 97.7°F | Ht 64.0 in | Wt 269.2 lb

## 2024-08-16 DIAGNOSIS — F4310 Post-traumatic stress disorder, unspecified: Secondary | ICD-10-CM | POA: Diagnosis not present

## 2024-08-16 DIAGNOSIS — K219 Gastro-esophageal reflux disease without esophagitis: Secondary | ICD-10-CM | POA: Diagnosis not present

## 2024-08-16 DIAGNOSIS — R053 Chronic cough: Secondary | ICD-10-CM | POA: Diagnosis not present

## 2024-08-16 DIAGNOSIS — J45991 Cough variant asthma: Secondary | ICD-10-CM

## 2024-08-16 DIAGNOSIS — J383 Other diseases of vocal cords: Secondary | ICD-10-CM | POA: Diagnosis not present

## 2024-08-16 DIAGNOSIS — R0602 Shortness of breath: Secondary | ICD-10-CM | POA: Diagnosis not present

## 2024-08-16 LAB — PULMONARY FUNCTION TEST
DL/VA % pred: 122 %
DL/VA: 5.27 ml/min/mmHg/L
DLCO unc % pred: 93 %
DLCO unc: 19.54 ml/min/mmHg
FEF 25-75 Post: 2.94 L/s
FEF 25-75 Pre: 2.52 L/s
FEF2575-%Change-Post: 16 %
FEF2575-%Pred-Post: 107 %
FEF2575-%Pred-Pre: 92 %
FEV1-%Change-Post: 4 %
FEV1-%Pred-Post: 77 %
FEV1-%Pred-Pre: 74 %
FEV1-Post: 2.17 L
FEV1-Pre: 2.07 L
FEV1FVC-%Change-Post: 3 %
FEV1FVC-%Pred-Pre: 104 %
FEV6-%Change-Post: 1 %
FEV6-%Pred-Post: 73 %
FEV6-%Pred-Pre: 72 %
FEV6-Post: 2.5 L
FEV6-Pre: 2.47 L
FEV6FVC-%Pred-Post: 102 %
FEV6FVC-%Pred-Pre: 102 %
FVC-%Change-Post: 1 %
FVC-%Pred-Post: 71 %
FVC-%Pred-Pre: 70 %
FVC-Post: 2.5 L
FVC-Pre: 2.47 L
Post FEV1/FVC ratio: 87 %
Post FEV6/FVC ratio: 100 %
Pre FEV1/FVC ratio: 84 %
Pre FEV6/FVC Ratio: 100 %
RV % pred: 93 %
RV: 1.68 L
TLC % pred: 89 %
TLC: 4.53 L

## 2024-08-16 MED ORDER — ARNUITY ELLIPTA 100 MCG/ACT IN AEPB: 1.0000 | INHALATION_SPRAY | Freq: Every day | RESPIRATORY_TRACT | 6 refills | Status: DC

## 2024-08-16 NOTE — Patient Instructions (Signed)
 VISIT SUMMARY:  Today, you were seen for a persistent cough that has been troubling you, especially in the mornings. We discussed your symptoms, including the mucus production and the unpleasant side effects of your current inhaler. We also reviewed your history of asthma, vocal cord dysfunction, and GERD.  YOUR PLAN:  -CHRONIC COUGH WITH POSTNASAL DRIP AND MORNING MUCUS: Your persistent cough is likely related to sinus issues causing postnasal drip and mucus production in the mornings. We will switch your Zyrtec  to evening dosing to see if it helps reduce your morning symptoms. Additionally, we have ordered a chest x-ray and blood work to further evaluate your symptoms.  -VOCAL CORD DYSFUNCTION: Vocal cord dysfunction means your vocal cords are twitchy and can contribute to your cough. This condition is sometimes associated with asthma.  -ASTHMA, NON-TYPE 2 (NEUTROPHILIC): You have a type of asthma that is more challenging to manage because it does not respond well to typical asthma medications. We will switch you to an Arnuity inhaler, which you should use once daily in the evening. Be sure to rinse your mouth well after using the inhaler to avoid any unpleasant taste.  -GASTROESOPHAGEAL REFLUX DISEASE (GERD): GERD is a condition where stomach acid frequently flows back into the tube connecting your mouth and stomach. Your GERD is well-controlled with Nexium , so you should continue taking it as prescribed.  -ALLERGIC RHINITIS: Allergic rhinitis is an allergic reaction that causes sneezing, congestion, and a runny nose. We will switch your Zyrtec  to evening dosing to potentially improve your symptom control.  INSTRUCTIONS:  Please follow up after completing the chest x-ray and blood work. Continue taking Nexium  as prescribed and switch your Zyrtec  to the evening. Use the Arnuity inhaler once daily in the evening and rinse your mouth well after use.

## 2024-08-16 NOTE — Progress Notes (Signed)
 Full PFT completed today ? ?

## 2024-08-16 NOTE — Patient Instructions (Signed)
 Full PFT completed today ? ?

## 2024-08-16 NOTE — Progress Notes (Unsigned)
 Subjective:    Patient ID: Carla Berry, female    DOB: 11-12-1970, 53 y.o.   MRN: [id number]  Patient Care Team: McDiarmid, Krystal BIRCH, MD as PCP - General Kristie Lamprey, MD as Consulting Physician (Gastroenterology) Gaston Hamilton, MD as Consulting Physician (Urology) Danielle Rom, MD as Consulting Physician (Obstetrics and Gynecology)  No chief complaint on file.   BACKGROUND/INTERVAL:  HPI  DATA 09/20/2015 spirometry: FEV1 2.31 L or 82% predicted, FVC 2.84 L or 85% predicted, FEV1/FVC 81%.  FEF 25-75 2.41 l/s or 75% predicted.  No evidence of obstruction. 01/03/2024 cardiac CT: Coronary calcium score of 0.  Lung windows showed no abnormalities on the imaged tracheobronchial tree.  Mild dependent changes in the bilateral lungs.  No mass or consolidation, no pleural effusion or pneumothorax.  No suspicious lung nodules. 04/05/2024 spirometry pre and post: FEV1 2.0 L or 71% predicted, FVC 2.46 L or 70 % predicted, FEV1/FVC 81%.  There was no significant bronchodilator response.  Restriction suggested. Review of Systems A 10 point review of systems was performed and it is as noted above otherwise negative.   Patient Active Problem List   Diagnosis Date Noted  . Shortness of breath 04/05/2024  . Lipedema of lower extremity 03/18/2024  . Fibromyalgia 03/18/2024  . Venous insufficiency of both lower extremities 03/18/2024  . Varicose veins of both legs with edema 03/18/2024  . Prediabetes 03/18/2024  . Elevated serum alkaline phosphatase level 03/18/2024  . Fatty liver 03/02/2023  . Family history of colonic polyps 03/02/2023  . Plantar fasciitis, right 03/02/2023  . Essential hypertension 08/10/2020  . Iron deficiency 06/15/2018  . Urinary incontinence, mixed   . Functional dyspepsia 10/12/2015  . Personal history of vulvar dysplasia 11/30/2014  . Metabolic syndrome 11/17/2014  . Other chest pain 09/04/2010  . Severe obesity (BMI >= 40) (HCC) 12/03/2006  .  RHINITIS, ALLERGIC 12/03/2006  . GASTROESOPHAGEAL REFLUX, NO ESOPHAGITIS 12/03/2006    Social History   Tobacco Use  . Smoking status: Former    Current packs/day: 0.00    Average packs/day: 1 pack/day for 15.7 years (15.7 ttl pk-yrs)    Types: Cigarettes    Start date: 10/07/1987    Quit date: 07/07/2003    Years since quitting: 21.1  . Smokeless tobacco: Never  Substance Use Topics  . Alcohol use: No    Alcohol/week: 0.0 standard drinks of alcohol    Allergies  Allergen Reactions  . Codeine Nausea And Vomiting and Other (See Comments)    Causes Dizziness, passes out   . Prednisone Nausea And Vomiting    Severe nausea and vomiting for days    . Pregabalin  Other (See Comments)    Peripheral edema and sedation    Current Meds  Medication Sig  . Albuterol -Budesonide (AIRSUPRA) 90-80 MCG/ACT AERO Inhale 2 puffs into the lungs every 6 (six) hours as needed.  . amLODipine  (NORVASC ) 10 MG tablet TAKE 1 TABLET BY MOUTH EVERY DAY  . cetirizine  (ZYRTEC ) 10 MG tablet Take 10 mg by mouth daily.  . cyclobenzaprine  (FLEXERIL ) 5 MG tablet TAKE 1 TABLET BY MOUTH AT BEDTIME AS NEEDED FOR MUSCLE SPASMS.  . esomeprazole  (NEXIUM ) 40 MG capsule TAKE 1 CAPSULE (40 MG TOTAL) BY MOUTH DAILY.  . triamterene -hydrochlorothiazide  (MAXZIDE-25) 37.5-25 MG tablet TAKE 1/2 TABLET BY MOUTH DAILY    Immunization History  Administered Date(s) Administered  . Influenza Split 07/01/2012  . Influenza Whole 08/04/2007, 06/30/2008, 07/09/2009, 07/12/2010  . Influenza,inj,Quad PF,6+ Mos 07/11/2013, 07/13/2014, 10/11/2015, 07/28/2018, 07/25/2019, 08/16/2020,  10/04/2021  . Influenza-Unspecified 09/06/2016, 09/07/2017, 07/28/2018  . PFIZER(Purple Top)SARS-COV-2 Vaccination 02/04/2020, 03/09/2020, 09/25/2020  . Td 03/07/2003  . Tdap 04/28/2013, 10/15/2022        Objective:     LMP 04/16/2023      GENERAL: Obese woman, no acute distress.  Fully ambulatory, no conversational dyspnea. HEAD:  Normocephalic, atraumatic.  EYES: Pupils equal, round, reactive to light.  No scleral icterus.  MOUTH: Dentition intact, oral mucosa moist.  No thrush. NECK: Supple. No thyromegaly. Trachea midline. No JVD.  No adenopathy. PULMONARY: Good air entry bilaterally.  No adventitious sounds. CARDIOVASCULAR: S1 and S2. Regular rate and rhythm.  No rubs, murmurs or gallops heard. ABDOMEN: Obese otherwise benign MUSCULOSKELETAL: No joint deformity, no clubbing, no edema.  NEUROLOGIC: No overt focal deficit, no gait disturbance, speech is fluent. SKIN: Intact,warm,dry. PSYCH: Mood and behavior normal.        Assessment & Plan:   No diagnosis found.  No orders of the defined types were placed in this encounter.   No orders of the defined types were placed in this encounter.     Advised if symptoms do not improve or worsen, to please contact office for sooner follow up or seek emergency care.    I spent xxx minutes of dedicated to the care of this patient on the date of this encounter to include pre-visit review of records, face-to-face time with the patient discussing conditions above, post visit ordering of testing, clinical documentation with the electronic health record, making appropriate referrals as documented, and communicating necessary findings to members of the patients care team.     C. Leita Sanders, MD Advanced Bronchoscopy PCCM [facility]-    *This note was generated using voice recognition software/Dragon and/or AI transcription program.  Despite best efforts to proofread, errors can occur which can change the meaning. Any transcriptional errors that result from this process are unintentional and may not be fully corrected at the time of dictation.

## 2024-08-17 ENCOUNTER — Encounter: Payer: Self-pay | Admitting: Pulmonary Disease

## 2024-08-17 ENCOUNTER — Other Ambulatory Visit (HOSPITAL_COMMUNITY): Payer: Self-pay

## 2024-08-17 ENCOUNTER — Telehealth: Payer: Self-pay

## 2024-08-17 NOTE — Telephone Encounter (Signed)
 That is what I wrote for.  Will send prescription again.

## 2024-08-17 NOTE — Telephone Encounter (Signed)
*  Pulm  Pharmacy Patient Advocate Encounter   Received notification from CoverMyMeds that prior authorization for Fluticasone  Furoate Ellipta 100MCG/ACT aerosol powder  is required/requested.   Insurance verification completed.   The patient is insured through [facility].   Per test claim:  Brand Arnuity Ellipta is preferred by the insurance.  If suggested medication is appropriate, Please send in a new RX and discontinue this one. If not, please advise as to why it's not appropriate so that we may request a Prior Authorization. Please note, some preferred medications may still require a PA.  If the suggested medications have not been trialed and there are no contraindications to their use, the PA will not be submitted, as it will not be approved.   Brand Arnuity Ellipta- $25.00

## 2024-08-17 NOTE — Telephone Encounter (Signed)
 Noted. Nothing further needed.

## 2024-08-18 ENCOUNTER — Ambulatory Visit: Payer: Self-pay | Admitting: Pulmonary Disease

## 2024-08-19 MED ORDER — ARNUITY ELLIPTA 100 MCG/ACT IN AEPB: 1.0000 | INHALATION_SPRAY | Freq: Every day | RESPIRATORY_TRACT | 6 refills | Status: AC

## 2024-08-19 NOTE — Addendum Note (Signed)
 Addended by: TAMEA DEDRA CROME on: 08/19/2024 09:30 AM   Modules accepted: Orders

## 2024-08-21 ENCOUNTER — Encounter: Payer: Self-pay | Admitting: Family Medicine

## 2024-08-21 DIAGNOSIS — J45991 Cough variant asthma: Secondary | ICD-10-CM | POA: Insufficient documentation

## 2024-08-21 DIAGNOSIS — J383 Other diseases of vocal cords: Secondary | ICD-10-CM | POA: Insufficient documentation

## 2024-08-29 LAB — GENECONNECT MOLECULAR SCREEN: Genetic Analysis Overall Interpretation: NEGATIVE

## 2024-09-13 DIAGNOSIS — F4310 Post-traumatic stress disorder, unspecified: Secondary | ICD-10-CM | POA: Diagnosis not present

## 2024-10-18 ENCOUNTER — Encounter: Payer: Self-pay | Admitting: Pulmonary Disease

## 2024-10-18 ENCOUNTER — Ambulatory Visit: Admitting: Pulmonary Disease

## 2024-10-18 VITALS — BP 118/82 | HR 98 | Temp 97.9°F | Ht 64.0 in | Wt 267.4 lb

## 2024-10-18 DIAGNOSIS — R058 Other specified cough: Secondary | ICD-10-CM

## 2024-10-18 DIAGNOSIS — Z87891 Personal history of nicotine dependence: Secondary | ICD-10-CM

## 2024-10-18 DIAGNOSIS — J45991 Cough variant asthma: Secondary | ICD-10-CM | POA: Diagnosis not present

## 2024-10-18 NOTE — Patient Instructions (Signed)
 VISIT SUMMARY:  During your visit, we discussed your persistent cough and its improvement with the use of your inhaler. We also reviewed your history of cough variant asthma and the recent viral infection that worsened your symptoms.  YOUR PLAN:  -COUGH VARIANT ASTHMA: Cough variant asthma is a type of asthma where coughing is the main symptom instead of wheezing. Your symptoms have improved with the use of the Arnuity inhaler, which has reduced mucus production and improved your breathing. Continue using the Arnuity inhaler at the current dose. If your symptoms worsen, we may consider increasing the inhaler strength.  -POST-VIRAL COUGH SYNDROME: Post-viral cough syndrome is a persistent cough following a viral infection. Your symptoms have returned to their baseline state before treatment, with reduced mucus production. This type of cough typically lasts about eight weeks. Continue with your current management as your symptoms are expected to resolve within this timeframe.

## 2024-10-18 NOTE — Progress Notes (Unsigned)
 "  Subjective:    Patient ID: Carla Berry, female    DOB: Feb 02, 1971, 54 y.o.   MRN: [id number]  Patient Care Team: McDiarmid, Krystal BIRCH, MD as PCP - General Carla Lamprey, MD as Consulting Physician (Gastroenterology) Carla Hamilton, MD as Consulting Physician (Urology) Carla Rom, MD as Consulting Physician (Obstetrics and Gynecology) Carla Dedra CROME, MD as Consulting Physician (Pulmonary Disease)  Chief Complaint  Patient presents with   Follow-up    Asthma cold    BACKGROUND/INTERVAL:Patient is a 54 year old former smoker with a 15-pack-year history of smoking, and a history as noted below who presents for follow-up of shortness of breath and cough productive of yellowish to brown sputum for approximately 3 years duration.  She was last seen on 16 August 2024. HPI Discussed the use of AI scribe software for clinical note transcription with the patient, who gave verbal consent to proceed.  History of Present Illness   Carla Berry is a 54 year old female with cough variant asthma who presents with persistent cough.  She has been experiencing a persistent cough that initially improved but worsened following a viral infection around Christmas. The cough has then returned to its previous state before treatment however, now it is subsiding again.  She is currently using Arnuity, an inhaler, which she finds helpful. She has started taking it at night and notes a reduction in morning mucus production. Before her recent illness, she experienced mucus two to three mornings a week, but now only twice this week, which she considers an improvement.  She has a history of cough variant asthma, which causes her to cough instead of wheeze. Viral infections tend to trigger her symptoms.  She reports improvement in her shortness of breath, noting that she no longer gets out of breath walking from the parking lot to the clinic, which she attributes to the inhaler.  Previous scans showed airway inflammation.      DATA 09/20/2015 spirometry: FEV1 2.31 L or 82% predicted, FVC 2.84 L or 85% predicted, FEV1/FVC 81%.  FEF 25-75 2.41 l/s or 75% predicted.  No evidence of obstruction. 01/03/2024 cardiac CT: Coronary calcium score of 0.  Lung windows showed no abnormalities on the imaged tracheobronchial tree.  Mild dependent changes in the bilateral lungs.  No mass or consolidation, no pleural effusion or pneumothorax.  No suspicious lung nodules. 04/05/2024 spirometry pre and post: FEV1 2.0 L or 71% predicted, FVC 2.46 L or 70 % predicted, FEV1/FVC 81%.  There was no significant bronchodilator response.  Restriction suggested. 07/06/2024 nitric oxide : 15 ppb, no evidence of type II inflammation. 08/16/2024 PFTs: FEV1 of 2.07 L or 74% predicted, FVC of 2.47 L or 70% predicted, FEV1/FVC of 84%, lung volumes normal with ERV low at 11% likely due to obesity (BMI 46.2), no bronchodilator response.  Diffusion capacity normal.  Flow-volume loop consistent with vocal cord dysfunction.  Review of Systems A 10 point review of systems was performed and it is as noted above otherwise negative.   Patient Active Problem List   Diagnosis Date Noted   Cough variant asthma 08/21/2024   Vocal cord dysfunction 08/21/2024   Shortness of breath 04/05/2024   Lipedema of lower extremity 03/18/2024   Fibromyalgia 03/18/2024   Venous insufficiency of both lower extremities 03/18/2024   Varicose veins of both legs with edema 03/18/2024   Prediabetes 03/18/2024   Elevated serum alkaline phosphatase level 03/18/2024   Fatty liver 03/02/2023   Family history of colonic polyps 03/02/2023  Plantar fasciitis, right 03/02/2023   Essential hypertension 08/10/2020   Iron deficiency 06/15/2018   Urinary incontinence, mixed    Chronic cough 10/12/2015   Functional dyspepsia 10/12/2015   Personal history of vulvar dysplasia 11/30/2014   Metabolic syndrome 11/17/2014   Other chest pain  09/04/2010   Severe obesity (BMI >= 40) (HCC) 12/03/2006   RHINITIS, ALLERGIC 12/03/2006   GASTROESOPHAGEAL REFLUX, NO ESOPHAGITIS 12/03/2006    Social History   Tobacco Use   Smoking status: Former    Current packs/day: 0.00    Average packs/day: 1 pack/day for 15.7 years (15.7 ttl pk-yrs)    Types: Cigarettes    Start date: 10/07/1987    Quit date: 07/07/2003    Years since quitting: 21.3   Smokeless tobacco: Never  Substance Use Topics   Alcohol use: No    Alcohol/week: 0.0 standard drinks of alcohol    Allergies[1]  Active Medications[2]  Immunization History  Administered Date(s) Administered   Influenza Inj Mdck Quad Pf 09/06/2016   Influenza Split 07/01/2012   Influenza Whole 08/04/2007, 06/30/2008, 07/09/2009, 07/12/2010   Influenza, Mdck, Trivalent,PF 6+ MOS(egg free) 08/18/2023   Influenza, Seasonal, Injecte, Preservative Fre 08/20/2024   Influenza,inj,Quad PF,6+ Mos 07/11/2013, 07/13/2014, 10/11/2015, 07/28/2018, 07/25/2019, 08/16/2020, 10/04/2021   Influenza-Unspecified 09/06/2016, 09/07/2017, 07/28/2018, 08/20/2022   PFIZER(Purple Top)SARS-COV-2 Vaccination 02/04/2020, 03/09/2020, 09/25/2020   Td 03/07/2003   Tdap 04/28/2013, 10/15/2022   Zoster Recombinant(Shingrix) 08/18/2023, 11/09/2023        Objective:     Vitals:   10/18/24 1556  BP: 118/82  Pulse: 98  Temp: 97.9 F (36.6 C)  Height: 5' 4 (1.626 m)  Weight: 267 lb 6 oz (121.3 kg)  SpO2: 97%  BMI (Calculated): 45.87     SpO2: 97 %  GENERAL: Obese woman, no acute distress.  Fully ambulatory, no conversational dyspnea. HEAD: Normocephalic, atraumatic.  EYES: Pupils equal, round, reactive to light.  No scleral icterus.  MOUTH: Dentures upper and lower, oral mucosa moist.  No thrush. NECK: Supple. No thyromegaly. Trachea midline. No JVD.  No adenopathy. PULMONARY: Good air entry bilaterally.  No adventitious sounds. CARDIOVASCULAR: S1 and S2. Regular rate and rhythm.  No rubs, murmurs or  gallops heard. ABDOMEN: Obese otherwise benign MUSCULOSKELETAL: No joint deformity, no clubbing, no edema.  NEUROLOGIC: No overt focal deficit, no gait disturbance, speech is fluent. SKIN: Intact,warm,dry. PSYCH: Mood and behavior normal.        Assessment & Plan:     ICD-10-CM   1. Cough variant asthma  J45.991     2. Post-viral cough syndrome  R05.8      Discussion:    Cough variant asthma Chronic cough variant asthma with improvement in symptoms. Shortness of breath has improved, likely due to current inhaler therapy. Airway inflammation noted on scans, which may be reduced with treatment. Current treatment with Arnuity is effective, with reduced mucus production and improved breathing. - Continue Arnuity inhaler at current dose. - Will consider increasing inhaler strength if symptoms worsen.  Post-viral cough syndrome Following recent viral infection, with symptoms persisting but improving. Cough has returned to baseline prior to treatment, with reduced mucus production. Expected duration of post-viral cough is approximately eight weeks. - Continue current management as symptoms are expected to resolve within eight weeks.     Follow-up in 4 months time.  Advised if symptoms do not improve or worsen, to please contact office for sooner follow up or seek emergency care.    I spent 20 minutes of dedicated to  the care of this patient on the date of this encounter to include pre-visit review of records, face-to-face time with the patient discussing conditions above, post visit ordering of testing, clinical documentation with the electronic health record, making appropriate referrals as documented, and communicating necessary findings to members of the patients care team.     C. Leita Sanders, MD Advanced Bronchoscopy PCCM [facility]    *This note was generated using voice recognition software/Dragon and/or AI transcription program.  Despite best efforts  to proofread, errors can occur which can change the meaning. Any transcriptional errors that result from this process are unintentional and may not be fully corrected at the time of dictation.     [1]  Allergies Allergen Reactions   Codeine Nausea And Vomiting and Other (See Comments)    Causes Dizziness, passes out    Prednisone Nausea And Vomiting    Severe nausea and vomiting for days     Pregabalin  Other (See Comments)    Peripheral edema and sedation  [2]  Current Meds  Medication Sig   Albuterol -Budesonide (AIRSUPRA ) 90-80 MCG/ACT AERO Inhale 2 puffs into the lungs every 6 (six) hours as needed.   amLODipine  (NORVASC ) 10 MG tablet TAKE 1 TABLET BY MOUTH EVERY DAY   ARNUITY ELLIPTA  100 MCG/ACT AEPB Inhale 1 puff into the lungs daily.   cetirizine  (ZYRTEC ) 10 MG tablet Take 10 mg by mouth daily.   cyclobenzaprine  (FLEXERIL ) 5 MG tablet TAKE 1 TABLET BY MOUTH AT BEDTIME AS NEEDED FOR MUSCLE SPASMS.   esomeprazole  (NEXIUM ) 40 MG capsule TAKE 1 CAPSULE (40 MG TOTAL) BY MOUTH DAILY.   triamterene -hydrochlorothiazide  (MAXZIDE-25) 37.5-25 MG tablet TAKE 1/2 TABLET BY MOUTH DAILY   "

## 2025-02-16 ENCOUNTER — Ambulatory Visit: Admitting: Pulmonary Disease
